# Patient Record
Sex: Female | Born: 1935
Health system: Southern US, Community
[De-identification: ages and names within clinical notes are randomized; demographics above are authoritative.]

## PROBLEM LIST (undated history)

## (undated) DIAGNOSIS — R0789 Other chest pain: Secondary | ICD-10-CM

## (undated) DIAGNOSIS — T4145XA Adverse effect of unspecified anesthetic, initial encounter: Secondary | ICD-10-CM

## (undated) DIAGNOSIS — I10 Essential (primary) hypertension: Secondary | ICD-10-CM

## (undated) DIAGNOSIS — R202 Paresthesia of skin: Secondary | ICD-10-CM

## (undated) DIAGNOSIS — K219 Gastro-esophageal reflux disease without esophagitis: Secondary | ICD-10-CM

## (undated) DIAGNOSIS — Z2989 Encounter for other specified prophylactic measures: Secondary | ICD-10-CM

## (undated) DIAGNOSIS — R011 Cardiac murmur, unspecified: Secondary | ICD-10-CM

## (undated) DIAGNOSIS — N904 Leukoplakia of vulva: Secondary | ICD-10-CM

## (undated) DIAGNOSIS — F419 Anxiety disorder, unspecified: Secondary | ICD-10-CM

## (undated) DIAGNOSIS — M810 Age-related osteoporosis without current pathological fracture: Secondary | ICD-10-CM

## (undated) DIAGNOSIS — J309 Allergic rhinitis, unspecified: Secondary | ICD-10-CM

## (undated) DIAGNOSIS — Z298 Encounter for other specified prophylactic measures: Secondary | ICD-10-CM

## (undated) DIAGNOSIS — T8859XA Other complications of anesthesia, initial encounter: Secondary | ICD-10-CM

## (undated) DIAGNOSIS — G250 Essential tremor: Secondary | ICD-10-CM

## (undated) HISTORY — DX: Leukoplakia of vulva: N90.4

## (undated) HISTORY — DX: Encounter for other specified prophylactic measures: Z29.8

## (undated) HISTORY — DX: Anxiety disorder, unspecified: F41.9

## (undated) HISTORY — DX: Paresthesia of skin: R20.2

## (undated) HISTORY — DX: Age-related osteoporosis without current pathological fracture: M81.0

## (undated) HISTORY — DX: Other chest pain: R07.89

## (undated) HISTORY — DX: Gastro-esophageal reflux disease without esophagitis: K21.9

## (undated) HISTORY — DX: Encounter for other specified prophylactic measures: Z29.89

## (undated) HISTORY — DX: Essential tremor: G25.0

## (undated) HISTORY — DX: Allergic rhinitis, unspecified: J30.9

## (undated) HISTORY — PX: OTHER SURGICAL HISTORY: SHX169

---

## 1966-04-01 HISTORY — PX: ABDOMINAL HYSTERECTOMY: SHX81

## 1967-04-02 DIAGNOSIS — Z90710 Acquired absence of both cervix and uterus: Secondary | ICD-10-CM | POA: Insufficient documentation

## 2001-04-01 HISTORY — PX: CORRECTION HAMMER TOE: SUR317

## 2002-04-02 LAB — HM COLONOSCOPY: HM COLON: NORMAL

## 2004-11-27 ENCOUNTER — Ambulatory Visit: Payer: Self-pay | Admitting: Family Medicine

## 2005-11-29 ENCOUNTER — Ambulatory Visit: Payer: Self-pay | Admitting: Family Medicine

## 2005-12-11 ENCOUNTER — Ambulatory Visit: Payer: Self-pay | Admitting: Family Medicine

## 2006-03-18 ENCOUNTER — Ambulatory Visit: Payer: Self-pay | Admitting: Family Medicine

## 2007-03-02 HISTORY — PX: ASD REPAIR, SECUNDUM: SHX1195

## 2007-07-21 ENCOUNTER — Ambulatory Visit: Payer: Self-pay | Admitting: Family Medicine

## 2008-08-15 DIAGNOSIS — R011 Cardiac murmur, unspecified: Secondary | ICD-10-CM | POA: Insufficient documentation

## 2008-08-15 DIAGNOSIS — I10 Essential (primary) hypertension: Secondary | ICD-10-CM | POA: Insufficient documentation

## 2008-08-19 DIAGNOSIS — M899 Disorder of bone, unspecified: Secondary | ICD-10-CM | POA: Insufficient documentation

## 2008-09-01 ENCOUNTER — Ambulatory Visit: Payer: Self-pay | Admitting: Family Medicine

## 2009-09-07 ENCOUNTER — Ambulatory Visit: Payer: Self-pay | Admitting: Family Medicine

## 2009-09-13 ENCOUNTER — Ambulatory Visit: Payer: Self-pay | Admitting: Family Medicine

## 2010-02-28 ENCOUNTER — Ambulatory Visit: Payer: Self-pay | Admitting: Family Medicine

## 2010-09-10 ENCOUNTER — Ambulatory Visit: Payer: Self-pay | Admitting: Family Medicine

## 2011-09-25 ENCOUNTER — Ambulatory Visit: Payer: Self-pay | Admitting: Family Medicine

## 2012-02-03 ENCOUNTER — Ambulatory Visit: Payer: Self-pay | Admitting: Family Medicine

## 2012-02-18 ENCOUNTER — Ambulatory Visit: Payer: Self-pay | Admitting: Family Medicine

## 2012-06-30 HISTORY — PX: CATARACT EXTRACTION: SUR2

## 2012-07-20 ENCOUNTER — Ambulatory Visit: Payer: Self-pay | Admitting: Ophthalmology

## 2012-07-20 LAB — POTASSIUM: Potassium: 3.8 mmol/L (ref 3.5–5.1)

## 2012-07-27 ENCOUNTER — Ambulatory Visit: Payer: Self-pay | Admitting: Ophthalmology

## 2012-08-05 ENCOUNTER — Ambulatory Visit: Payer: Self-pay | Admitting: Ophthalmology

## 2012-08-05 LAB — POTASSIUM: Potassium: 3.5 mmol/L (ref 3.5–5.1)

## 2012-08-10 ENCOUNTER — Ambulatory Visit: Payer: Self-pay | Admitting: Ophthalmology

## 2012-09-06 LAB — CBC AND DIFFERENTIAL: Platelets: 247 10*3/uL (ref 150–399)

## 2012-09-29 ENCOUNTER — Ambulatory Visit: Payer: Self-pay | Admitting: Family Medicine

## 2012-10-28 ENCOUNTER — Emergency Department: Payer: Self-pay | Admitting: Emergency Medicine

## 2012-10-28 LAB — URINALYSIS, COMPLETE
Bacteria: NONE SEEN
Bilirubin,UR: NEGATIVE
Ketone: NEGATIVE
Nitrite: NEGATIVE
Ph: 8 (ref 4.5–8.0)
Protein: NEGATIVE
Squamous Epithelial: <1
WBC UR: <1 /HPF (ref 0–5)

## 2012-10-28 LAB — COMPREHENSIVE METABOLIC PANEL
Albumin: 3.7 g/dL (ref 3.4–5.0)
Alkaline Phosphatase: 82 U/L (ref 50–136)
Anion Gap: 7 (ref 7–16)
BUN: 15 mg/dL (ref 7–18)
Bilirubin,Total: 0.6 mg/dL (ref 0.2–1.0)
Calcium, Total: 9.1 mg/dL (ref 8.5–10.1)
Chloride: 102 mmol/L (ref 98–107)
Co2: 27 mmol/L (ref 21–32)
Creatinine: 0.93 mg/dL (ref 0.60–1.30)
EGFR (African American): >60
EGFR (Non-African Amer.): 58 — ABNORMAL LOW
Glucose: 189 mg/dL — ABNORMAL HIGH (ref 65–99)
Osmolality: 278 (ref 275–301)
Potassium: 3.2 mmol/L — ABNORMAL LOW (ref 3.5–5.1)
SGOT(AST): 28 U/L (ref 15–37)
SGPT (ALT): 17 U/L (ref 12–78)
Sodium: 136 mmol/L (ref 136–145)
Total Protein: 7.3 g/dL (ref 6.4–8.2)

## 2012-10-28 LAB — CBC
HCT: 40.8 % (ref 35.0–47.0)
HGB: 14.1 g/dL (ref 12.0–16.0)
MCH: 31.7 pg (ref 26.0–34.0)
MCHC: 34.6 g/dL (ref 32.0–36.0)
Platelet: 255 10*3/uL (ref 150–440)
RBC: 4.45 10*6/uL (ref 3.80–5.20)
RDW: 13.1 % (ref 11.5–14.5)
WBC: 5 10*3/uL (ref 3.6–11.0)

## 2013-02-21 ENCOUNTER — Emergency Department: Payer: Self-pay | Admitting: Emergency Medicine

## 2013-02-21 LAB — URINALYSIS, COMPLETE
Bacteria: NONE SEEN
Bilirubin,UR: NEGATIVE
Blood: NEGATIVE
Glucose,UR: NEGATIVE mg/dL (ref 0–75)
Ph: 7 (ref 4.5–8.0)
Protein: NEGATIVE
RBC,UR: 1 /HPF (ref 0–5)
Specific Gravity: 1.008 (ref 1.003–1.030)
Squamous Epithelial: NONE SEEN
WBC UR: <1 /HPF (ref 0–5)

## 2013-02-21 LAB — COMPREHENSIVE METABOLIC PANEL
Alkaline Phosphatase: 79 U/L
BUN: 18 mg/dL (ref 7–18)
Co2: 27 mmol/L (ref 21–32)
Glucose: 141 mg/dL — ABNORMAL HIGH (ref 65–99)
Potassium: 3.6 mmol/L (ref 3.5–5.1)
SGOT(AST): 40 U/L — ABNORMAL HIGH (ref 15–37)
SGPT (ALT): 17 U/L (ref 12–78)

## 2013-02-21 LAB — CBC
HCT: 42.9 % (ref 35.0–47.0)
MCH: 31.4 pg (ref 26.0–34.0)
MCHC: 34.3 g/dL (ref 32.0–36.0)
Platelet: 240 10*3/uL (ref 150–440)
RBC: 4.69 10*6/uL (ref 3.80–5.20)
WBC: 4.3 10*3/uL (ref 3.6–11.0)

## 2013-02-21 LAB — TROPONIN I: Troponin-I: <0.02 ng/mL

## 2013-09-06 LAB — BASIC METABOLIC PANEL
BUN: 19 mg/dL (ref 4–21)
Creatinine: 0.9 mg/dL (ref <=1.1)
Glucose: 120 mg/dL
POTASSIUM: 3.8 mmol/L (ref 3.4–5.3)
Sodium: 143 mmol/L (ref 137–147)

## 2013-09-06 LAB — CBC AND DIFFERENTIAL
HEMATOCRIT: 42 % (ref 36–46)
Hemoglobin: 14.3 g/dL (ref 12.0–16.0)
WBC: 4.7 10^3/mL

## 2013-09-06 LAB — LIPID PANEL
CHOLESTEROL: 185 mg/dL (ref 0–200)
HDL: 54 mg/dL (ref 35–70)
LDL CALC: 120 mg/dL
Triglycerides: 57 mg/dL (ref 40–160)

## 2013-09-06 LAB — HEPATIC FUNCTION PANEL
ALT: 8 U/L (ref 7–35)
AST: 19 U/L (ref 13–35)

## 2013-09-27 LAB — HM DEXA SCAN

## 2013-10-03 LAB — HM MAMMOGRAPHY

## 2013-10-04 ENCOUNTER — Ambulatory Visit: Payer: Self-pay | Admitting: Family Medicine

## 2014-01-24 IMAGING — CR DG CHEST 2V
1 series · 2 of 2 positions shown · non-contrast
Comparison: none

REASON FOR EXAM: cough
COMMENTS:

PROCEDURE:     KDR - KDXR CHEST PA (OR AP) AND LAT  - February 03, 2012  [DATE]
RESULT:

[Series 1: pa · 0.17mm/px · 2 of 2 slices shown]
[im 1/2]
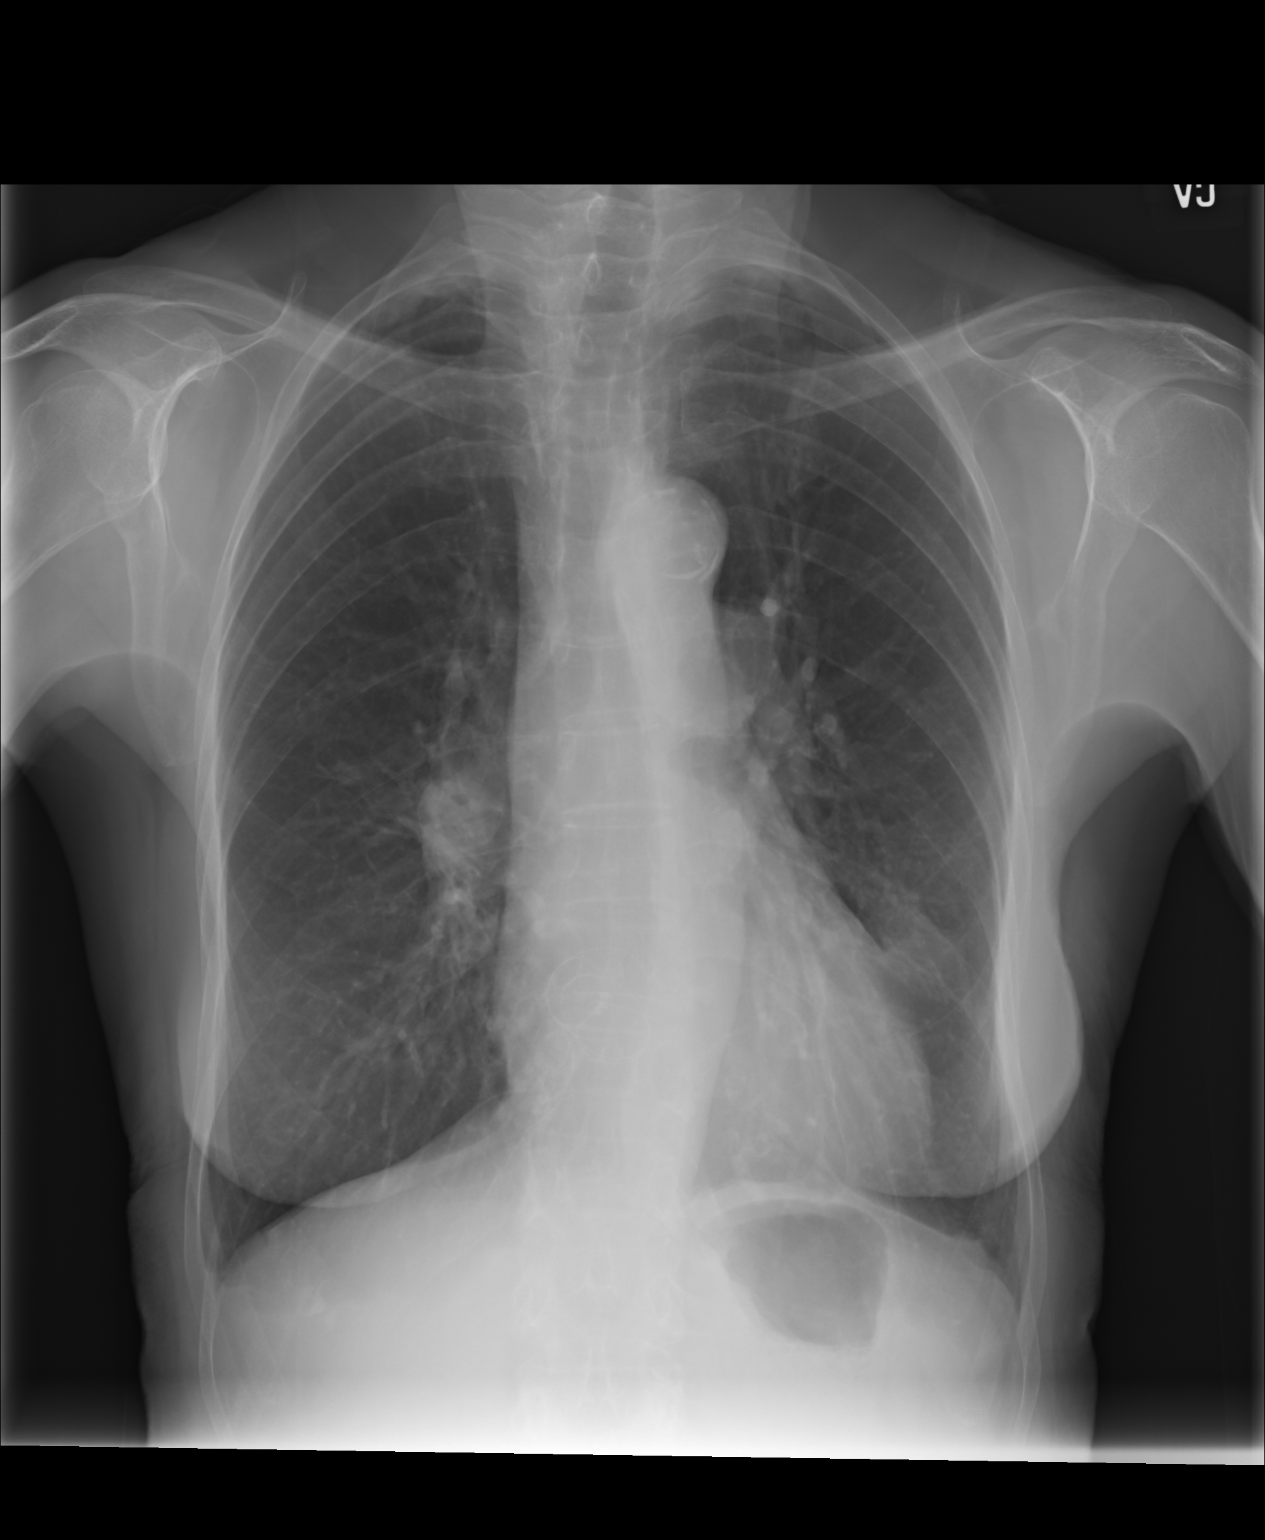
[im 2/2]
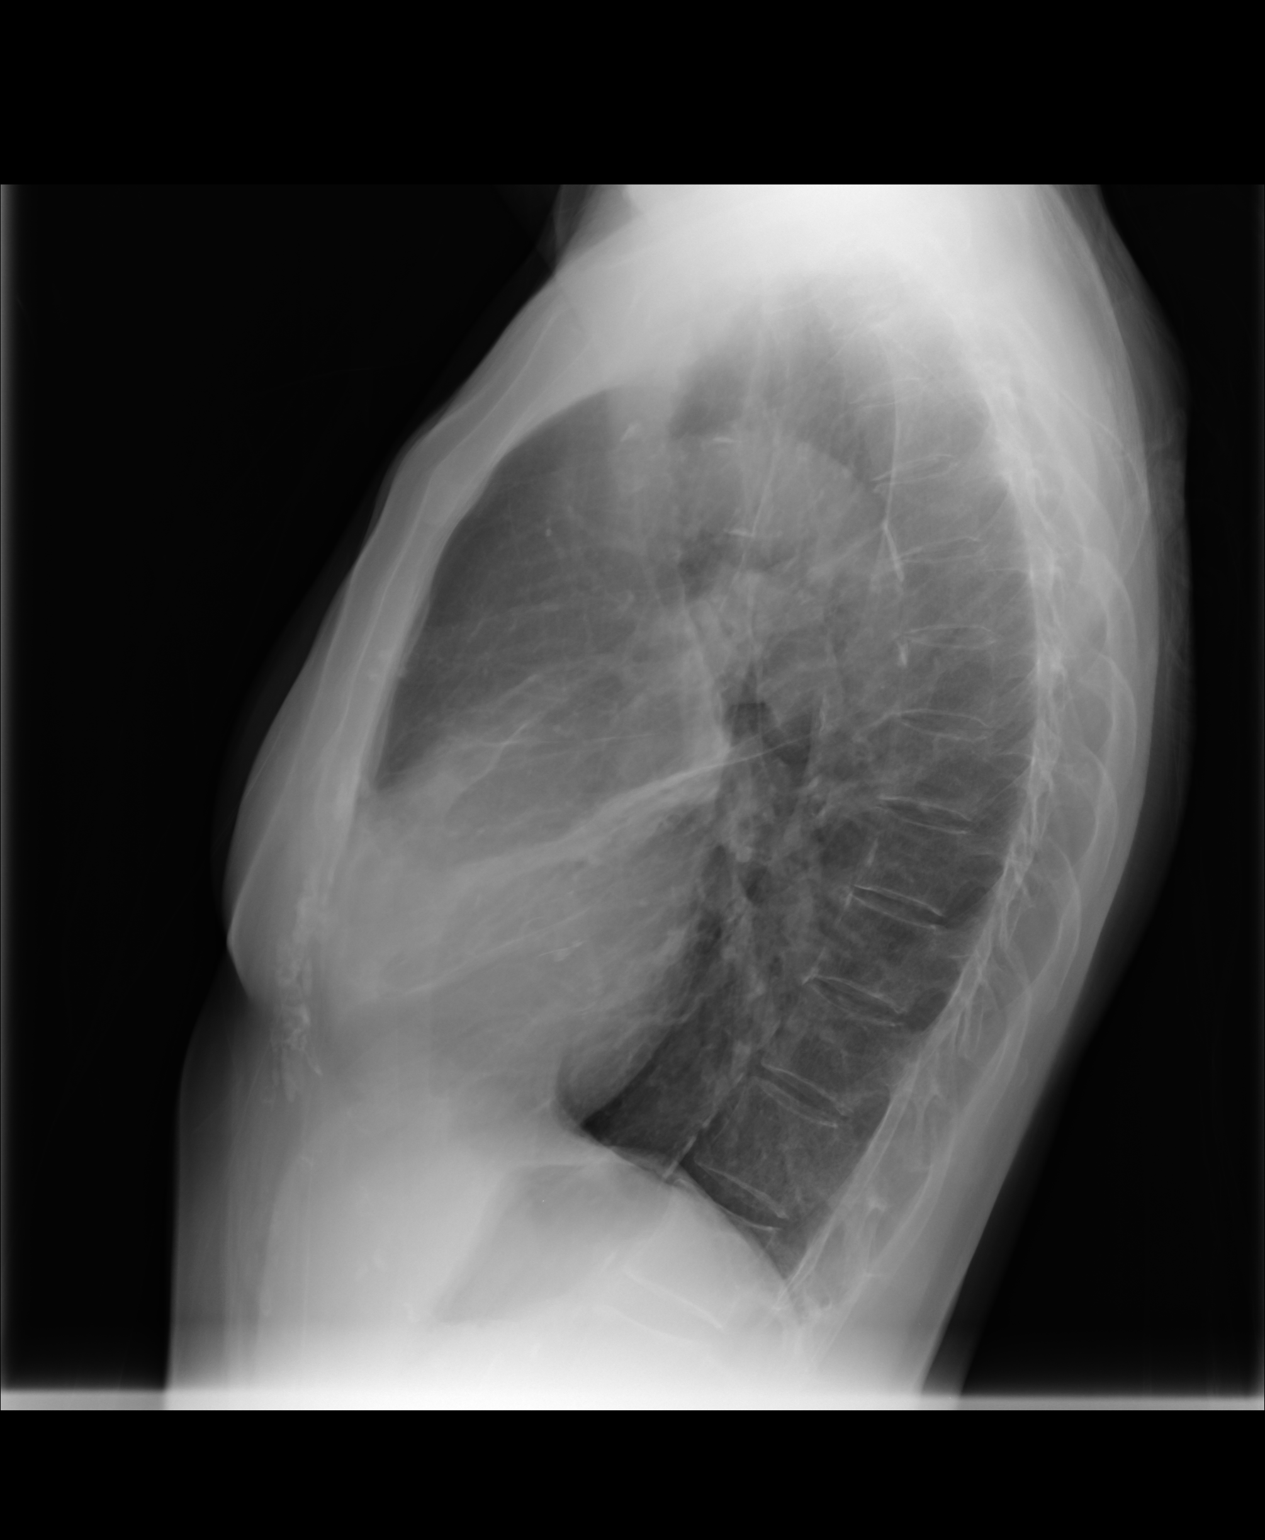

[2 of 2 positions shown; findings below may reference images not displayed]

FINDINGS: There is flattening of the hemidiaphragms. An area of nodular
increased density projects within the right hilar region. There is
prominence of the pulmonary artery on the left. An area of increased density
projects in the region of the lingula. The cardiac silhouette is within
normal limits. The visualized bony skeleton is unremarkable.
IMPRESSION: 1. Area of increased nodular density projecting in the right hilar region.
This may represent prominent vasculature though adenopathy or mass cannot be
excluded.
2. Atelectasis versus infiltrate in the region of the lingula. A repeat
evaluation in 10 to 14 days or sooner is recommended. If these findings
persists status post appropriate therapeutic regimen, further evaluation
with chest CT is recommended. In the absence of clinical signs of infection
or inflammatory etiologies, Chest CT is recommended.

## 2014-02-02 LAB — TSH: TSH: 1.23 u[IU]/mL (ref <=5.90)

## 2014-02-02 LAB — HEMOGLOBIN A1C: HEMOGLOBIN A1C: 5.4 % (ref 4.0–6.0)

## 2014-07-21 LAB — HM PAP SMEAR

## 2014-07-22 DIAGNOSIS — F419 Anxiety disorder, unspecified: Secondary | ICD-10-CM | POA: Insufficient documentation

## 2014-07-22 DIAGNOSIS — G250 Essential tremor: Secondary | ICD-10-CM | POA: Insufficient documentation

## 2014-07-22 DIAGNOSIS — J309 Allergic rhinitis, unspecified: Secondary | ICD-10-CM | POA: Insufficient documentation

## 2014-07-22 DIAGNOSIS — K219 Gastro-esophageal reflux disease without esophagitis: Secondary | ICD-10-CM | POA: Insufficient documentation

## 2014-07-22 DIAGNOSIS — M810 Age-related osteoporosis without current pathological fracture: Secondary | ICD-10-CM | POA: Insufficient documentation

## 2014-07-22 NOTE — Op Note (Signed)
PATIENT NAME:  Susan NeighborsHOMPSON, Susan P MR#:  [id number] DATE OF BIRTH:  15-Jul-1930  DATE OF PROCEDURE:  07/27/2012  PREOPERATIVE DIAGNOSIS:  Cataract, left eye.    POSTOPERATIVE DIAGNOSIS:  Cataract, left eye.  PROCEDURE PERFORMED:  Extracapsular cataract extraction using phacoemulsification with placement of an Alcon [id number], 26.0-diopter posterior chamber lens, serial [id number].  SURGEON:  Maylon PeppersSteven A. Kelani Robart, MD  ASSISTANT:  None.  ANESTHESIA:  4% lidocaine and 0.75% Marcaine in a 50/50 mixture with 10 units/mL of Hylenex added, given as a peribulbar.   ANESTHESIOLOGIST:  Dr. Pernell DupreAdams.  COMPLICATIONS:  None.  ESTIMATED BLOOD LOSS:  Less than 1 ml.  DESCRIPTION OF PROCEDURE:  The patient was brought to the operating room and given a peribulbar block.  The patient was then prepped and draped in the usual fashion.  The vertical rectus muscles were imbricated using 5-0 silk sutures.  These sutures were then clamped to the sterile drapes as bridle sutures.  A limbal peritomy was performed extending two clock hours and hemostasis was obtained with cautery.  A partial thickness scleral groove was made at the surgical limbus and dissected anteriorly in a lamellar dissection using an Alcon crescent knife.  The anterior chamber was entered supero-temporally with a Superblade and through the lamellar dissection with a 2.6 mm keratome.  DisCoVisc was used to replace the aqueous and a continuous tear capsulorrhexis was carried out.  Hydrodissection and hydrodelineation were carried out with balanced salt and a 27 gauge canula.  The nucleus was rotated to confirm the effectiveness of the hydrodissection.  Phacoemulsification was carried out using a divide-and-conquer technique.  Total ultrasound time was 46 seconds with an average power of 23.8 percent and CDE of 20.76.  Irrigation/aspiration was used to remove the residual cortex.  DisCoVisc was used to inflate the capsule and the internal incision was  enlarged to 3 mm with the crescent knife.  The intraocular lens was folded and inserted into the capsular bag using the AcrySert delivery system. Irrigation/aspiration was used to remove the residual DisCoVisc.  Miostat was injected into the anterior chamber through the paracentesis track to inflate the anterior chamber and induce miosis.  A tenth of a milliliter of cefuroxime was placed into the anterior chamber via the paracentesis tract. The wound was checked for leaks and none were found. The conjunctiva was closed with cautery and the bridle sutures were removed.  Two drops of 0.3% Vigamox were placed on the eye.   An eye shield was placed on the eye.  The patient was discharged to the recovery room in good condition. ____________________________ Maylon PeppersSteven A. Lailynn Southgate, MD sad:sb D: 07/27/2012 14:03:13 ET T: 07/27/2012 14:45:04 ET JOB#: [id number]  cc: Viviann SpareSteven A. Yazeed Pryer, MD, <Dictator> Erline LevineSTEVEN A Brees Hounshell MD ELECTRONICALLY SIGNED 08/03/2012 9:47

## 2014-07-22 NOTE — Op Note (Signed)
PATIENT NAME:  Susan NeighborsHOMPSON, Lynnex P MR#:  [id number] DATE OF BIRTH:  1931-03-21  DATE OF PROCEDURE:  08/10/2012  PREOPERATIVE DIAGNOSIS: Cataract, right eye.   POSTOPERATIVE DIAGNOSIS: Cataract, right eye.   PROCEDURE PERFORMED: Extracapsular cataract extraction using phacoemulsification with placement Alcon [id number], 23.0 diopter posterior chamber lens, serial #[id number].   ANESTHESIA: 4% lidocaine and 0.75% Marcaine in a 50-50 mixture with 10 units per mL of Hylenex added, given as a peribulbar.   SURGEON: Dr. Viviann SpareSteven A. Laityn Bensen.  ANESTHESIOLOGIST: Dr. Henrene HawkingKephart.   COMPLICATIONS: None.   ESTIMATED BLOOD LOSS: Less than 1 mL.   DESCRIPTION OF PROCEDURE:  The patient was brought to the operating room and given a peribulbar block.  The patient was then prepped and draped in the usual fashion.  The vertical rectus muscles were imbricated using 5-0 silk sutures.  These sutures were then clamped to the sterile drapes as bridle sutures.  A limbal peritomy was performed extending two clock hours and hemostasis was obtained with cautery.  A partial thickness scleral groove was made at the surgical limbus and dissected anteriorly in a lamellar dissection using an Alcon crescent knife.  The anterior chamber was entered superonasally with a Superblade and through the lamellar dissection with a 2.6 mm keratome.  DisCoVisc was used to replace the aqueous and a continuous tear capsulorrhexis was carried out.  Hydrodissection and hydrodelineation were carried out with balanced salt and a 27 gauge canula.  The nucleus was rotated to confirm the effectiveness of the hydrodissection.  Phacoemulsification was carried out using a divide-and-conquer technique.  Total ultrasound time was 1 minute and 4 seconds with an average power of 23.6 percent.  CDE 27.96.  Irrigation/aspiration was used to remove the residual cortex.  DisCoVisc was used to inflate the capsule and the internal incision was enlarged to 3 mm  with the crescent knife.  The intraocular lens was folded and inserted into the capsular bag using the AcrySert delivery system.  Irrigation/aspiration was used to remove the residual DisCoVisc.  Miostat was injected into the anterior chamber through the paracentesis track to inflate the anterior chamber and induce miosis.  A tenth of a mL of cefuroxime was injected through the paracentesis tract.  The wound was checked for leaks and none were found. The conjunctiva was closed with cautery and the bridle sutures were removed.  Two drops of 0.3% Vigamox were placed on the eye.   An eye shield was placed on the eye.  The patient was discharged to the recovery room in good condition.    ____________________________ Maylon PeppersSteven A. Iyari Hagner, MD sad:es D: 08/10/2012 14:47:44 ET T: 08/11/2012 08:49:26 ET JOB#: [id number]  cc: Viviann SpareSteven A. Adabella Stanis, MD, <Dictator> Erline LevineSTEVEN A Khaleesi Gruel MD ELECTRONICALLY SIGNED 08/12/2012 11:53

## 2014-08-26 DIAGNOSIS — K5730 Diverticulosis of large intestine without perforation or abscess without bleeding: Secondary | ICD-10-CM

## 2014-08-26 DIAGNOSIS — R0789 Other chest pain: Secondary | ICD-10-CM | POA: Insufficient documentation

## 2014-08-26 DIAGNOSIS — R202 Paresthesia of skin: Secondary | ICD-10-CM | POA: Insufficient documentation

## 2014-08-31 ENCOUNTER — Encounter: Payer: Self-pay | Admitting: Obstetrics and Gynecology

## 2014-08-31 ENCOUNTER — Ambulatory Visit (INDEPENDENT_AMBULATORY_CARE_PROVIDER_SITE_OTHER): Payer: Medicare Other | Admitting: Obstetrics and Gynecology

## 2014-08-31 VITALS — BP 160/79 | HR 76 | Wt 112.0 lb

## 2014-08-31 DIAGNOSIS — N904 Leukoplakia of vulva: Secondary | ICD-10-CM | POA: Diagnosis not present

## 2014-08-31 NOTE — Patient Instructions (Signed)
1. Continue with Temovate ointment 0.05% twice a day 2 weeks, followed by daily application 4 weeks.  2. RTC 6 weeks for recheck  3. Cotton underwear.  4. Blow dry, perineum

## 2014-08-31 NOTE — Progress Notes (Signed)
Subjective:Patient presents today for 6 week follow-up on lichen sclerosus of the perineum.  She has noted decreased itching still has a burning and sedation. She has completed 2 weeks of twice a day application followed by 4 weeks of daily application of the Temovate ointment.  Objective: BP 160/79 mmHg  Pulse 76  Wt 112 lb (50.803 kg)    General appearance: alert, cooperative and no distress Pelvic exam: External genitalia-Faint leukoplakia in the periclitoral region and involving the labia majora bilaterally close to the perineal body.  No excoriation or epithelial skin breakdown.  There is atrophy of the labia minora with agglutination to the labia majora. BUS-normal Vagina-not examined.  Assessment: Lichen sclerosus with minimal improvement.  Plan:  1. Continue with Temovate ointment 0.05% twice a day 2 weeks, followed by daily application 4 weeks. 2.  RTC 6 weeks for recheck 3.  Cotton underwear. 4. Blow dry, perineum

## 2014-09-09 ENCOUNTER — Ambulatory Visit (INDEPENDENT_AMBULATORY_CARE_PROVIDER_SITE_OTHER): Payer: Medicare Other | Admitting: Family Medicine

## 2014-09-09 ENCOUNTER — Encounter: Payer: Self-pay | Admitting: Family Medicine

## 2014-09-09 VITALS — BP 158/82 | HR 64 | Temp 97.6°F | Resp 16 | Ht 64.0 in | Wt 111.0 lb

## 2014-09-09 DIAGNOSIS — Z0000 Encounter for general adult medical examination without abnormal findings: Secondary | ICD-10-CM

## 2014-09-09 DIAGNOSIS — E876 Hypokalemia: Secondary | ICD-10-CM

## 2014-09-09 DIAGNOSIS — T148 Other injury of unspecified body region: Secondary | ICD-10-CM | POA: Diagnosis not present

## 2014-09-09 DIAGNOSIS — N904 Leukoplakia of vulva: Secondary | ICD-10-CM | POA: Insufficient documentation

## 2014-09-09 DIAGNOSIS — I10 Essential (primary) hypertension: Secondary | ICD-10-CM | POA: Diagnosis not present

## 2014-09-09 DIAGNOSIS — W57XXXA Bitten or stung by nonvenomous insect and other nonvenomous arthropods, initial encounter: Secondary | ICD-10-CM

## 2014-09-09 DIAGNOSIS — J309 Allergic rhinitis, unspecified: Secondary | ICD-10-CM

## 2014-09-09 MED ORDER — POTASSIUM CHLORIDE CRYS ER 20 MEQ PO TBCR: 20.0000 meq | EXTENDED_RELEASE_TABLET | Freq: Once | ORAL | Status: DC

## 2014-09-09 NOTE — Progress Notes (Signed)
Patient ID: Susan Booth, female   DOB: April 24, 1930, 81 y.o.   MRN: [id number]  Patient: Susan Booth, Female    DOB: 1931-03-06, 81 y.o.   MRN: [id number] Visit Date: 09/09/2014  Today's Provider: Lorie Phenix, MD   Chief Complaint  Patient presents with  . Annual Exam   Subjective:    Annual wellness visit Susan Booth is a 81 y.o. female who presents today for her Subsequent Annual Wellness Visit. She feels well with minor complaints.  She is concerned of a small spot on her back that is itchy and red.  She first noticed it about two weeks ago and reports it is improving but is still a little itchy.  She reports she is not exercising much, but she keeps herself busy. She reports she is sleeping well.  -----------------------------------------------------------   Review of Systems  Constitutional: Negative.   HENT: Negative.   Eyes: Positive for itching.  Respiratory: Negative.   Cardiovascular: Negative.   Gastrointestinal: Negative.   Endocrine: Negative.   Genitourinary: Negative.   Musculoskeletal: Negative.   Skin: Negative.   Allergic/Immunologic: Negative.   Neurological: Positive for tremors.  Hematological: Negative.   Psychiatric/Behavioral: Negative.     History   Social History  . Marital Status: Married    Spouse Name: Sherrine Maples  . Number of Children: 2  . Years of Education: College   Occupational History  . Retired    Social History Main Topics  . Smoking status: Never Smoker   . Smokeless tobacco: Never Used  . Alcohol Use: No  . Drug Use: No  . Sexual Activity: No   Other Topics Concern  . Not on file   Social History Narrative    Patient Active Problem List   Diagnosis Date Noted  . Leukoplakia of vulva 09/09/2014  . Lichen sclerosus of female genitalia 09/09/2014  . Hypokalemia 09/09/2014  . Lichen sclerosus et atrophicus of the vulva   . Atypical chest pain 08/26/2014  . Paresthesia 08/26/2014  . Allergic rhinitis  07/22/2014  . Anxiety 07/22/2014  . Benign essential tremor 07/22/2014  . Acid reflux 07/22/2014  . OP (osteoporosis) 07/22/2014  . Bone/cartilage disorder 08/19/2008  . Nonrheumatic tricuspid valve disorder 08/19/2008  . Undiagnosed cardiac murmurs 08/15/2008  . Colon, diverticulosis 08/15/2008  . Benign hypertension 08/15/2008  . Primary pulmonary hypertension 08/15/2008  . Heart murmur 08/15/2008  . H/O total hysterectomy 04/02/1967    Past Surgical History  Procedure Laterality Date  . Abdominal hysterectomy  1968  . Cataract extraction Bilateral 06/2012  . Asd repair, secundum  03/2007  . Correction hammer toe Bilateral 2003    Her family history includes Breast cancer in her maternal aunt; COPD in her mother; Early death in her mother; Heart attack in her father; Heart disease in her father and sister; Lung cancer in her mother.    Previous Medications   ASPIRIN 81 MG TABLET    ASPIRIN EC,  (Oral Tablet Delayed Release)  1 Every Day for 0 days  Quantity: 0.00;  Refills: 0   Ordered :12-Jan-2010  [name] ;  Started 15-Aug-2008 Active Comments: DX: 401.1   CALCIUM CARBONATE-VITAMIN D 600-400 MG-UNIT PER TABLET    CALTRATE 600+D, 600-400MG -UNIT (Oral Tablet)  1 Every Day for 0 days  Quantity: 0.00;  Refills: 0   Ordered :12-Jan-2010  [name] ;  Started 15-Aug-2008 Active Comments: DX: 733.90   CLOBETASOL CREAM (TEMOVATE) 0.05 %    Apply 1 application topically daily.  FLUTICASONE (FLONASE) 50 MCG/ACT NASAL SPRAY    Place into the nose.   LORAZEPAM (ATIVAN) 0.5 MG TABLET    Take by mouth.   POTASSIUM CHLORIDE SA (K-DUR,KLOR-CON) 20 MEQ TABLET    Take by mouth.   VALSARTAN-HYDROCHLOROTHIAZIDE (DIOVAN-HCT) 160-12.5 MG PER TABLET    Take by mouth.    Patient Care Team: Lorie Phenix, MD as PCP - General (Family Medicine)     Objective:   Vitals: BP 158/82 mmHg  Pulse 64  Temp(Src) 97.6 F (36.4 C) (Oral)  Resp 16  Ht 5\' 4"  (1.626 m)  Wt 111 lb (50.349 kg)   BMI 19.04 kg/m2  Physical Exam  Constitutional: She is oriented to person, place, and time. She appears well-developed and well-nourished.  HENT:  Head: Normocephalic and atraumatic.  Right Ear: Hearing, tympanic membrane, external ear and ear canal normal.  Left Ear: Hearing, tympanic membrane, external ear and ear canal normal.  Nose: Nose normal.  Mouth/Throat: Uvula is midline, oropharynx is clear and moist and mucous membranes are normal.  Eyes: Conjunctivae, EOM and lids are normal. Pupils are equal, round, and reactive to light.  Neck: Trachea normal, normal range of motion and full passive range of motion without pain. Neck supple.  Cardiovascular: Normal rate, regular rhythm and normal pulses.   Murmur heard.  Systolic murmur is present with a grade of 2/6  Pulmonary/Chest: Effort normal and breath sounds normal. Right breast exhibits no inverted nipple, no mass, no nipple discharge, no skin change and no tenderness. Left breast exhibits no inverted nipple, no mass, no nipple discharge, no skin change and no tenderness. Breasts are symmetrical.  Abdominal: Soft. Normal appearance and normal aorta. There is no tenderness.  Musculoskeletal: Normal range of motion.  Neurological: She is alert and oriented to person, place, and time.  Skin: Skin is warm, dry and intact.  59mm erythematous skin lesion on right side of her back.    Psychiatric: She has a normal mood and affect. Judgment normal.    Activities of Daily Living In your present state of health, do you have any difficulty performing the following activities: 09/09/2014  Hearing? N  Vision? N  Difficulty concentrating or making decisions? N  Walking or climbing stairs? N  Dressing or bathing? N  Doing errands, shopping? N    Fall Risk Assessment Fall Risk  09/09/2014  Falls in the past year? No     Depression Screen PHQ 2/9 Scores 09/09/2014  PHQ - 2 Score 0    Assessment & Plan:     Annual Wellness Visit      Healthy, check routine labs, and ordered Mammogram.    Reviewed patient's Family Medical History Reviewed and updated list of patient's medical providers Assessed patient's functional ability Established a written schedule for health screening services Health Risk Assessment Completed and Reviewed  Exercise Activities and Dietary recommendations Goals    None      Immunization History  Administered Date(s) Administered  . Influenza-Unspecified 01/05/2014  . Pneumococcal Conjugate-13 09/06/2013  . Pneumococcal Polysaccharide-23 02/08/2004  . Td 07/12/2003  . Zoster 09/29/2010    Health Maintenance  Topic Date Due  . COLONOSCOPY  04/02/2012  . TETANUS/TDAP  07/11/2013  . INFLUENZA VACCINE  10/31/2014  . DEXA SCAN  Completed  . ZOSTAVAX  Completed  . PNA vac Low Risk Adult  Completed      Discussed health benefits of physical activity, and encouraged her to engage in regular exercise appropriate for her age and  condition.   2. Benign hypertension Stable, will check labs.   - COMPLETE METABOLIC PANEL WITH GFR - Lipid panel - TSH  3. Allergic rhinitis, unspecified allergic rhinitis type Stable, only uses Flonase prn  - CBC w/Diff  4. Hypokalemia Stable, Refills provided.   - potassium chloride SA (K-DUR,KLOR-CON) 20 MEQ tablet; Take 1 tablet (20 mEq total) by mouth once.  Dispense: 30 tablet; Refill: 11  5. Tick bite Suspect.  Advised pt about symptoms of tick fever.  Pt will call right away if she has any of these symptoms.     Patient was seen and examined by Leo Grosser, MD, and note scribed by Kavin Leech, CMA.   I have reviewed the document for accuracy and completeness and I agree with above. Leo Grosser, MD   Lorie Phenix, MD  -----------------------------------------------------------------------------------------------------------

## 2014-09-10 LAB — CBC WITH DIFFERENTIAL/PLATELET
Basophils Absolute: 0 10*3/uL (ref 0.0–0.2)
Basos: 1 %
EOS (ABSOLUTE): 0.1 10*3/uL (ref 0.0–0.4)
EOS: 3 %
Hematocrit: 44 % (ref 34.0–46.6)
Hemoglobin: 15 g/dL (ref 11.1–15.9)
Immature Grans (Abs): 0 10*3/uL (ref 0.0–0.1)
Immature Granulocytes: 0 %
LYMPHS ABS: 1.6 10*3/uL (ref 0.7–3.1)
Lymphs: 32 %
MCH: 31 pg (ref 26.6–33.0)
MCHC: 34.1 g/dL (ref 31.5–35.7)
MCV: 91 fL (ref 79–97)
MONOCYTES: 6 %
Monocytes Absolute: 0.3 10*3/uL (ref 0.1–0.9)
NEUTROS PCT: 58 %
Neutrophils Absolute: 2.8 10*3/uL (ref 1.4–7.0)
Platelets: 281 10*3/uL (ref 150–379)
RBC: 4.84 x10E6/uL (ref 3.77–5.28)
RDW: 13.1 % (ref 12.3–15.4)
WBC: 4.8 10*3/uL (ref 3.4–10.8)

## 2014-09-10 LAB — LIPID PANEL
CHOL/HDL RATIO: 3.3 ratio (ref 0.0–4.4)
Cholesterol, Total: 184 mg/dL (ref 100–199)
HDL: 56 mg/dL (ref >39)
LDL CALC: 117 mg/dL — AB (ref 0–99)
Triglycerides: 53 mg/dL (ref 0–149)
VLDL CHOLESTEROL CAL: 11 mg/dL (ref 5–40)

## 2014-09-10 LAB — TSH: TSH: 1.24 u[IU]/mL (ref 0.450–4.500)

## 2014-09-13 ENCOUNTER — Telehealth: Payer: Self-pay

## 2014-09-13 LAB — CMP14+EGFR
ALT: 11 [IU]/L (ref 0–32)
AST: 22 [IU]/L (ref 0–40)
Albumin/Globulin Ratio: 1.8 (ref 1.1–2.5)
Albumin: 4.6 g/dL (ref 3.5–4.7)
Alkaline Phosphatase: 73 [IU]/L (ref 39–117)
BUN/Creatinine Ratio: 18 (ref 11–26)
BUN: 15 mg/dL (ref 8–27)
Bilirubin Total: 0.6 mg/dL (ref 0.0–1.2)
CO2: 24 mmol/L (ref 18–29)
Calcium: 10 mg/dL (ref 8.7–10.3)
Chloride: 100 mmol/L (ref 97–108)
Creatinine, Ser: 0.83 mg/dL (ref 0.57–1.00)
GFR calc Af Amer: 76 mL/min/{1.73_m2}
GFR calc non Af Amer: 66 mL/min/{1.73_m2}
Globulin, Total: 2.6 g/dL (ref 1.5–4.5)
Glucose: 101 mg/dL — ABNORMAL HIGH (ref 65–99)
Potassium: 4 mmol/L (ref 3.5–5.2)
Sodium: 142 mmol/L (ref 134–144)
Total Protein: 7.2 g/dL (ref 6.0–8.5)

## 2014-09-13 LAB — SPECIMEN STATUS REPORT

## 2014-09-13 NOTE — Telephone Encounter (Signed)
-----   Message from Lorie Phenix, MD sent at 09/13/2014  2:10 PM EDT ----- Labs normal. Please notify patient. Thanks.

## 2014-09-13 NOTE — Telephone Encounter (Signed)
Pt advised as directed below.   Thanks,   -Samar Dass  

## 2014-09-27 ENCOUNTER — Telehealth: Payer: Self-pay | Admitting: Family Medicine

## 2014-09-27 DIAGNOSIS — E876 Hypokalemia: Secondary | ICD-10-CM

## 2014-09-27 MED ORDER — POTASSIUM CHLORIDE CRYS ER 20 MEQ PO TBCR
20.0000 meq | EXTENDED_RELEASE_TABLET | Freq: Two times a day (BID) | ORAL | Status: DC
Start: 2014-09-27 — End: 2015-10-24

## 2014-09-27 NOTE — Telephone Encounter (Signed)
Patient reports that she is not sure if you meant to change her potassium chloride 20 MEQ to once daily instead of her usual 20 MEQ two times daily.

## 2014-09-27 NOTE — Telephone Encounter (Signed)
Patient advised as below.  

## 2014-09-27 NOTE — Telephone Encounter (Signed)
Pt would like a nurse to return her call because she wasn't told that the dosage on her Potassium was going to change put when she got the medication it was for a different dose. Thanks TNP

## 2014-09-27 NOTE — Telephone Encounter (Signed)
Sent new rx. Was not supposed to be changed.   Do need to have met c rechecked however.  Maybe at next ov.  Thanks.

## 2014-10-11 ENCOUNTER — Ambulatory Visit
Admission: RE | Admit: 2014-10-11 | Discharge: 2014-10-11 | Disposition: A | Discharge status: Home / Self Care | Payer: Medicare Other | Source: Ambulatory Visit | Attending: Family Medicine | Admitting: Family Medicine

## 2014-10-11 DIAGNOSIS — Z1231 Encounter for screening mammogram for malignant neoplasm of breast: Secondary | ICD-10-CM | POA: Diagnosis present

## 2014-10-11 DIAGNOSIS — Z0000 Encounter for general adult medical examination without abnormal findings: Secondary | ICD-10-CM | POA: Diagnosis present

## 2014-10-12 ENCOUNTER — Encounter: Payer: Self-pay | Admitting: Obstetrics and Gynecology

## 2014-10-12 ENCOUNTER — Ambulatory Visit (INDEPENDENT_AMBULATORY_CARE_PROVIDER_SITE_OTHER): Payer: Medicare Other | Admitting: Obstetrics and Gynecology

## 2014-10-12 VITALS — BP 166/75 | HR 74 | Ht 64.0 in | Wt 111.3 lb

## 2014-10-12 DIAGNOSIS — R21 Rash and other nonspecific skin eruption: Secondary | ICD-10-CM | POA: Diagnosis not present

## 2014-10-12 MED ORDER — NYSTATIN-TRIAMCINOLONE 100000-0.1 UNIT/GM-% EX CREA: 1.0000 "application " | TOPICAL_CREAM | Freq: Two times a day (BID) | CUTANEOUS | Status: DC

## 2014-10-12 NOTE — Patient Instructions (Signed)
1. Use Nystatin/Triamcinolone cream twice a day for 2 weeks. 2. Hold off on using the clobetasol cream. 3. Return in 2 weeks.

## 2014-10-12 NOTE — Progress Notes (Signed)
Patient ID: Bing NeighborsCarolyn P Haverland, female   DOB: 09/27/1930, 81 y.o.   MRN: [id number]  S: 6 week f/u LS  temovate bid 2 weeks Now daily- last time used Monday Rectum area is burning/red worse- when use temovate   O:  Vulva - atrophy; no leukoplakia Perineum-Perianal inflammation and ? Satellite lesions  A:  1. H/O LSA, stable 2. Perianal inflammation, ? Yeast  P: 1.  Hold Temovate 2.  Nystatin/Triamcinolone cream BID x 2 weeks 3.  F/U 2weeks

## 2014-10-26 ENCOUNTER — Ambulatory Visit (INDEPENDENT_AMBULATORY_CARE_PROVIDER_SITE_OTHER): Payer: Medicare Other | Admitting: Obstetrics and Gynecology

## 2014-10-26 ENCOUNTER — Encounter: Payer: Self-pay | Admitting: Obstetrics and Gynecology

## 2014-10-26 ENCOUNTER — Telehealth: Payer: Self-pay

## 2014-10-26 VITALS — BP 178/81 | HR 71 | Ht 64.0 in | Wt 111.4 lb

## 2014-10-26 DIAGNOSIS — I10 Essential (primary) hypertension: Secondary | ICD-10-CM | POA: Diagnosis not present

## 2014-10-26 DIAGNOSIS — L900 Lichen sclerosus et atrophicus: Secondary | ICD-10-CM | POA: Diagnosis not present

## 2014-10-26 DIAGNOSIS — B379 Candidiasis, unspecified: Secondary | ICD-10-CM | POA: Diagnosis not present

## 2014-10-26 NOTE — Telephone Encounter (Signed)
Dr. Santiago Bur office returns call. Appointment made for 3:45p tomorrow (10/27/2014).  Pt notified of appt and they ask that she be there at 3:30 to get updates and to bring ins card and drivers license.

## 2014-10-26 NOTE — Patient Instructions (Signed)
1. May use Mycolog for 1 more week around the rectum for resolving irritation from yeast. 2. Restart the Temovate ointment (Clobetasol) every other day for 4 weeks, then 2 times a week for 4 weeks. 3. Return in 2 months for recheck.

## 2014-10-26 NOTE — Progress Notes (Signed)
Patient ID: Susan Booth, female   DOB: 02-Mar-1931, 81 y.o.   MRN: [id number] S: 2 week f/u LS and Perianal Moniloia infection Myccolog BID 2 weeks completed. Rectal irritation improved  O:  BP 178/81 mmHg  Pulse 71  Ht  (1.626 m)  Wt 111 lb 7 oz (50.548 kg)  BMI 19.12 kg/m2  Vulva - atrophy; faint  Leukoplakia-periclitoral Perineum-Perianal inflammation and Satellite lesions  A:  1. H/O LSA, stable 2. Perianal inflammation, ? Yeast, resolved  P: 1.  Continue Mycolog twice a day for 1 week to Perianal region. 2.  Resume Temovate ointment every other day 1 month, followed by twice weekly application 1 month 3.  Return 2 months 4. F/U with Dr. Elease Hashimoto regarding BP-48 hours

## 2014-10-26 NOTE — Telephone Encounter (Signed)
Please call patient and schedule appointment in next few days.

## 2014-10-26 NOTE — Telephone Encounter (Signed)
Pt's B/P 176/78 P-71 on todays' office visit. Taken again after completion of visit and B/P 178/81. Denies any dizziness, h/a, nausea, blurred vision. Pt does state that she hasn't felt well since yesterday. Describe as lethargic. Encourage pt to take B/P 2xd and to see PCP.  Left message with Dr. Santiago Bur office to contact office. Pt. Available anytime today and Thurs except am. Best contact number to reach pt [phone].

## 2014-10-27 ENCOUNTER — Ambulatory Visit (INDEPENDENT_AMBULATORY_CARE_PROVIDER_SITE_OTHER): Payer: Medicare Other | Admitting: Family Medicine

## 2014-10-27 ENCOUNTER — Encounter: Payer: Self-pay | Admitting: Family Medicine

## 2014-10-27 VITALS — BP 172/80 | HR 70 | Temp 98.4°F | Resp 16 | Wt 112.4 lb

## 2014-10-27 DIAGNOSIS — F419 Anxiety disorder, unspecified: Secondary | ICD-10-CM | POA: Diagnosis not present

## 2014-10-27 DIAGNOSIS — I10 Essential (primary) hypertension: Secondary | ICD-10-CM

## 2014-10-27 DIAGNOSIS — N904 Leukoplakia of vulva: Secondary | ICD-10-CM | POA: Diagnosis not present

## 2014-10-27 DIAGNOSIS — G250 Essential tremor: Secondary | ICD-10-CM | POA: Diagnosis not present

## 2014-10-27 MED ORDER — AMLODIPINE BESYLATE 2.5 MG PO TABS: 2.5000 mg | ORAL_TABLET | Freq: Every day | ORAL | Status: DC

## 2014-10-27 NOTE — Progress Notes (Signed)
Patient ID: Susan Booth, female   DOB: 03-10-1931, 81 y.o.   MRN: [id number]       Patient: Susan Booth Female    DOB: 1930/10/07   81 y.o.   MRN: [id number] Visit Date: 10/27/2014  Today's Provider: Lorie Phenix, MD   Chief Complaint  Patient presents with  . Hypertension    b/p was 176/78 and 178/81 at Dr D's office yesterday.   Subjective:    Hypertension This is a chronic (never this high" pt stated) problem. Progression since onset: have not been taking my b/p at home in six weeks" pt stated. Associated symptoms include malaise/fatigue and neck pain. (Did not feel well last week, hard to describe" pt stated I could not get my rings off last week" pt stated "Neck pain last week." ) Associated agents: the nystatin-triamcinolone cream, used it very little" pt stated. Compliance problems: try to watch the salt" pt stated.    Seeing Dr. Greggory Keen for lichen sclerosis.       No Known Allergies Previous Medications   ASPIRIN 81 MG TABLET    ASPIRIN EC,  (Oral Tablet Delayed Release)  1 Every Day for 0 days  Quantity: 0.00;  Refills: 0   Ordered :12-Jan-2010  Susan Booth ;  Started 15-Aug-2008 Active Comments: DX: 401.1   CALCIUM CARBONATE-VITAMIN D 600-400 MG-UNIT PER TABLET    CALTRATE 600+D, 600-400MG -UNIT (Oral Tablet)  1 Every Day for 0 days  Quantity: 0.00;  Refills: 0   Ordered :12-Jan-2010  Susan Booth ;  Started 15-Aug-2008 Active Comments: DX: 733.90   CLOBETASOL CREAM (TEMOVATE) 0.05 %    Apply 1 application topically daily.   FLUTICASONE (FLONASE) 50 MCG/ACT NASAL SPRAY    Place into the nose.   LORAZEPAM (ATIVAN) 0.5 MG TABLET    Take by mouth.   NYSTATIN-TRIAMCINOLONE (MYCOLOG II) CREAM    Apply 1 application topically 2 (two) times daily.   POTASSIUM CHLORIDE SA (K-DUR,KLOR-CON) 20 MEQ TABLET    Take 1 tablet (20 mEq total) by mouth 2 (two) times daily.   VALSARTAN-HYDROCHLOROTHIAZIDE (DIOVAN-HCT) 160-12.5 MG PER TABLET    Take by mouth.    Review  of Systems  Constitutional: Positive for malaise/fatigue.  Musculoskeletal: Positive for neck pain.    History  Substance Use Topics  . Smoking status: Never Smoker   . Smokeless tobacco: Never Used  . Alcohol Use: No   Objective:   BP 172/80 mmHg  Pulse 70  Temp(Src) 98.4 F (36.9 C) (Oral)  Resp 16  Wt 112 lb 6.4 oz (50.984 kg)  Physical Exam  Constitutional: She is oriented to person, place, and time. She appears well-developed and well-nourished.  Cardiovascular: Normal rate and regular rhythm.   Pulmonary/Chest: Effort normal and breath sounds normal.  Other than occasional scattered rhonchi.   Neurological: She is alert and oriented to person, place, and time.  Psychiatric: She has a normal mood and affect.      Assessment & Plan:     1. Benign hypertension Not at goal. Will start Amlodipine and recheck in 4 weeks. Call if any worsening symptoms.  - amLODipine (NORVASC) 2.5 MG tablet; Take 1 tablet (2.5 mg total) by mouth daily.  Dispense: 90 tablet; Refill: 3  2. Lichen sclerosus et atrophicus of the vulva Followed by Gyn. Scheduled for follow up.   Feels better about this.   3. Benign essential tremor Stable.   4. Anxiety Stable. Doing well, now that less worried about Lichen sclerosus.  Lorie Phenix, MD  [facility]

## 2014-11-23 ENCOUNTER — Other Ambulatory Visit: Payer: Self-pay

## 2014-11-23 MED ORDER — CLOBETASOL PROPIONATE 0.05 % EX CREA: 1.0000 "application " | TOPICAL_CREAM | CUTANEOUS | Status: DC

## 2014-11-25 ENCOUNTER — Ambulatory Visit (INDEPENDENT_AMBULATORY_CARE_PROVIDER_SITE_OTHER): Payer: Medicare Other | Admitting: Family Medicine

## 2014-11-25 ENCOUNTER — Encounter: Payer: Self-pay | Admitting: Family Medicine

## 2014-11-25 VITALS — BP 128/72 | HR 80 | Temp 97.9°F | Resp 16 | Ht 64.0 in | Wt 112.0 lb

## 2014-11-25 DIAGNOSIS — I10 Essential (primary) hypertension: Secondary | ICD-10-CM | POA: Diagnosis not present

## 2014-11-25 DIAGNOSIS — R002 Palpitations: Secondary | ICD-10-CM | POA: Diagnosis not present

## 2014-11-25 NOTE — Progress Notes (Signed)
Patient ID: Susan Booth, female   DOB: 1930/12/17, 81 y.o.   MRN: [id number]         Patient: Susan Booth Female    DOB: December 09, 1930   81 y.o.   MRN: [id number] Visit Date: 11/25/2014  Today's Provider: Lorie Phenix, MD   Chief Complaint  Patient presents with  . Hypertension   Subjective:    HPI Comments: Pt was here about four weeks ago for elevated blood pressure.  She started Amlodipine 2.5mg  a day.  She reports tolerating it well until last week when she says she started feeling bad.  She was very fatigued, unsteady on her feet lightheaded, she also had a cough and some chest tightness.  She says she is feeling much better this week and was thinking maybe she picked up a virus.  She does check her blood pressure at home and states the reading are okay for the most part,  She has had a few low readings as well as some high readings.  She would like to know more about "irregular heart rate" she states that she has had that reading several times on her blood pressure cuff.  She reports that she is "VERY concern about having a stroke."  Hypertension This is a chronic problem. The problem has been gradually improving since onset. The problem is controlled. Associated symptoms include anxiety, neck pain (Only when she has a headache) and palpitations. Pertinent negatives include no chest pain, headaches, malaise/fatigue, shortness of breath or sweats. There are no compliance problems.    Does notice that she has an irregular heart beat at time on her monitor.     No Known Allergies Previous Medications   AMLODIPINE (NORVASC) 2.5 MG TABLET    Take 1 tablet (2.5 mg total) by mouth daily.   ASPIRIN 81 MG TABLET    ASPIRIN EC,  (Oral Tablet Delayed Release)  1 Every Day for 0 days  Quantity: 0.00;  Refills: 0   Ordered :12-Jan-2010  [name] ;  Started 15-Aug-2008 Active Comments: DX: 401.1   CALCIUM CARBONATE-VITAMIN D 600-400 MG-UNIT PER TABLET    CALTRATE 600+D,  600-400MG -UNIT (Oral Tablet)  1 Every Day for 0 days  Quantity: 0.00;  Refills: 0   Ordered :12-Jan-2010  [name] ;  Started 15-Aug-2008 Active Comments: DX: 733.90   CLOBETASOL CREAM (TEMOVATE) 0.05 %    Apply 1 application topically 2 (two) times a week.   FLUTICASONE (FLONASE) 50 MCG/ACT NASAL SPRAY    Place into the nose.   LORAZEPAM (ATIVAN) 0.5 MG TABLET    Take by mouth.   POTASSIUM CHLORIDE SA (K-DUR,KLOR-CON) 20 MEQ TABLET    Take 1 tablet (20 mEq total) by mouth 2 (two) times daily.   VALSARTAN-HYDROCHLOROTHIAZIDE (DIOVAN-HCT) 160-12.5 MG PER TABLET    Take by mouth.    Review of Systems  Constitutional: Negative.  Negative for malaise/fatigue.  Respiratory: Negative.  Negative for shortness of breath.   Cardiovascular: Positive for palpitations. Negative for chest pain.  Gastrointestinal: Negative.   Musculoskeletal: Positive for arthralgias (Right elbow pain.) and neck pain (Only when she has a headache). Negative for myalgias, back pain, joint swelling and neck stiffness.  Neurological: Negative.  Negative for headaches.    Social History  Substance Use Topics  . Smoking status: Never Smoker   . Smokeless tobacco: Never Used  . Alcohol Use: No   Objective:   BP 128/72 mmHg  Pulse 80  Temp(Src) 97.9 F (36.6 C) (Oral)  Resp 16  Ht 5\' 4"  (1.626 m)  Wt 112 lb (50.803 kg)  BMI 19.22 kg/m2  Physical Exam  Constitutional: She is oriented to person, place, and time. She appears well-developed and well-nourished.  Cardiovascular: Normal rate and regular rhythm.   Pulmonary/Chest: Effort normal and breath sounds normal.  Neurological: She is alert and oriented to person, place, and time.  Psychiatric: She has a normal mood and affect. Her behavior is normal. Judgment and thought content normal.      Assessment & Plan:     1. Benign hypertension Stable. Continue current medication.    2. Heart palpitations Worsening. Notices it at home, can see it on her BP  monitor. Will wear Holter monitor and press button when notices it or sees it on monitor.   Further plan pending these results.   - EKG 12-Lead - 24 hour holter monitor; Standing      Lorie Phenix, MD  [facility]

## 2015-01-03 ENCOUNTER — Encounter: Payer: Self-pay | Admitting: Obstetrics and Gynecology

## 2015-01-03 ENCOUNTER — Ambulatory Visit (INDEPENDENT_AMBULATORY_CARE_PROVIDER_SITE_OTHER): Payer: Medicare Other | Admitting: Obstetrics and Gynecology

## 2015-01-03 VITALS — BP 144/69 | HR 73 | Ht 64.0 in | Wt 112.4 lb

## 2015-01-03 DIAGNOSIS — L900 Lichen sclerosus et atrophicus: Secondary | ICD-10-CM

## 2015-01-03 NOTE — Progress Notes (Signed)
GYN ENCOUNTER NOTE  Subjective:       Susan Booth is a 80 y.o. G67P2002 female is here for gynecologic evaluation of the following issues:  1. Lichen sclerosis-patient notes minimal itching of her perineum.  She is applying clobetasol ointment twice a week.  She denies any history of vaginal discharge.     Gynecologic History No LMP recorded. Patient is postmenopausal. Contraception: post menopausal status   Obstetric History OB History  Gravida Para Term Preterm AB SAB TAB Ectopic Multiple Living  # Outcome Date GA Lbr Len/2nd Weight Sex Delivery Anes PTL Lv  2 Term 04/01/56 [redacted]w[redacted]d  6 lb 8 oz (2.948 kg) M Vag-Spont   Y  1 Term 04/02/55 [redacted]w[redacted]d  6 lb 1 oz (2.75 kg) M Vag-Spont   Y      Past Medical History  Diagnosis Date  . Benign essential tremor   . Anxiety   . Need for SBE (subacute bacterial endocarditis) prophylaxis   . Other chest pain   . Gastroesophageal reflux disease   . Lichen sclerosus et atrophicus of the vulva   . Osteoporosis, post-menopausal   . Paresthesia   . Rhinitis, allergic     Past Surgical History  Procedure Laterality Date  . Abdominal hysterectomy  1968  . Cataract extraction Bilateral 06/2012  . Asd repair, secundum  03/2007  . Correction hammer toe Bilateral 2003    Current Outpatient Prescriptions on File Prior to Visit  Medication Sig Dispense Refill  . amLODipine (NORVASC) 2.5 MG tablet Take 1 tablet (2.5 mg total) by mouth daily. 90 tablet 3  . aspirin 81 MG tablet ASPIRIN EC,  (Oral Tablet Delayed Release)  1 Every Day for 0 days  Quantity: 0.00;  Refills: 0   Ordered :12-Jan-2010  [name] ;  Started 15-Aug-2008 Active Comments: DX: 401.1    . Calcium Carbonate-Vitamin D 600-400 MG-UNIT per tablet CALTRATE 600+D, 600-400MG -UNIT (Oral Tablet)  1 Every Day for 0 days  Quantity: 0.00;  Refills: 0   Ordered :12-Jan-2010  [name] ;  Started 15-Aug-2008 Active Comments: DX: 733.90    . clobetasol cream  (TEMOVATE) 0.05 % Apply 1 application topically 2 (two) times a week. 30 g 2  . fluticasone (FLONASE) 50 MCG/ACT nasal spray Place into the nose.    [name] LORazepam (ATIVAN) 0.5 MG tablet Take by mouth.    . potassium chloride SA (K-DUR,KLOR-CON) 20 MEQ tablet Take 1 tablet (20 mEq total) by mouth 2 (two) times daily. 60 tablet 11  . valsartan-hydrochlorothiazide (DIOVAN-HCT) 160-12.5 MG per tablet Take by mouth.     No current facility-administered medications on file prior to visit.    No Known Allergies  Social History   Social History  . Marital Status: Married    Spouse Name: Sherrine Maples  . Number of Children: 2  . Years of Education: College   Occupational History  . Retired    Social History Main Topics  . Smoking status: Never Smoker   . Smokeless tobacco: Never Used  . Alcohol Use: No  . Drug Use: No  . Sexual Activity: No   Other Topics Concern  . Not on file   Social History Narrative    Family History  Problem Relation Age of Onset  . COPD Mother   . Early death Mother   . Lung cancer Mother   . Heart disease Father   . Heart attack Father   .  Heart disease Sister     heart valve problem  . Breast cancer Maternal Aunt     The following portions of the patient's history were reviewed and updated as appropriate: allergies, current medications, past family history, past medical history, past social history, past surgical history and problem list.  Review of Systems Review of Systems - General ROS: negative for - chills, fatigue, fever, hot flashes, malaise or night sweats Hematological and Lymphatic ROS: negative for - bleeding problems or swollen lymph nodes Gastrointestinal ROS: negative for - abdominal pain, blood in stools, change in bowel habits and nausea/vomiting Musculoskeletal ROS: negative for - joint pain, muscle pain or muscular weakness Genito-Urinary ROS: negative for - change in menstrual cycle, dysmenorrhea, dyspareunia, dysuria, genital discharge,  genital ulcers, hematuria, incontinence, irregular/heavy menses, nocturia or pelvic pain  Objective:   BP 144/69 mmHg  Pulse 73  Ht  (1.626 m)  Wt 112 lb 6.4 oz (50.984 kg)  BMI 19.28 kg/m2 CONSTITUTIONAL: Well-developed, well-nourished female in no acute distress.  HENT:  Normocephalic, atraumatic.  SKIN: Skin is warm and dry. No rash noted. Not diaphoretic. No erythema. No pallor. NEUROLGIC: Alert and oriented to person, place, and time. PSYCHIATRIC: Normal mood and affect. Normal behavior. Normal judgment and thought content. CARDIOVASCULAR:Not Examined RESPIRATORY: Not Examined BREASTS: Not Examined PELVIC:  External Genitalia: Mild atrophic changes; thin area of leukoplakia and introitus 2 mm x 10 mm on the left side; multiple chair hemangiomas noted on the vulva; no rash  BUS: Normal  Vagina: Atrophic  Cervix: Surgically absent  Uterus: Surgically absent  Adnexa: Not assessed  RV: Normal External exam  Bladder: Nontender    Assessment:   1. Lichen sclerosus, Stable     Plan:   1.  Continue with clobetasol ointment 0.05% topically twice a week. 2.  Return in 6 months for follow-up or sooner if symptoms recur.  Herold Harms, MD

## 2015-01-03 NOTE — Progress Notes (Signed)
Patient ID: Susan Booth, female   DOB: 12-28-1930, 81 y.o.   MRN: [id number] 2 month f/u on LS Burn and itch at times Using clobetsol twice a week Perianal rash- gone

## 2015-01-03 NOTE — Patient Instructions (Signed)
1.  Continue the clobetasol ointment twice a week. 2.  Return in 6 months for follow-up or sooner as needed if symptoms recur.

## 2015-02-21 ENCOUNTER — Other Ambulatory Visit: Payer: Self-pay

## 2015-02-21 DIAGNOSIS — I10 Essential (primary) hypertension: Secondary | ICD-10-CM

## 2015-02-21 MED ORDER — VALSARTAN-HYDROCHLOROTHIAZIDE 160-12.5 MG PO TABS: 1.0000 | ORAL_TABLET | Freq: Every day | ORAL | Status: DC

## 2015-02-21 NOTE — Telephone Encounter (Signed)
Pharmacy requesting refill. Last ov was 11/25/2014. Med last refilled on 08/16/2014 #30 with 5 refills.

## 2015-04-11 ENCOUNTER — Other Ambulatory Visit: Payer: Self-pay

## 2015-04-11 DIAGNOSIS — F419 Anxiety disorder, unspecified: Secondary | ICD-10-CM

## 2015-04-11 MED ORDER — LORAZEPAM 0.5 MG PO TABS: ORAL_TABLET | ORAL | Status: DC

## 2015-04-11 NOTE — Telephone Encounter (Signed)
Printed, please fax or call in to pharmacy. Thank you.   

## 2015-04-12 NOTE — Telephone Encounter (Signed)
RX called in at [facility] pharmacy  

## 2015-04-14 ENCOUNTER — Telehealth: Payer: Self-pay

## 2015-04-14 NOTE — Telephone Encounter (Signed)
Mr. Janee Mornhompson concerned about wife having sx of a numb left hand. Pt also experiencing weakness of the hand, and a possible "anxiety attack" (sx of shaking and not being able to talk). Denies chest pain, SOB, H/A. Sx have been going on for about 20 minutes. Advised husband to take pt to ED per Dr. Elease HashimotoMaloney, since sx aren't typical of pt's panic attacks. Allene DillonEmily Drozdowski, CMA

## 2015-07-04 ENCOUNTER — Ambulatory Visit (INDEPENDENT_AMBULATORY_CARE_PROVIDER_SITE_OTHER): Payer: Medicare Other | Admitting: Obstetrics and Gynecology

## 2015-07-04 ENCOUNTER — Encounter: Payer: Self-pay | Admitting: Obstetrics and Gynecology

## 2015-07-04 VITALS — BP 148/67 | HR 76 | Ht 64.0 in | Wt 112.9 lb

## 2015-07-04 DIAGNOSIS — L290 Pruritus ani: Secondary | ICD-10-CM | POA: Insufficient documentation

## 2015-07-04 DIAGNOSIS — L900 Lichen sclerosus et atrophicus: Secondary | ICD-10-CM

## 2015-07-04 NOTE — Patient Instructions (Signed)
1. Increase use the Temovate ointment to every 2 day application for 4 weeks; then assess for 4 weeks decrease the frequency to twice weekly application. 2. Return in 8 weeks for follow-up

## 2015-07-04 NOTE — Progress Notes (Signed)
Chief complaint: 1. Lichen sclerosus  Bing NeighborsCarolyn P Thang is a 82 y.o. 762P2002 female is here for gynecologic evaluation of the following issues:  1. Lichen sclerosis She is applying clobetasol ointment twice a week. She denies any history of vaginal discharge. Patient notes increased itching around her rectum.  Past Medical History  Diagnosis Date  . Benign essential tremor   . Anxiety   . Need for SBE (subacute bacterial endocarditis) prophylaxis   . Other chest pain   . Gastroesophageal reflux disease   . Lichen sclerosus et atrophicus of the vulva   . Osteoporosis, post-menopausal   . Paresthesia   . Rhinitis, allergic     OBJECTIVE: BP 148/67 mmHg  Pulse 76  Ht 5\' 4"  (1.626 m)  Wt 112 lb 14.4 oz (51.211 kg)  BMI 19.37 kg/m2 PELVIC: External Genitalia: Mild atrophic changes; no leukoplakia multiple Cherry hemangiomas noted on the vulva; no rash BUS: Normal Vagina: Atrophic Cervix: Surgically absent Uterus: Surgically absent Adnexa: Not assessed RV: Normal External exam; no significant perianal inflammation or skin lesions Bladder: Nontender  ASSESSMENT: 1.  Lichen sclerosus-increased irritation perianally without evidence of abnormality  PLAN: 1. Increase clobetasol ointment to every 2 days 4 weeks; then decrease to twice weekly application 2. Return in 8 weeks for follow-up  A total of 15 minutes were spent face-to-face with the patient during this encounter and over half of that time dealt with counseling and coordination of care.  Herold HarmsMartin A Arlyce Circle, MD  Note: This dictation was prepared with Dragon dictation along with smaller phrase technology. Any transcriptional errors that result from this process are unintentional.

## 2015-07-25 ENCOUNTER — Other Ambulatory Visit: Payer: Self-pay | Admitting: Family Medicine

## 2015-08-24 ENCOUNTER — Other Ambulatory Visit: Payer: Self-pay | Admitting: Family Medicine

## 2015-08-24 DIAGNOSIS — I10 Essential (primary) hypertension: Secondary | ICD-10-CM

## 2015-08-24 NOTE — Telephone Encounter (Signed)
CPE scheduled for 09/11/2015. Allene DillonEmily Drozdowski, CMA

## 2015-09-05 ENCOUNTER — Ambulatory Visit (INDEPENDENT_AMBULATORY_CARE_PROVIDER_SITE_OTHER): Payer: Medicare Other | Admitting: Obstetrics and Gynecology

## 2015-09-05 ENCOUNTER — Encounter: Payer: Self-pay | Admitting: Obstetrics and Gynecology

## 2015-09-05 VITALS — BP 138/78 | HR 76 | Ht 64.0 in | Wt 109.1 lb

## 2015-09-05 DIAGNOSIS — L290 Pruritus ani: Secondary | ICD-10-CM | POA: Diagnosis not present

## 2015-09-05 DIAGNOSIS — L900 Lichen sclerosus et atrophicus: Secondary | ICD-10-CM | POA: Diagnosis not present

## 2015-09-05 NOTE — Progress Notes (Signed)
Chief complaint: 1. Lichen sclerosus  Patient presents for 2 month follow-up on lichen sclerosus. She has been using the Temovate ointment every other day for 1 month followed by twice weekly application. She states that she notes more irritation along the left side of the rectum as well as at the introitus. She is not overly rubbing the area but does pad it dry after showering.  OBJECTIVE: BP 138/78 mmHg  Pulse 76  Ht 5\' 4"  (1.626 m)  Wt 109 lb 1.6 oz (49.487 kg)  BMI 18.72 kg/m2 Pelvic: External genitalia-atrophic changes with minimal epithelial skin breakdown on left side of introitus along with associated mild hyperemia and minimal leukoplakia; multiple serrated hemangiomas in the vulva BUS-normal RV-slight hyperemia left greater than right; no satellite lesions or epithelial skin breakdown  ASSESSMENT: Lichen sclerosus with perianal irritation, left-sided greater than right  PLAN: 1. Increase clobetasol ointment to the vulva and perianal region every other day for 2 months 2. Return in 2 months for follow-up 3. Consider blow drying the perineum after showering 4. Encourage cotton underwear  A total of 15 minutes were spent face-to-face with the patient during this encounter and over half of that time dealt with counseling and coordination of care.  Herold HarmsMartin A Casanova Schurman, MD  Note: This dictation was prepared with Dragon dictation along with smaller phrase technology. Any transcriptional errors that result from this process are unintentional.

## 2015-09-05 NOTE — Patient Instructions (Signed)
1. Continue Temovate ointment to the affected area every other day for 2 months 2. Consider blow drying the perineum after showering 3. Return in 2 months for follow-up

## 2015-09-11 ENCOUNTER — Encounter: Payer: Self-pay | Admitting: Family Medicine

## 2015-09-11 ENCOUNTER — Ambulatory Visit (INDEPENDENT_AMBULATORY_CARE_PROVIDER_SITE_OTHER): Payer: Medicare Other | Admitting: Family Medicine

## 2015-09-11 VITALS — BP 130/64 | HR 80 | Temp 97.6°F | Resp 16 | Ht 64.0 in | Wt 110.2 lb

## 2015-09-11 DIAGNOSIS — Z126 Encounter for screening for malignant neoplasm of bladder: Secondary | ICD-10-CM | POA: Diagnosis not present

## 2015-09-11 DIAGNOSIS — R252 Cramp and spasm: Secondary | ICD-10-CM | POA: Diagnosis not present

## 2015-09-11 DIAGNOSIS — Z0000 Encounter for general adult medical examination without abnormal findings: Secondary | ICD-10-CM | POA: Diagnosis not present

## 2015-09-11 DIAGNOSIS — Z1231 Encounter for screening mammogram for malignant neoplasm of breast: Secondary | ICD-10-CM | POA: Diagnosis not present

## 2015-09-11 DIAGNOSIS — I10 Essential (primary) hypertension: Secondary | ICD-10-CM

## 2015-09-11 LAB — POCT URINALYSIS DIPSTICK
BILIRUBIN UA: NEGATIVE
GLUCOSE UA: NEGATIVE
KETONES UA: NEGATIVE
Leukocytes, UA: NEGATIVE
NITRITE UA: NEGATIVE
PH UA: 6
Protein, UA: NEGATIVE
RBC UA: NEGATIVE
SPEC GRAV UA: 1.015
Urobilinogen, UA: 0.2

## 2015-09-11 NOTE — Progress Notes (Signed)
Patient: Susan Booth, Female    DOB: 26-Apr-1930, 82 y.o.   MRN: [id number] Visit Date: 09/11/2015  Today's Provider: Lorie Phenix, MD   Chief Complaint  Patient presents with  . Medicare Wellness  . Hypertension   Subjective:    Annual wellness visit Susan Booth is a 82 y.o. female. She feels well. She reports exercising not regularly. Does participate in yard work. She reports she is sleeping fairly well.  Last AWE- 09/09/2014 Last Pap- S/P Hysterectomy in 1968. Last Mammogram - 10/11/2014- BI-RADS 1 Last BMD- 09/27/2013- osteoporosis Last Colonoscopy- 04/02/2002- WNL. -----------------------------------------------------------    Hypertension, follow-up:  BP Readings from Last 3 Encounters:  09/11/15 130/64  09/05/15 138/78  07/04/15 148/67    She was last seen for hypertension 10 months ago.  BP at that visit was 128/72. Management since that visit includes none. She reports excellent compliance with treatment. She is having side effects. Feeling weak; also c/o leg and toe cramps.  She is not exercising. She is adherent to low salt diet.   Outside blood pressures are ranging from 97-137/65-85. She is experiencing fatigue.  Patient denies chest pain, claudication, dyspnea, irregular heart beat, lower extremity edema, near-syncope, orthopnea, palpitations and syncope.   Cardiovascular risk factors include advanced age (older than 77 for men, 19 for women), family history of premature cardiovascular disease and hypertension.    Weight trend: stable Wt Readings from Last 3 Encounters:  09/11/15 110 lb 3.2 oz (49.986 kg)  09/05/15 109 lb 1.6 oz (49.487 kg)  07/04/15 112 lb 14.4 oz (51.211 kg)    Current diet: in general, a "healthy" diet    ------------------------------------------------------------------------     Review of Systems  Constitutional: Positive for fatigue.  Musculoskeletal: Positive for myalgias (cramps).  All other  systems reviewed and are negative.   Social History   Social History  . Marital Status: Married    Spouse Name: Sherrine Maples  . Number of Children: 2  . Years of Education: College   Occupational History  . Retired    Social History Main Topics  . Smoking status: Never Smoker   . Smokeless tobacco: Never Used  . Alcohol Use: No  . Drug Use: No  . Sexual Activity: No   Other Topics Concern  . Not on file   Social History Narrative    Past Medical History  Diagnosis Date  . Benign essential tremor   . Anxiety   . Need for SBE (subacute bacterial endocarditis) prophylaxis   . Other chest pain   . Gastroesophageal reflux disease   . Lichen sclerosus et atrophicus of the vulva   . Osteoporosis, post-menopausal   . Paresthesia   . Rhinitis, allergic      Patient Active Problem List   Diagnosis Date Noted  . Lichen sclerosus 07/04/2015  . Rectal itching 07/04/2015  . Heart palpitations 11/25/2014  . Leukoplakia of vulva 09/09/2014  . Hypokalemia 09/09/2014  . Lichen sclerosus et atrophicus of the vulva   . Atypical chest pain 08/26/2014  . Paresthesia 08/26/2014  . Allergic rhinitis 07/22/2014  . Anxiety 07/22/2014  . Benign essential tremor 07/22/2014  . Acid reflux 07/22/2014  . OP (osteoporosis) 07/22/2014  . Bone/cartilage disorder 08/19/2008  . Nonrheumatic tricuspid valve disorder 08/19/2008  . Undiagnosed cardiac murmurs 08/15/2008  . Colon, diverticulosis 08/15/2008  . Benign hypertension 08/15/2008  . Primary pulmonary hypertension (HCC) 08/15/2008  . Heart murmur 08/15/2008  . H/O total hysterectomy 04/02/1967  Past Surgical History  Procedure Laterality Date  . Abdominal hysterectomy  1968  . Cataract extraction Bilateral 06/2012  . Asd repair, secundum  03/2007  . Correction hammer toe Bilateral 2003    Her family history includes Breast cancer in her maternal aunt; COPD in her mother; Early death in her mother; Heart attack in her father;  Heart disease in her father and sister; Lung cancer in her mother.    Current Meds  Medication Sig  . amLODipine (NORVASC) 2.5 MG tablet Take 1 tablet (2.5 mg total) by mouth daily.  [name]. aspirin 81 MG tablet ASPIRIN EC, 81MG  (Oral Tablet Delayed Release)  1 Every Day for 0 days  Quantity: 0.00;  Refills: 0   Ordered :12-Jan-2010  Merleen NicelyHaith, Tammy ;  Started 15-Aug-2008 Active Comments: DX: 401.1  . Calcium Carbonate-Vitamin D 600-400 MG-UNIT per tablet CALTRATE 600+D, 600-400MG -UNIT (Oral Tablet)  1 Every Day for 0 days  Quantity: 0.00;  Refills: 0   Ordered :12-Jan-2010  Merleen NicelyHaith, Tammy ;  Started 15-Aug-2008 Active Comments: DX: 733.90  . clobetasol cream (TEMOVATE) 0.05 % Apply 1 application topically 2 (two) times a week.  . fluticasone (FLONASE) 50 MCG/ACT nasal spray Place into the nose.  [name]. LORazepam (ATIVAN) 0.5 MG tablet Take one tablet twice a day; and then once every six hours as needed.  . potassium chloride SA (K-DUR,KLOR-CON) 20 MEQ tablet Take 1 tablet (20 mEq total) by mouth 2 (two) times daily.  . valsartan-hydrochlorothiazide (DIOVAN-HCT) 160-12.5 MG tablet TAKE ONE (1) TABLET BY MOUTH EVERY DAY    Patient Care Team: Lorie PhenixNancy Anapaula Severt, MD as PCP - General (Family Medicine)   .MEDST  Objective:   Vitals: BP 130/64 mmHg  Pulse 80  Temp(Src) 97.6 F (36.4 C) (Oral)  Resp 16  Ht 5\' 4"  (1.626 m)  Wt 110 lb 3.2 oz (49.986 kg)  BMI 18.91 kg/m2  Physical Exam  Constitutional: She is oriented to person, place, and time. She appears well-developed and well-nourished.  HENT:  Head: Normocephalic and atraumatic.  Right Ear: Tympanic membrane, external ear and ear canal normal.  Left Ear: Tympanic membrane, external ear and ear canal normal.  Nose: Nose normal.  Mouth/Throat: Uvula is midline, oropharynx is clear and moist and mucous membranes are normal.  Eyes: Conjunctivae, EOM and lids are normal. Pupils are equal, round, and reactive to light.  Neck: Trachea normal and normal range  of motion. Neck supple. Carotid bruit is not present. No thyroid mass and no thyromegaly present.  Cardiovascular: Normal rate, regular rhythm and normal heart sounds.   Pulmonary/Chest: Effort normal and breath sounds normal.  Abdominal: Soft. Normal appearance and bowel sounds are normal. There is no hepatosplenomegaly. There is no tenderness.  Genitourinary: No breast swelling, tenderness or discharge.  Musculoskeletal: Normal range of motion.  Lymphadenopathy:    She has no cervical adenopathy.    She has no axillary adenopathy.  Neurological: She is alert and oriented to person, place, and time. She has normal strength. No cranial nerve deficit.  Skin: Skin is warm, dry and intact.  Psychiatric: She has a normal mood and affect. Her speech is normal and behavior is normal. Judgment and thought content normal. Cognition and memory are normal.    Activities of Daily Living In your present state of health, do you have any difficulty performing the following activities: 09/11/2015  Hearing? Y  Vision? N  Difficulty concentrating or making decisions? N  Walking or climbing stairs? N  Dressing or bathing? N  Doing errands, shopping? N    Fall Risk Assessment Fall Risk  09/11/2015 09/09/2014  Falls in the past year? No No     Depression Screen PHQ 2/9 Scores 09/11/2015 09/09/2014  PHQ - 2 Score 0 0    Cognitive Testing - 6-CIT  Correct? Score   What year is it? yes 0 0 or 4  What month is it? yes 0 0 or 3  Memorize:    Floyde Parkins,  44,  [address]  [place],      What time is it? (within 1 hour) yes 0 0 or 3  Count backwards from 20 yes 0 0, 2, or 4  Name the months of the year yes 0 0, 2, or 4  Repeat name & address above no 0 0, 2, 4, 6, 8, or 10       TOTAL SCORE  2/28   Interpretation:  Normal  Normal (0-7) Abnormal (8-28)       Assessment & Plan:     Annual Wellness Visit  Reviewed patient's Family Medical History Reviewed and updated list of patient's medical  providers Assessment of cognitive impairment was done Assessed patient's functional ability Established a written schedule for health screening services Health Risk Assessent Completed and Reviewed  Exercise Activities and Dietary recommendations Goals    None      Immunization History  Administered Date(s) Administered  . Influenza-Unspecified 01/05/2014  . Pneumococcal Conjugate-13 09/06/2013  . Pneumococcal Polysaccharide-23 02/08/2004  . Td 07/12/2003  . Zoster 09/29/2010    Health Maintenance  Topic Date Due  . Janet Berlin  07/11/2013  . INFLUENZA VACCINE  10/31/2015  . DEXA SCAN  Completed  . ZOSTAVAX  Completed  . PNA vac Low Risk Adult  Completed      Discussed health benefits of physical activity, and encouraged her to engage in regular exercise appropriate for her age and condition.    ------------------------------------------------------------------------------------------------------------ 1. Medicare annual wellness visit, subsequent Stable. As above.  2. Bladder cancer screening UA negative. - POCT urinalysis dipstick Results for orders placed or performed in visit on 09/11/15  POCT urinalysis dipstick  Result Value Ref Range   Color, UA Yellow    Clarity, UA Clear    Glucose, UA Negative    Bilirubin, UA Negative    Ketones, UA Negative    Spec Grav, UA 1.015    Blood, UA Negative    pH, UA 6.0    Protein, UA Negative    Urobilinogen, UA 0.2    Nitrite, UA Negative    Leukocytes, UA Negative Negative     3. Encounter for screening mammogram for breast cancer Ordered today. - MM DIGITAL SCREENING BILATERAL; Future  4. Benign hypertension Advised pt to stop taking Amlodipine qd, only take PRN. Will monitor BP at home. Call if worsening. - CBC with Differential/Platelet - Comprehensive metabolic panel - TSH  5. Cramp of both lower extremities Most likely secondary to Amlodipine. Will check labs. FU pending results. - TSH - Magnesium    Patient seen and examined by Leo Grosser, MD, and note scribed by Allene Dillon, CMA.   I have reviewed the document for accuracy and completeness and I agree with above. - Leo Grosser, MD   Lorie Phenix, MD  [facility]

## 2015-09-12 ENCOUNTER — Telehealth: Payer: Self-pay

## 2015-09-12 LAB — CBC WITH DIFFERENTIAL/PLATELET
BASOS: 1 %
Basophils Absolute: 0 10*3/uL (ref 0.0–0.2)
EOS (ABSOLUTE): 0.1 10*3/uL (ref 0.0–0.4)
Eos: 2 %
HEMATOCRIT: 41.5 % (ref 34.0–46.6)
Hemoglobin: 14.1 g/dL (ref 11.1–15.9)
Immature Grans (Abs): 0 10*3/uL (ref 0.0–0.1)
Immature Granulocytes: 0 %
Lymphocytes Absolute: 1.3 10*3/uL (ref 0.7–3.1)
Lymphs: 23 %
MCH: 31.3 pg (ref 26.6–33.0)
MCHC: 34 g/dL (ref 31.5–35.7)
MCV: 92 fL (ref 79–97)
MONOS ABS: 0.4 10*3/uL (ref 0.1–0.9)
Monocytes: 8 %
NEUTROS ABS: 3.7 10*3/uL (ref 1.4–7.0)
Neutrophils: 66 %
PLATELETS: 257 10*3/uL (ref 150–379)
RBC: 4.51 x10E6/uL (ref 3.77–5.28)
RDW: 13.6 % (ref 12.3–15.4)
WBC: 5.6 10*3/uL (ref 3.4–10.8)

## 2015-09-12 LAB — COMPREHENSIVE METABOLIC PANEL
A/G RATIO: 1.7 (ref 1.2–2.2)
ALT: 10 IU/L (ref 0–32)
AST: 19 IU/L (ref 0–40)
Albumin: 4.3 g/dL (ref 3.5–4.7)
Alkaline Phosphatase: 76 IU/L (ref 39–117)
BILIRUBIN TOTAL: 0.5 mg/dL (ref 0.0–1.2)
BUN/Creatinine Ratio: 17 (ref 12–28)
BUN: 15 mg/dL (ref 8–27)
CHLORIDE: 99 mmol/L (ref 96–106)
CO2: 24 mmol/L (ref 18–29)
Calcium: 9.5 mg/dL (ref 8.7–10.3)
Creatinine, Ser: 0.89 mg/dL (ref 0.57–1.00)
GFR calc non Af Amer: 61 mL/min/{1.73_m2} (ref >59)
GFR, EST AFRICAN AMERICAN: 70 mL/min/{1.73_m2} (ref >59)
GLOBULIN, TOTAL: 2.6 g/dL (ref 1.5–4.5)
Glucose: 108 mg/dL — ABNORMAL HIGH (ref 65–99)
POTASSIUM: 4.2 mmol/L (ref 3.5–5.2)
SODIUM: 140 mmol/L (ref 134–144)
TOTAL PROTEIN: 6.9 g/dL (ref 6.0–8.5)

## 2015-09-12 LAB — TSH: TSH: 1.47 u[IU]/mL (ref 0.450–4.500)

## 2015-09-12 LAB — MAGNESIUM: Magnesium: 1.9 mg/dL (ref 1.6–2.3)

## 2015-09-12 NOTE — Telephone Encounter (Signed)
-----   Message from Lorie PhenixNancy Maloney, MD sent at 09/12/2015  7:24 AM EDT ----- Labs stable. Please notify patient. Thanks.

## 2015-09-12 NOTE — Telephone Encounter (Signed)
Advised pt of lab results. Pt verbally acknowledges understanding. Gao Mitnick Drozdowski, CMA   

## 2015-09-19 ENCOUNTER — Telehealth: Payer: Self-pay | Admitting: Family Medicine

## 2015-09-19 MED ORDER — AMOXICILLIN 500 MG PO CAPS: ORAL_CAPSULE | ORAL | Status: DC

## 2015-09-19 NOTE — Telephone Encounter (Signed)
This has been added to her chart.   Thanks,   -Vernona RiegerLaura

## 2015-09-19 NOTE — Telephone Encounter (Signed)
Pt noticed on her list of medications on her exit report and her My Chart that the antibiotic amoxil that she uses when she has a dental appt is not on the list.  She would like it to be listed.  Her call back is  [phone]  Thanks, Barth Kirksteri

## 2015-10-12 ENCOUNTER — Other Ambulatory Visit: Payer: Self-pay | Admitting: Family Medicine

## 2015-10-12 ENCOUNTER — Ambulatory Visit
Admission: RE | Admit: 2015-10-12 | Discharge: 2015-10-12 | Disposition: A | Discharge status: Home / Self Care | Payer: Medicare Other | Source: Ambulatory Visit | Attending: Family Medicine | Admitting: Family Medicine

## 2015-10-12 DIAGNOSIS — Z1231 Encounter for screening mammogram for malignant neoplasm of breast: Secondary | ICD-10-CM

## 2015-10-24 ENCOUNTER — Other Ambulatory Visit: Payer: Self-pay | Admitting: Family Medicine

## 2015-10-24 DIAGNOSIS — E876 Hypokalemia: Secondary | ICD-10-CM

## 2015-10-24 DIAGNOSIS — I10 Essential (primary) hypertension: Secondary | ICD-10-CM

## 2015-11-07 ENCOUNTER — Ambulatory Visit: Payer: Medicare Other | Admitting: Obstetrics and Gynecology

## 2015-11-14 ENCOUNTER — Ambulatory Visit (INDEPENDENT_AMBULATORY_CARE_PROVIDER_SITE_OTHER): Payer: Medicare Other | Admitting: Obstetrics and Gynecology

## 2015-11-14 ENCOUNTER — Encounter: Payer: Self-pay | Admitting: Obstetrics and Gynecology

## 2015-11-14 VITALS — BP 159/75 | HR 74 | Ht 64.0 in | Wt 109.7 lb

## 2015-11-14 DIAGNOSIS — L900 Lichen sclerosus et atrophicus: Secondary | ICD-10-CM

## 2015-11-14 NOTE — Patient Instructions (Signed)
1. Continue with Temovate ointment once or twice a week topically to the vulva 2. Return in 6 months for follow-up

## 2015-11-14 NOTE — Progress Notes (Signed)
Chief complaint: 1. Lichen sclerosus  Patient presents for a 2 month follow-up. She has done a tapering dose of clobetasol ointment with most recent application twice a week and now currently once a week. Her symptoms primarily in the perianal region. Resolved. She is asymptomatic at this time.  Past medical history, past surgical history, problem list, medications, and allergies are reviewed  OBJECTIVE: BP (!) 159/75   Pulse 74   Ht 5\' 4"  (1.626 m)   Wt 109 lb 11.2 oz (49.8 kg)   BMI 18.83 kg/m  Pelvic: External genitalia-atrophic changes with no epithelial skin breakdown; no leukoplakia; multiple Cherry hemangiomas in the vulva BUS-normal RECTOVAGINAL-No hyperemia; no satellite lesions or epithelial skin breakdown  ASSESSMENT: 1. Lichen sclerosus-asymptomatic with no significant pathologic changes 2. Vulvar atrophy  PLAN: 1. Continue with Temovate ointment once or twice a week 2. Return in 6 months for follow-up  A total of 15 minutes were spent face-to-face with the patient during this encounter and over half of that time dealt with counseling and coordination of care.  Herold HarmsMartin A Edia Pursifull, MD  Note: This dictation was prepared with Dragon dictation along with smaller phrase technology. Any transcriptional errors that result from this process are unintentional.

## 2015-11-16 ENCOUNTER — Emergency Department
Admission: EM | Admit: 2015-11-16 | Discharge: 2015-11-16 | Disposition: A | Discharge status: Home / Self Care | Payer: Medicare Other | Attending: Student in an Organized Health Care Education/Training Program | Admitting: Student in an Organized Health Care Education/Training Program

## 2015-11-16 ENCOUNTER — Emergency Department: Payer: Medicare Other

## 2015-11-16 DIAGNOSIS — S82002A Unspecified fracture of left patella, initial encounter for closed fracture: Secondary | ICD-10-CM

## 2015-11-16 DIAGNOSIS — M25571 Pain in right ankle and joints of right foot: Secondary | ICD-10-CM | POA: Insufficient documentation

## 2015-11-16 DIAGNOSIS — Y929 Unspecified place or not applicable: Secondary | ICD-10-CM | POA: Insufficient documentation

## 2015-11-16 DIAGNOSIS — W010XXA Fall on same level from slipping, tripping and stumbling without subsequent striking against object, initial encounter: Secondary | ICD-10-CM | POA: Insufficient documentation

## 2015-11-16 DIAGNOSIS — Y9301 Activity, walking, marching and hiking: Secondary | ICD-10-CM | POA: Insufficient documentation

## 2015-11-16 DIAGNOSIS — S8992XA Unspecified injury of left lower leg, initial encounter: Secondary | ICD-10-CM | POA: Diagnosis present

## 2015-11-16 DIAGNOSIS — S82042A Displaced comminuted fracture of left patella, initial encounter for closed fracture: Secondary | ICD-10-CM | POA: Diagnosis not present

## 2015-11-16 DIAGNOSIS — M79631 Pain in right forearm: Secondary | ICD-10-CM | POA: Insufficient documentation

## 2015-11-16 DIAGNOSIS — Y999 Unspecified external cause status: Secondary | ICD-10-CM | POA: Diagnosis not present

## 2015-11-16 DIAGNOSIS — W19XXXA Unspecified fall, initial encounter: Secondary | ICD-10-CM

## 2015-11-16 MED ORDER — ONDANSETRON 4 MG PO TBDP
ORAL_TABLET | ORAL | Status: AC
  Filled 2015-11-16: qty 1

## 2015-11-16 MED ORDER — ACETAMINOPHEN 500 MG PO TABS
1000.0000 mg | ORAL_TABLET | Freq: Once | ORAL | Status: AC
  Administered 2015-11-16: 1000 mg via ORAL
  Filled 2015-11-16: qty 2

## 2015-11-16 MED ORDER — OXYCODONE HCL 5 MG PO TABS: 5.0000 mg | ORAL_TABLET | Freq: Four times a day (QID) | ORAL | 0 refills | Status: DC | PRN

## 2015-11-16 MED ORDER — FENTANYL CITRATE (PF) 100 MCG/2ML IJ SOLN
100.0000 ug | INTRAMUSCULAR | Status: DC | PRN
  Administered 2015-11-16: 100 ug via INTRAVENOUS
  Filled 2015-11-16: qty 2

## 2015-11-16 MED ORDER — ASPIRIN EC 325 MG PO TBEC: 325.0000 mg | DELAYED_RELEASE_TABLET | Freq: Every day | ORAL | 0 refills | Status: DC

## 2015-11-16 MED ORDER — LORAZEPAM 0.5 MG PO TABS
0.5000 mg | ORAL_TABLET | Freq: Once | ORAL | Status: AC
  Administered 2015-11-16: 0.5 mg via ORAL
  Filled 2015-11-16: qty 1

## 2015-11-16 MED ORDER — OXYCODONE HCL 5 MG PO TABS
5.0000 mg | ORAL_TABLET | Freq: Once | ORAL | Status: AC
  Administered 2015-11-16: 5 mg via ORAL
  Filled 2015-11-16: qty 1

## 2015-11-16 NOTE — ED Provider Notes (Signed)
[facility] Emergency Department Provider Note    First MD Initiated Contact with Patient 11/16/15 [phone]     (approximate)  I have reviewed the triage vital signs and the nursing notes.   HISTORY  Chief Complaint Knee Pain    HPI Susan Booth is a 82 y.o. female who presents with acute left knee pain following a mechanical fall. Patient was reportedly walking across the street and tripped over the curb landing directly on her left kneecap. Patient felt sudden onset 10/10 dull stabbing pain in the left knee. He did not fall and hit her head. She denies any knee pain. No LOC. No numbness or tingling. She has not been able to ambulate since the fall. She is also complaining of right forearm pain and right ankle pain.   Past Medical History:  Diagnosis Date  . Anxiety   . Benign essential tremor   . Gastroesophageal reflux disease   . Lichen sclerosus et atrophicus of the vulva   . Need for SBE (subacute bacterial endocarditis) prophylaxis   . Osteoporosis, post-menopausal   . Other chest pain   . Paresthesia   . Rhinitis, allergic     Patient Active Problem List   Diagnosis Date Noted  . Lichen sclerosus 07/04/2015  . Rectal itching 07/04/2015  . Heart palpitations 11/25/2014  . Leukoplakia of vulva 09/09/2014  . Hypokalemia 09/09/2014  . Lichen sclerosus et atrophicus of the vulva   . Atypical chest pain 08/26/2014  . Paresthesia 08/26/2014  . Allergic rhinitis 07/22/2014  . Anxiety 07/22/2014  . Benign essential tremor 07/22/2014  . Acid reflux 07/22/2014  . OP (osteoporosis) 07/22/2014  . Bone/cartilage disorder 08/19/2008  . Nonrheumatic tricuspid valve disorder 08/19/2008  . Undiagnosed cardiac murmurs 08/15/2008  . Colon, diverticulosis 08/15/2008  . Benign hypertension 08/15/2008  . Primary pulmonary hypertension (HCC) 08/15/2008  . Heart murmur 08/15/2008  . H/O total hysterectomy 04/02/1967    Past Surgical History:    Procedure Laterality Date  . ABDOMINAL HYSTERECTOMY  1968  . ASD REPAIR, SECUNDUM  03/2007  . CATARACT EXTRACTION Bilateral 06/2012  . CORRECTION HAMMER TOE Bilateral 2003    Prior to Admission medications   Medication Sig Start Date End Date Taking? Authorizing Provider  amoxicillin (AMOXIL) 500 MG capsule Take 500 mg by mouth 3 (three) times daily.    Historical Provider, MD  aspirin 81 MG tablet ASPIRIN EC, 81MG  (Oral Tablet Delayed Release)  1 Every Day for 0 days  Quantity: 0.00;  Refills: 0   Ordered :12-Jan-2010  [name] ;  Started 15-Aug-2008 Active Comments: DX: 401.1 08/15/08   Historical Provider, MD  Calcium Carbonate-Vitamin D 600-400 MG-UNIT per tablet CALTRATE 600+D, 600-400MG -UNIT (Oral Tablet)  1 Every Day for 0 days  Quantity: 0.00;  Refills: 0   Ordered :12-Jan-2010  [name] ;  Started 15-Aug-2008 Active Comments: DX: 733.90 08/15/08   Historical Provider, MD  clobetasol cream (TEMOVATE) 0.05 % Apply 1 application topically 2 (two) times a week. 11/23/14   Prentice Docker Defrancesco, MD  fluticasone (FLONASE) 50 MCG/ACT nasal spray Place into the nose. 09/02/12   Historical Provider, MD  LORazepam (ATIVAN) 0.5 MG tablet Take one tablet twice a day; and then once every six hours as needed. 04/11/15   Lorie Phenix, MD  potassium chloride SA (K-DUR,KLOR-CON) 20 MEQ tablet TAKE ONE TABLET TWICE DAILY 10/27/15   Margaretann Loveless, PA-C  valsartan-hydrochlorothiazide (DIOVAN-HCT) 160-12.5 MG tablet TAKE ONE (1) TABLET BY MOUTH EVERY DAY  08/24/15   Lorie PhenixNancy Maloney, MD    Allergies Review of patient's allergies indicates no known allergies.  Family History  Problem Relation Age of Onset  . COPD Mother   . Early death Mother   . Lung cancer Mother   . Heart disease Father   . Heart attack Father   . Heart disease Sister     heart valve problem  . Breast cancer Maternal Aunt [id number]    Social History Social History  Substance Use Topics  . Smoking status: Never Smoker  .  Smokeless tobacco: Never Used  . Alcohol use No    Review of Systems Patient denies headaches, rhinorrhea, blurry vision, numbness, shortness of breath, chest pain, edema, cough, abdominal pain, nausea, vomiting, diarrhea, dysuria, fevers, rashes or hallucinations unless otherwise stated above in HPI. ____________________________________________   PHYSICAL EXAM:  VITAL SIGNS: Vitals:   11/16/15 0959 11/16/15 1000  BP: (!) 178/84 (!) 165/79  Pulse: 84 86  Resp: 20   Temp: 98.2 F (36.8 C)     Constitutional: Alert and oriented. Well appearing and in no acute distress. Eyes: Conjunctivae are normal. PERRL. EOMI. Head: Atraumatic. Nose: No congestion/rhinnorhea. Mouth/Throat: Mucous membranes are moist.  Oropharynx non-erythematous. Neck: No stridor. Painless ROM. No cervical spine tenderness to palpation Hematological/Lymphatic/Immunilogical: No cervical lymphadenopathy. Cardiovascular: Normal rate, regular rhythm. Grossly normal heart sounds.  Good peripheral circulation. Respiratory: Normal respiratory effort.  No retractions. Lungs CTAB. Gastrointestinal: Soft and nontender. No distention. No abdominal bruits. No CVA tenderness. Genitourinary:  Musculoskeletal: swelling and ecchymosis to left patella with soft effusion.  SILT distally.  2+ DP and PT pulses.  Ecchymosis and swelling to right posterior forearm. SILT distlaly, 2+ radial . Ecchymosis to right lateral malleolus without deformity. Neurologic:  Normal speech and language. No gross focal neurologic deficits are appreciated. No gait instability. Skin:  Skin is warm, dry and intact. No rash noted. Psychiatric: Mood and affect are normal. Speech and behavior are normal.  ____________________________________________   LABS (all labs ordered are listed, but only abnormal results are displayed)  No results found for this or any previous visit (from the past 24  hour(s)). ____________________________________________  ____________________________________  RADIOLOGY XR knee left with acute patella fracture with displacement. XR forearm right, no acute fracture XR ankle right with no acute fracture  ____________________________________________   PROCEDURES  Procedure(s) performed: none    Critical Care performed: no ____________________________________________   INITIAL IMPRESSION / ASSESSMENT AND PLAN / ED COURSE  Pertinent labs & imaging results that were available during my care of the patient were reviewed by me and considered in my medical decision making (see chart for details).  DDX: . Fracture, dislocation, contusion, effusion  Susan Booth is a 82 y.o. who presents to the ED with left knee pain with deformity as well as right forearm and right ankle pain status post mechanical fall. She is otherwise afebrile and hemodynamically stable. Full physical exam shows no evidence of other medical injury. Will provide IV pain management as she does appear to be in acute pain and order x-ray imaging to evaluate for acute traumatic injury.  Clinical Course  Comment By Time  X-ray imaging shows evidence of acute displaced transverse patellar fracture. No evidence of other associated fracture. Patient remains hemodynamically stable. Pain improvement with IV pain medication. Patient currently not complaining of any discomfort after dosing here. Spoke with orthopedics on-call, Dr. Vesta Mixerramsinski, who recommends close follow-up and orthopedic clinic for preoperative planning. Patient placed in a compression bandage  as well as a knee immobilizer. Remainder of her exam shows no evidence of acute fractures. Patient provided with prescription for wheelchair as well as home health PT. Willy EddyPatrick Minnah Llamas, MD 08/17 1221  Called to bedside by patient's sister states that the patient is having panic attack due to cravings to move her left leg. When to  bedside patient with good distal perfusion compartments remain soft. Patient not complaining of any severe pain but very anxious because she can't move the leg and the knee immobilizer. Patient does take Ativan at home. We will provide dose here. Willy EddyPatrick Marte Celani, MD 08/17 1240   ----------------------------------------- 12:57 PM on 11/16/2015 -----------------------------------------  Falls for home health PT provided referral for orthopedic clinic evaluation provided. Patient provided with prescription for oral pain medication as well as encouraged to take daily for dose aspirin to reduce risk of DVT. Discussed signs and symptoms for which the patient should return emergently to the hospital. Based on her presentation I anticipate that she will be appropriate for continued outpatient management.  Have discussed with the patient and available family all diagnostics and treatments performed thus far and all questions were answered to the best of my ability. The patient demonstrates understanding and agreement with plan.   ____________________________________________   FINAL CLINICAL IMPRESSION(S) / ED DIAGNOSES  Final diagnoses:  Patella fracture, left, closed, initial encounter  Fall from standing, initial encounter      NEW MEDICATIONS STARTED DURING THIS VISIT:  New Prescriptions   No medications on file     Note:  This document was prepared using Dragon voice recognition software and may include unintentional dictation errors.    Willy EddyPatrick Zaxton Angerer, MD 11/16/15 1258

## 2015-11-16 NOTE — ED Notes (Signed)
Family unable to attain wheelchair today, per EDP pt clear for discharge

## 2015-11-16 NOTE — ED Provider Notes (Signed)
[facility]-Face [facility] Documentation  Patient name: Susan NeighborsCarolyn P Booth Home Health referral date: 11/16/15  Patient MRN: [id number]  I certify that the following services are medically necessary for this patient based upon my clinical findings: acute left patellar fracture Encounter Date and Reason for Encounter  I certify that I, Willy EddyPatrick Lochlin Eppinger, a qualified non-physician practitioner working with Susan Neighborsarolyn P Novella, had a face-to-face encounter with this patient on the date indicated below due to the medical condition also listed below, which relates to the primary reason the patient requires home health services.  Encounter date: 11/16/15                        Reason: acute knee fracture  Need for Home Health Services I certify that based on my findings:  A. Home health services are medically necessary for this patient, including either intermittent skilled nursing and/or therapy, and  B. This patient is homebound as evidenced by clinical findings below: Unable to ambulate due to fracture, frailty factors, difficulty with ADLs due to injury. My clinical findings support the need for these services because: Unable to ambulate due to fracture, frailty factors, difficulty with ADLs due to injury.  I certify that I, as the certifying physician, composed the above information based on my clinical judgment relating to this patient's medical condition. Certifying physician signature:Elijah Phommachanh Roxan HockeyRobinson Date: 11/16/2015  Please remember to submit the [facility](Home Health Certification form) for billing.    Willy EddyPatrick Ambers Iyengar, MD 11/16/15 1128

## 2015-11-16 NOTE — ED Notes (Signed)
Pt and family educated on application of the knee immobilizer

## 2015-11-16 NOTE — ED Notes (Signed)
Family left to get wheelchair for pt for home, discharge pending for wheelchair

## 2015-11-16 NOTE — ED Notes (Signed)
Compression stocking ordered from Supply chain per Dr. Roxan Hockeyobinson

## 2015-11-16 NOTE — ED Triage Notes (Signed)
Pt reports tripping off curb this morning, pain to left knee. Pt also has hematoma to right elbow and abrasion to right palm. Pt denies hitting head, denies LOC.

## 2015-11-16 NOTE — Care Management Note (Signed)
Case Management Note  Patient Details  Name: Susan NeighborsCarolyn P Booth MRN: [id number] Date of Birth: 10/06/1930  Subjective/Objective:   [facility] for [facility]Called jason and he haas taken the patient information. Made patient and family aware, giving them the office number.                 Action/Plan:   Expected Discharge Date:                  Expected Discharge Plan:     In-House Referral:     Discharge planning Services     Post Acute Care Choice:    Choice offered to:     DME Arranged:    DME Agency:     HH Arranged:    HH Agency:     Status of Service:     If discussed at [employer] Length of Stay Meetings, dates discussed:    Additional Comments:  Berna BueCheryl Waldemar Siegel, RN 11/16/2015, 11:31 AM

## 2015-11-22 ENCOUNTER — Encounter
Admission: RE | Admit: 2015-11-22 | Discharge: 2015-11-22 | Disposition: A | Discharge status: Home / Self Care | Payer: Medicare Other | Source: Ambulatory Visit | Attending: Orthopedic Surgery | Admitting: Orthopedic Surgery

## 2015-11-22 ENCOUNTER — Telehealth: Payer: Self-pay | Admitting: Physician Assistant

## 2015-11-22 ENCOUNTER — Other Ambulatory Visit: Payer: Self-pay | Admitting: Orthopedic Surgery

## 2015-11-22 DIAGNOSIS — Z01818 Encounter for other preprocedural examination: Secondary | ICD-10-CM

## 2015-11-22 DIAGNOSIS — M25569 Pain in unspecified knee: Secondary | ICD-10-CM | POA: Insufficient documentation

## 2015-11-22 HISTORY — DX: Other complications of anesthesia, initial encounter: T88.59XA

## 2015-11-22 HISTORY — DX: Essential (primary) hypertension: I10

## 2015-11-22 HISTORY — DX: Adverse effect of unspecified anesthetic, initial encounter: T41.45XA

## 2015-11-22 HISTORY — DX: Cardiac murmur, unspecified: R01.1

## 2015-11-22 LAB — CBC WITH DIFFERENTIAL/PLATELET
BASOS ABS: 0 10*3/uL (ref 0–0.1)
Basophils Relative: 0 %
EOS PCT: 2 %
Eosinophils Absolute: 0.1 10*3/uL (ref 0–0.7)
HCT: 37.9 % (ref 35.0–47.0)
Hemoglobin: 13.3 g/dL (ref 12.0–16.0)
LYMPHS ABS: 1.2 10*3/uL (ref 1.0–3.6)
LYMPHS PCT: 17 %
MCH: 32 pg (ref 26.0–34.0)
MCHC: 35 g/dL (ref 32.0–36.0)
MCV: 91.5 fL (ref 80.0–100.0)
MONO ABS: 0.4 10*3/uL (ref 0.2–0.9)
MONOS PCT: 6 %
Neutro Abs: 5.3 10*3/uL (ref 1.4–6.5)
Neutrophils Relative %: 75 %
PLATELETS: 242 10*3/uL (ref 150–440)
RBC: 4.14 MIL/uL (ref 3.80–5.20)
RDW: 13.7 % (ref 11.5–14.5)
WBC: 7 10*3/uL (ref 3.6–11.0)

## 2015-11-22 LAB — BASIC METABOLIC PANEL
Anion gap: 6 (ref 5–15)
BUN: 20 mg/dL (ref 6–20)
CHLORIDE: 106 mmol/L (ref 101–111)
CO2: 26 mmol/L (ref 22–32)
Calcium: 9.6 mg/dL (ref 8.9–10.3)
Creatinine, Ser: 0.73 mg/dL (ref 0.44–1.00)
Glucose, Bld: 130 mg/dL — ABNORMAL HIGH (ref 65–99)
POTASSIUM: 3.6 mmol/L (ref 3.5–5.1)
SODIUM: 138 mmol/L (ref 135–145)

## 2015-11-22 LAB — APTT: aPTT: 26 seconds (ref 24–36)

## 2015-11-22 LAB — PROTIME-INR
INR: 1.03
Prothrombin Time: 13.5 seconds (ref 11.4–15.2)

## 2015-11-22 MED ORDER — CHLORHEXIDINE GLUCONATE CLOTH 2 % EX PADS
6.0000 | MEDICATED_PAD | Freq: Once | CUTANEOUS | Status: DC
  Filled 2015-11-22: qty 6

## 2015-11-22 NOTE — Telephone Encounter (Signed)
Pt stated she just left ortho and they planned to do surgery on pt's knee this Friday 11/24/15 and advised pt they need surgical clearance. Pt was seeing Dr. Elease HashimotoMaloney. Pt stated she was advised their office has faxed a form to our office. I advised pt would need an appt. When can I work pt into schedule for tomorrow? You have 2 15 minute slots open in the afternoon @ 3:30 & 4:15. I have put them both on hold incase you wanted to offer pt those appts. Please advise. Thanks TNP

## 2015-11-22 NOTE — Telephone Encounter (Signed)
Referral to cardiology has been placed. 

## 2015-11-22 NOTE — Patient Instructions (Addendum)
  Your procedure is scheduled on: 11/24/15 Fri Report to Same Day Surgery 2nd floor medical mall To find out your arrival time please call [phone] between 1PM - 3PM on  11/23/15 Thurs Remember: Instructions that are not followed completely may result in serious medical risk, up to and including death, or upon the discretion of your surgeon and anesthesiologist your surgery may need to be rescheduled.    _x___ 1. Do not eat food or drink liquids after midnight. No gum chewing or hard candies.     __x__ 2. No Alcohol for 24 hours before or after surgery.   __x__3. No Smoking for 24 prior to surgery.   ____  4. Bring all medications with you on the day of surgery if instructed.    __x__ 5. Notify your doctor if there is any change in your medical condition     (cold, fever, infections).     Do not wear jewelry, make-up, hairpins, clips or nail polish.  Do not wear lotions, powders, or perfumes. You may wear deodorant.  Do not shave 48 hours prior to surgery. Men may shave face and neck.  Do not bring valuables to the hospital.    [facility] is not responsible for any belongings or valuables.               Contacts, dentures or bridgework may not be worn into surgery.  Leave your suitcase in the car. After surgery it may be brought to your room.  For patients admitted to the hospital, discharge time is determined by your treatment team.   Patients discharged the day of surgery will not be allowed to drive home.    Please read over the following fact sheets that you were given:   [facility] Preparing [facility] and or MRSA Information   _x___ Take these medicines the morning of surgery with A SIP OF WATER:    1. LORazepam (ATIVAN) 0.5 MG tablet  2.  3.  4.  5.  6.  ____ Fleet Enema (as directed)   _x___ Use CHG Soap or sage wipes as directed on instruction sheet   ____ Use inhalers on the day of surgery and bring to hospital day of surgery  ____ Stop metformin 2 days  prior to surgery    ____ Take 1/2 of usual insulin dose the night before surgery and none on the morning of           surgery.   _x__ Stop aspirin or coumadin, or plavix stop aspirin yesterday  _x__ Stop Anti-inflammatories such as Advil, Aleve, Ibuprofen, Motrin, Naproxen,          Naprosyn, Goodies powders or aspirin products. Ok to take Tylenol.   ____ Stop supplements until after surgery.    ____ Bring C-Pap to the hospital.

## 2015-11-22 NOTE — Pre-Procedure Instructions (Signed)
Spoke with Dr Randa NgoPiscitello regarding EKG and patient history.  Cardiac clearance requested.  Dr Samuel GermanyKrasinski's office called and faxed with the request.  Patient does not have a cardiologist.

## 2015-11-22 NOTE — Telephone Encounter (Signed)
Yes just book her tomorrow and put both on hold to let her choose which one. Keep both held so I can catch up if needed since surgical clearance may take longer if she needs an EKG. Thanks

## 2015-11-22 NOTE — Telephone Encounter (Signed)
Spoke with pt and schedule for tomorrow @ 3:30 and left the 4:15 slot as unavailable. Thanks TNP

## 2015-11-22 NOTE — Telephone Encounter (Signed)
Sherry with [facility] called b/c they are needing a cardiac referral for pt b/c of the EKG that the hospital did today. Cordelia PenSherry advised that pt is requesting to see to Dr. Mariah MillingGollan. Cordelia PenSherry stated that they still want to do surgery on Friday and are going to fax over the EKG and notes they have for pt. Cordelia PenSherry requested if pt needs to come in for an OV for the referral to be completed to have pt come in today. I advised that tomorrow afternoon was the earliest we could see pt and that the pt was worked in for that appt. Thanks TNP

## 2015-11-23 ENCOUNTER — Telehealth: Payer: Self-pay | Admitting: Physician Assistant

## 2015-11-23 ENCOUNTER — Ambulatory Visit: Payer: Medicare Other | Admitting: Physician Assistant

## 2015-11-23 NOTE — Telephone Encounter (Signed)
Spoke with husband. Patient has appt with Dr. Lady GaryFath at 245 today for cardiac clearance. If ok with Dr. Martha ClanKrasinski patient most likely does not need to be seen here. [facility] and awaiting response to make sure that is ok. If ok, may cancel her appointment.

## 2015-11-23 NOTE — Telephone Encounter (Signed)
Pt has appointment with you today for surgery clearance.You have referred pt to cardiology for this and pt's appointment is today with Dr Lady GaryFath.Pt wants to know if she still needs to come in for office visit.

## 2015-11-23 NOTE — Telephone Encounter (Signed)
Spoke with Cordelia PenSherry at [facility]. They only need a cardiac clearance. Advised patient's son that she did not need to come in a medical clearance since she was seeing Dr. Lady GaryFath at 2:45pm today.

## 2015-11-24 ENCOUNTER — Inpatient Hospital Stay: Payer: Medicare Other | Admitting: Anesthesiology

## 2015-11-24 ENCOUNTER — Inpatient Hospital Stay
Admission: RE | Admit: 2015-11-24 | Discharge: 2015-11-25 | DRG: 517 | Disposition: A | Discharge status: Home / Self Care | Payer: Medicare Other | Source: Ambulatory Visit | Attending: Orthopedic Surgery | Admitting: Orthopedic Surgery

## 2015-11-24 ENCOUNTER — Inpatient Hospital Stay: Payer: Medicare Other

## 2015-11-24 ENCOUNTER — Encounter
Admission: RE | Disposition: A | Discharge status: Home / Self Care | Payer: Self-pay | Source: Ambulatory Visit | Attending: Orthopedic Surgery

## 2015-11-24 ENCOUNTER — Encounter: Payer: Self-pay | Admitting: *Deleted

## 2015-11-24 DIAGNOSIS — Z801 Family history of malignant neoplasm of trachea, bronchus and lung: Secondary | ICD-10-CM | POA: Diagnosis not present

## 2015-11-24 DIAGNOSIS — Z419 Encounter for procedure for purposes other than remedying health state, unspecified: Secondary | ICD-10-CM

## 2015-11-24 DIAGNOSIS — S82032A Displaced transverse fracture of left patella, initial encounter for closed fracture: Principal | ICD-10-CM | POA: Diagnosis present

## 2015-11-24 DIAGNOSIS — Z8249 Family history of ischemic heart disease and other diseases of the circulatory system: Secondary | ICD-10-CM

## 2015-11-24 DIAGNOSIS — K219 Gastro-esophageal reflux disease without esophagitis: Secondary | ICD-10-CM | POA: Diagnosis present

## 2015-11-24 DIAGNOSIS — W101XXA Fall (on)(from) sidewalk curb, initial encounter: Secondary | ICD-10-CM | POA: Diagnosis present

## 2015-11-24 DIAGNOSIS — I10 Essential (primary) hypertension: Secondary | ICD-10-CM | POA: Diagnosis present

## 2015-11-24 DIAGNOSIS — Z825 Family history of asthma and other chronic lower respiratory diseases: Secondary | ICD-10-CM | POA: Diagnosis not present

## 2015-11-24 DIAGNOSIS — Z803 Family history of malignant neoplasm of breast: Secondary | ICD-10-CM | POA: Diagnosis not present

## 2015-11-24 DIAGNOSIS — Z7982 Long term (current) use of aspirin: Secondary | ICD-10-CM | POA: Diagnosis not present

## 2015-11-24 DIAGNOSIS — M810 Age-related osteoporosis without current pathological fracture: Secondary | ICD-10-CM | POA: Diagnosis present

## 2015-11-24 DIAGNOSIS — S82009A Unspecified fracture of unspecified patella, initial encounter for closed fracture: Secondary | ICD-10-CM | POA: Diagnosis present

## 2015-11-24 DIAGNOSIS — G250 Essential tremor: Secondary | ICD-10-CM | POA: Diagnosis present

## 2015-11-24 DIAGNOSIS — Z79899 Other long term (current) drug therapy: Secondary | ICD-10-CM

## 2015-11-24 HISTORY — PX: ORIF PATELLA: SHX5033

## 2015-11-24 SURGERY — OPEN REDUCTION INTERNAL FIXATION (ORIF) PATELLA
Anesthesia: Spinal | Site: Knee | Laterality: Left | Wound class: Clean

## 2015-11-24 MED ORDER — BUPIVACAINE HCL (PF) 0.25 % IJ SOLN
INTRAMUSCULAR | Status: AC
  Filled 2015-11-24: qty 30

## 2015-11-24 MED ORDER — LACTATED RINGERS IV SOLN
INTRAVENOUS | Status: DC
  Administered 2015-11-24: 13:00:00 via INTRAVENOUS

## 2015-11-24 MED ORDER — LIDOCAINE HCL (PF) 1 % IJ SOLN
INTRAMUSCULAR | Status: DC | PRN
  Administered 2015-11-24: 1 mL via INTRADERMAL

## 2015-11-24 MED ORDER — ACETAMINOPHEN 500 MG PO TABS
1000.0000 mg | ORAL_TABLET | Freq: Four times a day (QID) | ORAL | Status: DC
  Administered 2015-11-24 – 2015-11-25 (×3): 1000 mg via ORAL
  Filled 2015-11-24 (×3): qty 2

## 2015-11-24 MED ORDER — BUPIVACAINE HCL (PF) 0.5 % IJ SOLN
INTRAMUSCULAR | Status: AC
  Filled 2015-11-24: qty 30

## 2015-11-24 MED ORDER — FLUTICASONE PROPIONATE 50 MCG/ACT NA SUSP
1.0000 | Freq: Every day | NASAL | Status: DC
  Filled 2015-11-24: qty 16

## 2015-11-24 MED ORDER — MAGNESIUM HYDROXIDE 400 MG/5ML PO SUSP: 30.0000 mL | Freq: Every day | ORAL | Status: DC | PRN

## 2015-11-24 MED ORDER — PROPOFOL 500 MG/50ML IV EMUL
INTRAVENOUS | Status: DC | PRN
  Administered 2015-11-24: 50 ug/kg/min via INTRAVENOUS

## 2015-11-24 MED ORDER — ONDANSETRON HCL 4 MG/2ML IJ SOLN
4.0000 mg | Freq: Four times a day (QID) | INTRAMUSCULAR | Status: DC | PRN
  Administered 2015-11-24: 4 mg via INTRAVENOUS
  Filled 2015-11-24: qty 2

## 2015-11-24 MED ORDER — HYDROCHLOROTHIAZIDE 12.5 MG PO CAPS
12.5000 mg | ORAL_CAPSULE | Freq: Every day | ORAL | Status: DC
  Administered 2015-11-24 – 2015-11-25 (×2): 12.5 mg via ORAL
  Filled 2015-11-24 (×2): qty 1

## 2015-11-24 MED ORDER — METHOCARBAMOL 1000 MG/10ML IJ SOLN
500.0000 mg | Freq: Four times a day (QID) | INTRAVENOUS | Status: DC | PRN
  Filled 2015-11-24: qty 5

## 2015-11-24 MED ORDER — METOCLOPRAMIDE HCL 5 MG/ML IJ SOLN
5.0000 mg | Freq: Three times a day (TID) | INTRAMUSCULAR | Status: DC | PRN
  Administered 2015-11-24: 10 mg via INTRAVENOUS
  Filled 2015-11-24: qty 2

## 2015-11-24 MED ORDER — CEFAZOLIN SODIUM-DEXTROSE 2-4 GM/100ML-% IV SOLN
INTRAVENOUS | Status: AC
  Filled 2015-11-24: qty 100

## 2015-11-24 MED ORDER — LORAZEPAM 0.5 MG PO TABS
0.5000 mg | ORAL_TABLET | Freq: Two times a day (BID) | ORAL | Status: DC
  Administered 2015-11-24 – 2015-11-25 (×2): 0.5 mg via ORAL
  Filled 2015-11-24 (×2): qty 1

## 2015-11-24 MED ORDER — ASPIRIN EC 81 MG PO TBEC
81.0000 mg | DELAYED_RELEASE_TABLET | Freq: Once | ORAL | Status: AC
  Administered 2015-11-24: 81 mg via ORAL
  Filled 2015-11-24: qty 1

## 2015-11-24 MED ORDER — BUPIVACAINE HCL (PF) 0.5 % IJ SOLN
INTRAMUSCULAR | Status: DC | PRN
  Administered 2015-11-24: 30 mL

## 2015-11-24 MED ORDER — SODIUM CHLORIDE 0.9 % IV SOLN
INTRAVENOUS | Status: DC
  Administered 2015-11-24: 1000 mL via INTRAVENOUS

## 2015-11-24 MED ORDER — BUPIVACAINE HCL (PF) 0.5 % IJ SOLN
INTRAMUSCULAR | Status: DC | PRN
  Administered 2015-11-24: 2.5 mL

## 2015-11-24 MED ORDER — METHOCARBAMOL 500 MG PO TABS
500.0000 mg | ORAL_TABLET | Freq: Four times a day (QID) | ORAL | Status: DC | PRN
  Administered 2015-11-24: 500 mg via ORAL
  Filled 2015-11-24: qty 1

## 2015-11-24 MED ORDER — ENOXAPARIN SODIUM 30 MG/0.3ML ~~LOC~~ SOLN
30.0000 mg | Freq: Two times a day (BID) | SUBCUTANEOUS | Status: DC
  Administered 2015-11-25: 30 mg via SUBCUTANEOUS
  Filled 2015-11-24: qty 0.3

## 2015-11-24 MED ORDER — CALCIUM CARBONATE-VITAMIN D 500-200 MG-UNIT PO TABS
1.0000 | ORAL_TABLET | Freq: Every day | ORAL | Status: DC
  Administered 2015-11-25: 1 via ORAL
  Filled 2015-11-24: qty 1

## 2015-11-24 MED ORDER — DIPHENHYDRAMINE HCL 12.5 MG/5ML PO ELIX: 12.5000 mg | ORAL_SOLUTION | ORAL | Status: DC | PRN

## 2015-11-24 MED ORDER — FENTANYL CITRATE (PF) 100 MCG/2ML IJ SOLN
INTRAMUSCULAR | Status: DC | PRN
  Administered 2015-11-24: 12.5 ug via INTRAVENOUS
  Administered 2015-11-24 (×2): 25 ug via INTRAVENOUS
  Administered 2015-11-24: 12.5 ug via INTRAVENOUS
  Administered 2015-11-24: 25 ug via INTRAVENOUS

## 2015-11-24 MED ORDER — ALUM & MAG HYDROXIDE-SIMETH 200-200-20 MG/5ML PO SUSP: 30.0000 mL | ORAL | Status: DC | PRN

## 2015-11-24 MED ORDER — NEOMYCIN-POLYMYXIN B GU 40-200000 IR SOLN
Status: DC | PRN
  Administered 2015-11-24: 2 mL

## 2015-11-24 MED ORDER — CEFAZOLIN IN D5W 1 GM/50ML IV SOLN
1.0000 g | Freq: Four times a day (QID) | INTRAVENOUS | Status: AC
Start: 2015-11-24 — End: 2015-11-25
  Administered 2015-11-24 – 2015-11-25 (×2): 1 g via INTRAVENOUS
  Filled 2015-11-24 (×3): qty 50

## 2015-11-24 MED ORDER — ONDANSETRON HCL 4 MG PO TABS: 4.0000 mg | ORAL_TABLET | Freq: Four times a day (QID) | ORAL | Status: DC | PRN

## 2015-11-24 MED ORDER — CEFAZOLIN SODIUM-DEXTROSE 2-4 GM/100ML-% IV SOLN
2.0000 g | INTRAVENOUS | Status: AC
  Administered 2015-11-24: 2 g via INTRAVENOUS

## 2015-11-24 MED ORDER — BISACODYL 5 MG PO TBEC: 5.0000 mg | DELAYED_RELEASE_TABLET | Freq: Every day | ORAL | Status: DC | PRN

## 2015-11-24 MED ORDER — DOCUSATE SODIUM 100 MG PO CAPS
100.0000 mg | ORAL_CAPSULE | Freq: Two times a day (BID) | ORAL | Status: DC
  Administered 2015-11-24 – 2015-11-25 (×2): 100 mg via ORAL
  Filled 2015-11-24 (×2): qty 1

## 2015-11-24 MED ORDER — ACETAMINOPHEN 325 MG PO TABS: 650.0000 mg | ORAL_TABLET | Freq: Four times a day (QID) | ORAL | Status: DC | PRN

## 2015-11-24 MED ORDER — FENTANYL CITRATE (PF) 100 MCG/2ML IJ SOLN
25.0000 ug | INTRAMUSCULAR | Status: DC | PRN
Start: 2015-11-24 — End: 2015-11-24

## 2015-11-24 MED ORDER — FAMOTIDINE 20 MG PO TABS
20.0000 mg | ORAL_TABLET | Freq: Once | ORAL | Status: AC
  Administered 2015-11-24: 20 mg via ORAL

## 2015-11-24 MED ORDER — ACETAMINOPHEN 650 MG RE SUPP: 650.0000 mg | Freq: Four times a day (QID) | RECTAL | Status: DC | PRN

## 2015-11-24 MED ORDER — POTASSIUM CHLORIDE CRYS ER 20 MEQ PO TBCR
20.0000 meq | EXTENDED_RELEASE_TABLET | Freq: Two times a day (BID) | ORAL | Status: DC
  Administered 2015-11-24 – 2015-11-25 (×2): 20 meq via ORAL
  Filled 2015-11-24 (×2): qty 1

## 2015-11-24 MED ORDER — IRBESARTAN 150 MG PO TABS
150.0000 mg | ORAL_TABLET | Freq: Every day | ORAL | Status: DC
  Administered 2015-11-24 – 2015-11-25 (×2): 150 mg via ORAL
  Filled 2015-11-24 (×2): qty 1

## 2015-11-24 MED ORDER — MORPHINE SULFATE (PF) 2 MG/ML IV SOLN
2.0000 mg | INTRAVENOUS | Status: DC | PRN
  Administered 2015-11-24 (×2): 2 mg via INTRAVENOUS
  Filled 2015-11-24 (×2): qty 1

## 2015-11-24 MED ORDER — VALSARTAN-HYDROCHLOROTHIAZIDE 160-12.5 MG PO TABS: 1.0000 | ORAL_TABLET | Freq: Every day | ORAL | Status: DC

## 2015-11-24 MED ORDER — OXYCODONE HCL 5 MG PO TABS
5.0000 mg | ORAL_TABLET | ORAL | Status: DC | PRN
  Administered 2015-11-25 (×2): 5 mg via ORAL
  Filled 2015-11-24 (×2): qty 1

## 2015-11-24 MED ORDER — NEOMYCIN-POLYMYXIN B GU 40-200000 IR SOLN
Status: AC
  Filled 2015-11-24: qty 2

## 2015-11-24 MED ORDER — CLOBETASOL PROPIONATE 0.05 % EX CREA
1.0000 "application " | TOPICAL_CREAM | CUTANEOUS | Status: DC
  Filled 2015-11-24: qty 15

## 2015-11-24 MED ORDER — METOCLOPRAMIDE HCL 10 MG PO TABS: 5.0000 mg | ORAL_TABLET | Freq: Three times a day (TID) | ORAL | Status: DC | PRN

## 2015-11-24 MED ORDER — FAMOTIDINE 20 MG PO TABS
ORAL_TABLET | ORAL | Status: AC
  Filled 2015-11-24: qty 1

## 2015-11-24 SURGICAL SUPPLY — 52 items
BANDAGE ELASTIC 6 LF NS (GAUZE/BANDAGES/DRESSINGS) ×3 IMPLANT
BLADE SURG SZ10 CARB STEEL (BLADE) ×3 IMPLANT
BNDG CMPR MED 5X6 ELC HKLP NS (GAUZE/BANDAGES/DRESSINGS) ×1
BNDG COHESIVE 4X5 TAN STRL (GAUZE/BANDAGES/DRESSINGS) ×3 IMPLANT
BNDG ESMARK 6X12 TAN STRL LF (GAUZE/BANDAGES/DRESSINGS) ×3 IMPLANT
BRACE KNEE POST OP SHORT (BRACE) ×3 IMPLANT
CABLE PIN IMPLANT 35 (Cable) ×1 IMPLANT
CABLE PIN IMPLANT 35MM (Cable) ×1 IMPLANT
CANISTER SUCT 1200ML W/VALVE (MISCELLANEOUS) ×3 IMPLANT
COOLER POLAR GLACIER W/PUMP (MISCELLANEOUS) ×3 IMPLANT
CUFF TOURN 24 STER (MISCELLANEOUS) ×2 IMPLANT
CUFF TOURN 30 STER DUAL PORT (MISCELLANEOUS) IMPLANT
DRAPE C-ARM XRAY 36X54 (DRAPES) ×3 IMPLANT
DRAPE C-ARMOR (DRAPES) ×3 IMPLANT
DRAPE INCISE IOBAN 66X45 STRL (DRAPES) ×3 IMPLANT
DRAPE U-SHAPE 47X51 STRL (DRAPES) ×3 IMPLANT
DRILL GUIDE DBL 2.5/3.5MM (INSTRUMENTS) ×2 IMPLANT
DURAPREP 26ML APPLICATOR (WOUND CARE) ×9 IMPLANT
ELECT REM PT RETURN 9FT ADLT (ELECTROSURGICAL) ×3
ELECTRODE REM PT RTRN 9FT ADLT (ELECTROSURGICAL) ×1 IMPLANT
GAUZE PETRO XEROFOAM 1X8 (MISCELLANEOUS) ×3 IMPLANT
GAUZE SPONGE 4X4 12PLY STRL (GAUZE/BANDAGES/DRESSINGS) ×3 IMPLANT
GLOVE BIOGEL PI IND STRL 9 (GLOVE) ×1 IMPLANT
GLOVE BIOGEL PI INDICATOR 9 (GLOVE) ×2
GLOVE SURG 9.0 ORTHO LTXF (GLOVE) ×9 IMPLANT
GOWN STRL REUS TWL 2XL XL LVL4 (GOWN DISPOSABLE) ×3 IMPLANT
GOWN STRL REUS W/ TWL LRG LVL3 (GOWN DISPOSABLE) ×1 IMPLANT
GOWN STRL REUS W/TWL LRG LVL3 (GOWN DISPOSABLE) ×3
IMMOB KNEE 14 THIGH 24 706614 (SOFTGOODS) IMPLANT
IV CATH ANGIO 14GX1.88 NO SAFE (IV SOLUTION) IMPLANT
KIT RM TURNOVER STRD PROC AR (KITS) ×3 IMPLANT
NDL SAFETY 18GX1.5 (NEEDLE) ×3 IMPLANT
NS IRRIG 500ML POUR BTL (IV SOLUTION) ×3 IMPLANT
PACK EXTREMITY ARMC (MISCELLANEOUS) ×3 IMPLANT
PAD ABD DERMACEA PRESS 5X9 (GAUZE/BANDAGES/DRESSINGS) ×6 IMPLANT
PAD PREP 24X41 OB/GYN DISP (PERSONAL CARE ITEMS) ×1 IMPLANT
PAD WRAPON POLAR KNEE (MISCELLANEOUS) ×1 IMPLANT
PADDING CAST 4IN STRL (MISCELLANEOUS) ×4
PADDING CAST BLEND 4X4 STRL (MISCELLANEOUS) ×2 IMPLANT
RETRIEVER SUT HEWSON (MISCELLANEOUS) IMPLANT
SPONGE LAP 18X18 5 PK (GAUZE/BANDAGES/DRESSINGS) ×3 IMPLANT
STAPLER SKIN PROX 35W (STAPLE) ×3 IMPLANT
STOCKINETTE BIAS CUT 6 980064 (GAUZE/BANDAGES/DRESSINGS) ×3 IMPLANT
STOCKINETTE IMPERVIOUS 9X36 MD (GAUZE/BANDAGES/DRESSINGS) ×3 IMPLANT
SUT FIBERWIRE #5 38 BLUE (WIRE) IMPLANT
SUT STEEL 7 (SUTURE) IMPLANT
SUT TICRON 2-0 30IN 311381 (SUTURE) ×3 IMPLANT
SUT VIC AB 0 CT1 36 (SUTURE) ×3 IMPLANT
SUT VIC AB 2-0 CT1 (SUTURE) ×3 IMPLANT
SUT VIC AB 2-0 CT1 36 (SUTURE) ×3 IMPLANT
SYRINGE 10CC LL (SYRINGE) ×3 IMPLANT
WRAPON POLAR PAD KNEE (MISCELLANEOUS) ×3

## 2015-11-24 NOTE — Anesthesia Procedure Notes (Addendum)
Spinal  Patient location during procedure: OR Start time: 11/24/2015 1:30 PM End time: 11/24/2015 1:31 PM Staffing Anesthesiologist: Katy Fitch K Performed: anesthesiologist  Preanesthetic Checklist Completed: patient identified, site marked, surgical consent, pre-op evaluation, timeout performed, IV checked, risks and benefits discussed and monitors and equipment checked Spinal Block Patient position: sitting Prep: ChloraPrep Patient monitoring: heart rate, continuous pulse ox, blood pressure and cardiac monitor Approach: midline Location: L4-5 Injection technique: single-shot Needle Needle type: Whitacre and Introducer  Needle gauge: 22 G Needle length: 9 cm Assessment Sensory level: T10 Additional Notes Negative paresthesia. Negative blood return. Positive free-flowing CSF. Expiration date of kit checked and confirmed. Patient tolerated procedure well, without complications.

## 2015-11-24 NOTE — Anesthesia Preprocedure Evaluation (Signed)
Anesthesia Evaluation  Patient identified by MRN, date of birth, ID band Patient awake    Reviewed: Allergy & Precautions, H&P , NPO status , Patient's Chart, lab work & pertinent test results  History of Anesthesia Complications (+) history of anesthetic complications  Airway Mallampati: III  TM Distance: <3 FB Neck ROM: limited    Dental  (+) Poor Dentition, Chipped   Pulmonary neg pulmonary ROS,    Pulmonary exam normal breath sounds clear to auscultation       Cardiovascular Exercise Tolerance: Good hypertension, (-) angina(-) Past MI and (-) DOE Normal cardiovascular exam+ Valvular Problems/Murmurs  Rhythm:regular Rate:Normal     Neuro/Psych PSYCHIATRIC DISORDERS Anxiety negative neurological ROS     GI/Hepatic Neg liver ROS, GERD  Controlled,  Endo/Other  negative endocrine ROS  Renal/GU negative Renal ROS  negative genitourinary   Musculoskeletal   Abdominal   Peds  Hematology negative hematology ROS (+)   Anesthesia Other Findings Past Medical History: No date: Anxiety No date: Benign essential tremor No date: Complication of anesthesia     Comment: nausea No date: Gastroesophageal reflux disease No date: Hypertension No date: Lichen sclerosus et atrophicus of the vulva No date: Murmur No date: Need for SBE (subacute bacterial endocarditis)* No date: Osteoporosis, post-menopausal No date: Other chest pain No date: Paresthesia No date: Rhinitis, allergic  Past Surgical History: 1968: ABDOMINAL HYSTERECTOMY 03/2007: ASD REPAIR, SECUNDUM 06/2012: CATARACT EXTRACTION Bilateral 2003: CORRECTION HAMMER TOE Bilateral No date: hole in heart repaired     Comment: continue to have some leakage     Reproductive/Obstetrics negative OB ROS                             Anesthesia Physical Anesthesia Plan  ASA: III  Anesthesia Plan: Spinal   Post-op Pain Management:     Induction:   Airway Management Planned:   Additional Equipment:   Intra-op Plan:   Post-operative Plan:   Informed Consent: I have reviewed the patients History and Physical, chart, labs and discussed the procedure including the risks, benefits and alternatives for the proposed anesthesia with the patient or authorized representative who has indicated his/her understanding and acceptance.   Dental Advisory Given  Plan Discussed with: Anesthesiologist, CRNA and Surgeon  Anesthesia Plan Comments:         Anesthesia Quick Evaluation

## 2015-11-24 NOTE — Anesthesia Postprocedure Evaluation (Signed)
Anesthesia Post Note  Patient: Susan NeighborsCarolyn P Booth  Procedure(s) Performed: Procedure(s) (LRB): OPEN REDUCTION INTERNAL (ORIF) FIXATION PATELLA (Left)  Patient location during evaluation: PACU Anesthesia Type: Spinal Level of consciousness: oriented and awake and alert Pain management: pain level controlled Vital Signs Assessment: post-procedure vital signs reviewed and stable Respiratory status: respiratory function stable, nonlabored ventilation and spontaneous breathing Cardiovascular status: blood pressure returned to baseline and stable Postop Assessment: no headache and no backache Anesthetic complications: no    Last Vitals:  Vitals:   11/24/15 1639 11/24/15 1708  BP: (!) 141/67 (!) 163/88  Pulse: 65 86  Resp: (!) 25 20  Temp: 36.3 C 36.7 C    Last Pain:  Vitals:   11/24/15 1708  TempSrc: Oral  PainSc:                  Trevor Duty

## 2015-11-24 NOTE — Progress Notes (Signed)
[facility]  Patient Status: patient refused services, stated [facility], did not state which agency.      Dimple CaseyJason E Hinton 11/24/2015, 11:36 AM

## 2015-11-24 NOTE — Op Note (Signed)
11/24/2015  4:01 PM  PATIENT:  Susan Booth    PRE-OPERATIVE DIAGNOSIS:  E70.350K Displaced transverse fracture of left patella, init  POST-OPERATIVE DIAGNOSIS:  Same  PROCEDURE:  OPEN REDUCTION INTERNAL (ORIF) FIXATION LEFT PATELLA  SURGEON:  Thornton Park, MD  ANESTHESIA:   General  PREOPERATIVE INDICATIONS:  CYLAH FANNIN is a  82 y.o. female with a diagnosis of S82.032A Displaced transverse fracture of left patella after a fall on 11/16/15.   Given the displaced nature of this fracture I recommended that the patient undergo surgical fixation for this fracture.  I discussed the risks and benefits of surgery. The risks include but are not limited to infection, bleeding requiring blood transfusion, nerve or blood vessel injury, joint stiffness or loss of motion, persistent pain, weakness or instability, malunion, nonunion and hardware failure and the need for further surgery. Medical risks include but are not limited to DVT and pulmonary embolism, myocardial infarction, stroke, pneumonia, respiratory failure and death. Patient understood these risks and wished to proceed.   OPERATIVE IMPLANTS: Comptroller Pin System  OPERATIVE FINDINGS: Displaced transverse patella fracture   OPERATIVE PROCEDURE: Patient was met in the preoperative area with her family at the bedside. I reviewed the surgical plan for surgical fixation of her patella fracture. I reviewed the risks and benefits of surgery. She understood the risks included but are not limited to infection, bleeding, nerve or blood vessel injury, knee stiffness, persistent pain, malunion, nonunion, hardware failure and the need for further surgery. Medical risks include but are not limited to DVT and pulmonary embolism, myocardial infarction, stroke, pneumonia, respiratory failure and death. Patient understood these risks and wished to proceed.  Patient was brought to the operating room. She was placed supine on the  operative table. She underwent spinal anesthesia. Patient had a tourniquet applied to the left upper thigh. She was prepped and draped in a sterile fashion. A timeout was performed to verify the patient's name, date of birth, medical record number, correct site of surgery correct procedure to be performed. The timeout was also used to verify the patient received antibiotics and that all appropriate instruments, implants and radiographic studies were available in the room. Once all in attendance were in agreement case began.  A vertical incision was made centered over the patella. Full-thickness skin flaps were developed. The fracture was then identified. The retinaculum was not torn. The fracture was irrigated. I was gently curetted to remove any interposed soft tissue. A manual reduction of the fracture was performed and a large fracture clamp was then used to hold the fracture reduction. The fracture reduction was  confirmed on AP and lateral C-arm imaging.   Once the fracture was well reduced, two Zimmer cable ready threaded pins were advanced from the inferior pole of the patella into the superior patella fragment. The position of the threaded pins were confirmed on AP and lateral C-arm images. The threaded pins were determined to be well positioned. A 2.5 mm drill bit was then advanced across the superior patella fragment in a transverse direction superficial to the threaded pins. The wire from the lateral threaded pin was then sent through the patella via the hole created by the 2.5 mm drill bit. The wire from the medial threaded pin was then crossed over the anterior portion of the patella, and an X configuration. The wires were then loaded into the wire tensioner. The wires were then tensioned. The fracture compression was monitored on C-arm images. Once adequate decompression  was achieved the wires were crimped. The tension through the tensioner device was then released. The tensioner was removed from  the wires and the wires were cut with a wire cutter.  Final C-arm images were taken. The surgical wound was copiously irrigated.  The retinaculum was repaired with 0 Vicryl. The wire construct was covered with a soft tissue envelope closed with 0 Vicryl. Subcutaneous tissue was closed with an interrupted 2-0 Vicryl and the skin was approximated with staples. All sharp and instrument counts were correct at the conclusion the case. I was scrubbed and present the entire case. I spoke with the patient's family in the postop consultation room at the conclusion the case with them know the patient was stable in the recovery room in the case had been performed without complication.

## 2015-11-24 NOTE — Transfer of Care (Signed)
Immediate Anesthesia Transfer of Care Note  Patient: Susan NeighborsCarolyn P Booth  Procedure(s) Performed: Procedure(s) with comments: OPEN REDUCTION INTERNAL (ORIF) FIXATION PATELLA (Left) - (PER Dr Rico AlaKransinski Spinal would be ideal   Patient Location: PACU  Anesthesia Type:Spinal  Level of Consciousness: awake, alert  and oriented  Airway & Oxygen Therapy: Patient Spontanous Breathing  Post-op Assessment: Report given to RN and Post -op Vital signs reviewed and stable  Post vital signs: Reviewed and stable  Last Vitals:  Vitals:   11/24/15 1201 11/24/15 1539  BP: (!) 141/61 127/69  Pulse: 79 63  Resp: 18 19  Temp: 36.6 C 36.6 C    Last Pain:  Vitals:   11/24/15 1201  TempSrc: Tympanic  PainSc: 4          Complications: No apparent anesthesia complications

## 2015-11-24 NOTE — H&P (Signed)
PREOPERATIVE H&P  Chief Complaint: R60.454US82.032A Displaced transverse fracture of left patella, init  HPI: Susan Booth is a 82 y.o. female who presents for preoperative history and physical with a diagnosis of S82.032A Displaced transverse fracture of left patella, init. He shouldn't sustained a recent patellar fracture status post fall off a curb. She was very of [facility]. I recommended open reduction internal fixation for her left displaced patella fracture.  Past Medical History:  Diagnosis Date  . Anxiety   . Benign essential tremor   . Complication of anesthesia    nausea  . Gastroesophageal reflux disease   . Hypertension   . Lichen sclerosus et atrophicus of the vulva   . Murmur   . Need for SBE (subacute bacterial endocarditis) prophylaxis   . Osteoporosis, post-menopausal   . Other chest pain   . Paresthesia   . Rhinitis, allergic    Past Surgical History:  Procedure Laterality Date  . ABDOMINAL HYSTERECTOMY  1968  . ASD REPAIR, SECUNDUM  03/2007  . CATARACT EXTRACTION Bilateral 06/2012  . CORRECTION HAMMER TOE Bilateral 2003  . hole in heart repaired     continue to have some leakage   Social History   Social History  . Marital status: Married    Spouse name: [name]  . Number of children: 2  . Years of education: College   Occupational History  . Retired    Social History Main Topics  . Smoking status: Never Smoker  . Smokeless tobacco: Never Used  . Alcohol use No  . Drug use: No  . Sexual activity: No   Other Topics Concern  . None   Social History Narrative  . None   Family History  Problem Relation Age of Onset  . COPD Mother   . Early death Mother   . Lung cancer Mother   . Heart disease Father   . Heart attack Father   . Heart disease Sister     heart valve problem  . Breast cancer Maternal Aunt 70   No Known Allergies Prior to Admission medications   Medication Sig Start Date End Date Taking?  Authorizing Provider  Calcium Carbonate-Vitamin D 600-400 MG-UNIT per tablet CALTRATE 600+D, 600-400MG -UNIT (Oral Tablet)  1 Every Day for 0 days  Quantity: 0.00;  Refills: 0   Ordered :12-Jan-2010  Merleen NicelyHaith, Tammy ;  Started 15-Aug-2008 Active Comments: DX: 733.90 08/15/08  Yes Historical Provider, MD  clobetasol cream (TEMOVATE) 0.05 % Apply 1 application topically 2 (two) times a week. 11/23/14  Yes Prentice DockerMartin A Defrancesco, MD  fluticasone (FLONASE) 50 MCG/ACT nasal spray Place 1 spray into the nose daily as needed.  09/02/12  Yes Historical Provider, MD  LORazepam (ATIVAN) 0.5 MG tablet Take one tablet twice a day; and then once every six hours as needed. 04/11/15  Yes Lorie PhenixNancy Maloney, MD  potassium chloride SA (K-DUR,KLOR-CON) 20 MEQ tablet TAKE ONE TABLET TWICE DAILY 10/27/15  Yes Alessandra BevelsJennifer M Burnette, PA-C  valsartan-hydrochlorothiazide (DIOVAN-HCT) 160-12.5 MG tablet TAKE ONE (1) TABLET BY MOUTH EVERY DAY 08/24/15  Yes Lorie PhenixNancy Maloney, MD  amoxicillin (AMOXIL) 500 MG capsule Take 500 mg by mouth 3 (three) times daily as needed. For dental work    EcologistHistorical Provider, MD  aspirin 81 MG tablet ASPIRIN EC, 81MG  (Oral Tablet Delayed Release)  1 Every Day for 0 days  Quantity: 0.00;  Refills: 0   Ordered :12-Jan-2010  Merleen NicelyHaith, Tammy ;  Started 15-Aug-2008 Active Comments: DX: 401.1 08/15/08  Historical Provider, MD     Positive ROS: All other systems have been reviewed and were otherwise negative with the exception of those mentioned in the HPI and as above.  Physical Exam: General: Alert, no acute distress Cardiovascular: Regular rate and rhythm, no murmurs rubs or gallops.  No pedal edema Respiratory: Clear to auscultation bilaterally, no wheezes rales or rhonchi. No cyanosis, no use of accessory musculature GI: No organomegaly, abdomen is soft and non-tender nondistended with positive bowel sounds. Skin: Skin intact, no lesions within the operative field. Neurologic: Sensation intact distally Psychiatric:  Patient is competent for consent with normal mood and affect Lymphatic: No axillary or cervical lymphadenopathy  MUSCULOSKELETAL: Left knee: Patient's skin is intact. There is no erythema or ecchymosis or significant swelling. She is a palpable defect of the patella. She has intact sensation light touch in palpable pedal pulses distally. She has intact motor function distally as well.  Assessment: S82.032A Displaced transverse fracture of left patella, init  Plan: Plan for Procedure(s): OPEN REDUCTION INTERNAL (ORIF) FIXATION LEFT PATELLA  I discussed the details of the operation with the patient and her family today. We discussed the postoperative course as well. She will be admitted postoperatively for pain control.   I have also discussed the risks and benefits of surgery. The risks include but are not limited to infection, bleeding requiring blood transfusion, nerve or blood vessel injury, joint stiffness or loss of motion, persistent pain, weakness or instability, malunion, nonunion and hardware failure and the need for further surgery. Medical risks include but are not limited to DVT and pulmonary embolism, myocardial infarction, stroke, pneumonia, respiratory failure and death. Patient understood these risks and wished to proceed. I have answered all questions by the patient and family.   Juanell Fairly, MD   11/24/2015 1:18 PM

## 2015-11-24 NOTE — Progress Notes (Signed)
ANTICOAGULATION CONSULT NOTE - Initial Consult  Pharmacy Consult for Enoxaparin  Indication: VTE prophylaxis  No Known Allergies  Patient Measurements:   Vital Signs: Temp: 97.4 F (36.3 C) (08/25 1639) Temp Source: Tympanic (08/25 1201) BP: 141/67 (08/25 1639) Pulse Rate: 65 (08/25 1639)  Labs:  Recent Labs  11/22/15 1315  HGB 13.3  HCT 37.9  PLT 242  APTT 26  LABPROT 13.5  INR 1.03  CREATININE 0.73    Estimated Creatinine Clearance: 42.7 mL/min (by C-G formula based on SCr of 0.8 mg/dL).   Medical History: Past Medical History:  Diagnosis Date  . Anxiety   . Benign essential tremor   . Complication of anesthesia    nausea  . Gastroesophageal reflux disease   . Hypertension   . Lichen sclerosus et atrophicus of the vulva   . Murmur   . Need for SBE (subacute bacterial endocarditis) prophylaxis   . Osteoporosis, post-menopausal   . Other chest pain   . Paresthesia   . Rhinitis, allergic     Assessment: 82 yo female s/p  Open reduction internal fixation after fracture of left patella.    Plan:  CBC and BMP scheduled with 0500 labs. Enoxaparin can start Day +1 following surgery. Will plan to start enoxaparin at 0800 on 8/25. Will wait for updated Morning labs to confirm Scr and platelets. Currently patient qualifies for Enoxaparin 40mg  daily.   Cher NakaiSheema Fernando Stoiber, PharmD Clinical Pharmacist 11/24/2015 5:00 PM

## 2015-11-25 LAB — CBC
HEMATOCRIT: 32.6 % — AB (ref 35.0–47.0)
HEMOGLOBIN: 11.7 g/dL — AB (ref 12.0–16.0)
MCH: 32.8 pg (ref 26.0–34.0)
MCHC: 35.8 g/dL (ref 32.0–36.0)
MCV: 91.7 fL (ref 80.0–100.0)
Platelets: 214 10*3/uL (ref 150–440)
RBC: 3.56 MIL/uL — ABNORMAL LOW (ref 3.80–5.20)
RDW: 13.2 % (ref 11.5–14.5)
WBC: 8 10*3/uL (ref 3.6–11.0)

## 2015-11-25 LAB — BASIC METABOLIC PANEL
ANION GAP: 5 (ref 5–15)
BUN: 18 mg/dL (ref 6–20)
CALCIUM: 8.6 mg/dL — AB (ref 8.9–10.3)
CO2: 29 mmol/L (ref 22–32)
Chloride: 100 mmol/L — ABNORMAL LOW (ref 101–111)
Creatinine, Ser: 0.74 mg/dL (ref 0.44–1.00)
GFR calc Af Amer: >60 mL/min (ref >60)
GLUCOSE: 120 mg/dL — AB (ref 65–99)
POTASSIUM: 3.2 mmol/L — AB (ref 3.5–5.1)
SODIUM: 134 mmol/L — AB (ref 135–145)

## 2015-11-25 MED ORDER — OXYCODONE HCL 5 MG PO TABS: 5.0000 mg | ORAL_TABLET | ORAL | 0 refills | Status: DC | PRN

## 2015-11-25 MED ORDER — POTASSIUM CHLORIDE CRYS ER 20 MEQ PO TBCR: 40.0000 meq | EXTENDED_RELEASE_TABLET | Freq: Two times a day (BID) | ORAL | Status: DC

## 2015-11-25 MED ORDER — ASPIRIN EC 325 MG PO TBEC: 325.0000 mg | DELAYED_RELEASE_TABLET | Freq: Every day | ORAL | 0 refills | Status: DC

## 2015-11-25 MED ORDER — ENOXAPARIN SODIUM 40 MG/0.4ML ~~LOC~~ SOLN: 40.0000 mg | SUBCUTANEOUS | Status: DC

## 2015-11-25 MED ORDER — POTASSIUM CHLORIDE CRYS ER 20 MEQ PO TBCR
40.0000 meq | EXTENDED_RELEASE_TABLET | Freq: Once | ORAL | Status: AC
  Administered 2015-11-25: 40 meq via ORAL
  Filled 2015-11-25: qty 2

## 2015-11-25 NOTE — Progress Notes (Signed)
Subjective:  POD #1 s/p ORIF left patella fracture.  Patient reports pain as mild.  Patient has been up with physical therapy and did well according to her physical therapist. She is no significant pain in the left knee and has no other complaints. Her husband is at the bedside. Patient states that she is ready to go home.  Objective:   VITALS:   Vitals:   11/24/15 1837 11/24/15 1933 11/25/15 0511 11/25/15 0755  BP: (!) 145/56 127/60 (!) 127/52 134/64  Pulse: 73 67 80 82  Resp: 18 18 16 18   Temp: 98 F (36.7 C) 98.3 F (36.8 C) 97.9 F (36.6 C) 98.6 F (37 C)  TempSrc: Oral Oral Oral Oral  SpO2: 97% 96% 97% 97%  Weight:      Height:        PHYSICAL EXAM:  Left knee: Patient's dressing is clean dry and intact. She is a Polar Care on the left knee. Her hinged knee brace is on the left knee and locked in extension. Patient can flex and extend her toes, dorsiflex and plantarflex her ankle and has intact sensation to light touch in palpable pedal pulses and left lower extremity. Her leg compartments are soft and compressible.   LABS  Results for orders placed or performed during the hospital encounter of 11/24/15 (from the past 24 hour(s))  Basic metabolic panel     Status: Abnormal   Collection Time: 11/25/15  5:11 AM  Result Value Ref Range   Sodium 134 (L) 135 - 145 mmol/L   Potassium 3.2 (L) 3.5 - 5.1 mmol/L   Chloride 100 (L) 101 - 111 mmol/L   CO2 29 22 - 32 mmol/L   Glucose, Bld 120 (H) 65 - 99 mg/dL   BUN 18 6 - 20 mg/dL   Creatinine, Ser 1.610.74 0.44 - 1.00 mg/dL   Calcium 8.6 (L) 8.9 - 10.3 mg/dL   GFR calc non Af Amer >60 >60 mL/min   GFR calc Af Amer >60 >60 mL/min   Anion gap 5 5 - 15  CBC     Status: Abnormal   Collection Time: 11/25/15  5:11 AM  Result Value Ref Range   WBC 8.0 3.6 - 11.0 K/uL   RBC 3.56 (L) 3.80 - 5.20 MIL/uL   Hemoglobin 11.7 (L) 12.0 - 16.0 g/dL   HCT 09.632.6 (L) 04.535.0 - 40.947.0 %   MCV 91.7 80.0 - 100.0 fL   MCH 32.8 26.0 - 34.0 pg   MCHC  35.8 32.0 - 36.0 g/dL   RDW 81.113.2 91.411.5 - 78.214.5 %   Platelets 214 150 - 440 K/uL    Dg Knee 1-2 Views Left  Result Date: 11/24/2015 CLINICAL DATA:  ORIF left patella fracture EXAM: DG C-ARM 61-120 MIN; LEFT KNEE - 1-2 VIEW COMPARISON:  11/16/2015 left knee radiographs FINDINGS: Fluoroscopy time 32 seconds. There are 5 nondiagnostic spot fluoroscopic intraoperative radiographs of the left knee demonstrating transfixation of the left patella fracture with 2 screws and cerclage wire. IMPRESSION: Intraoperative fluoroscopic guidance for ORIF left patella fracture. Electronically Signed   By: Delbert PhenixJason A Poff M.D.   On: 11/24/2015 15:17   Dg Knee Left Port  Result Date: 11/24/2015 CLINICAL DATA:  82 year old female status post surgery for patella fracture. Initial encounter. EXAM: PORTABLE LEFT KNEE - 1-2 VIEW COMPARISON:  Intraoperative images 1610 hours today. FINDINGS: Portable AP and cross-table lateral views demonstrate ORIF of the transverse comminuted patella fracture with near anatomic alignment. Overlying skin staples. Small volume of  suprapatellar fluid Ing gas as well as subcutaneous gas suspected. Overlying anterior skin staples. Underlying normal bone mineralization. Other knee joint spaces and alignment are preserved. IMPRESSION: Status post ORIF of left patella fracture with no adverse features identified. Electronically Signed   By: Odessa Fleming M.D.   On: 11/24/2015 16:35   Dg C-arm 61-120 Min  Result Date: 11/24/2015 CLINICAL DATA:  ORIF left patella fracture EXAM: DG C-ARM 61-120 MIN; LEFT KNEE - 1-2 VIEW COMPARISON:  11/16/2015 left knee radiographs FINDINGS: Fluoroscopy time 32 seconds. There are 5 nondiagnostic spot fluoroscopic intraoperative radiographs of the left knee demonstrating transfixation of the left patella fracture with 2 screws and cerclage wire. IMPRESSION: Intraoperative fluoroscopic guidance for ORIF left patella fracture. Electronically Signed   By: Delbert Phenix M.D.   On:  11/24/2015 15:17    Assessment/Plan: 1 Day Post-Op   Active Problems:   Patella fracture  Patient is doing very well postop. She has completed 24 hours of postop antibiotics. She is doing well with physical therapy and is ready for discharge home. She will follow up with me in the office in approximately 10-14 days for wound check, staple removal and x-ray. She will keep her hinged knee brace on at all times. She may remain partial weightbearing on the left lower extremity with her walker. She was encouraged to continue elevation of the left lower extremity at home. She will continue use the Polar Care for comfort. She would prefer to use enteric-coated aspirin 325 mg by mouth twice a day for DVT prophylaxis instead of Lovenox.    Juanell Fairly , MD 11/25/2015, 2:57 PM

## 2015-11-25 NOTE — Progress Notes (Signed)
Patient spouse stated he preferred ASA vs lovenox for DVT prophylaxis, relayed to MD.

## 2015-11-25 NOTE — Progress Notes (Signed)
Reviewed discharge summary with verbal understanding. RXs given upon discharge. Belongings given upon discharge. Escorted OOF via wc into private vehicle by ortho staff.

## 2015-11-25 NOTE — Care Management Note (Signed)
Case Management Note  Patient Details  Name: Susan Booth MRN: [id number] Date of Birth: 10/11/1930  Subjective/Objective:    Discussed discharge planning with Mr and Mrs Janee Mornhompson. They chose [facility] from choice of providers. Mrs Janee Mornhompson has a RW at home. Refused Lovenox prescription and requested ASA from MD.                 Action/Plan:   Expected Discharge Date:                  Expected Discharge Plan:     In-House Referral:     Discharge planning Services     Post Acute Care Choice:    Choice offered to:     DME Arranged:    DME Agency:     HH Arranged:    HH Agency:     Status of Service:     If discussed at [employer] Meetings, dates discussed:    Additional Comments:  Eula Mazzola A, RN 11/25/2015, 3:00 PM

## 2015-11-25 NOTE — Evaluation (Signed)
Physical Therapy Evaluation Patient Details Name: Susan NeighborsCarolyn P Bob MRN: [id number] DOB: 11/21/1930 Today's Date: 11/25/2015   History of Present Illness  Pt fell of curb ~1 week ago, suffering patellar fracture and needing ORIF.  [place]   Clinical Impression  Pt shows good motivation and effort t/o the PT exam and was able to do some prolonged ambulation with out overt safety issues.  She does not have excessive pain and is not limited by it. Pt was able to negotiate up/down steps with single rail and felt confidence with the effort.  Pt able to show good effort with ~10 minutes of exercises apart from the exam and overall did well.  Pt able to safely go home with assist from family.     Follow Up Recommendations Home health PT (per orthopedic recs)    Equipment Recommendations       Recommendations for Other Services       Precautions / Restrictions Precautions Precautions: Knee Required Braces or Orthoses: Knee Immobilizer - Left Knee Immobilizer - Left: On when out of bed or walking;On at all times Restrictions Weight Bearing Restrictions: Yes LLE Weight Bearing: Partial weight bearing (per verbal order from orthopedic)      Mobility  Bed Mobility Overal bed mobility: Modified Independent             General bed mobility comments: Pt needed minimal cuing to get LEs to EOB, no direct assist  Transfers Overall transfer level: Modified independent Equipment used: Rolling walker (2 wheeled)             General transfer comment: Pt able to rise to standing w/o direct assist, no balance issues in standing with walker  Ambulation/Gait Ambulation/Gait assistance: Supervision Ambulation Distance (Feet): 75 Feet Assistive device: Rolling walker (2 wheeled)       General Gait Details: Pt able to maintain PWBing using walker well and does not have excessive pain or hesitancy.  Pt is safe and shows good relative confidence.  Stairs Stairs: Yes Stairs  assistance: Min guard Stair Management: One rail Left;Sideways Number of Stairs: 4 General stair comments: Pt able to negotiate up/down steps w/o safety issues and reports feeling confidence with the technique  Wheelchair Mobility    Modified Rankin (Stroke Patients Only)       Balance                                             Pertinent Vitals/Pain Pain Assessment: 0-10 Pain Score: 4     Home Living Family/patient expects to be discharged to:: Private residence Living Arrangements: Spouse/significant other Available Help at Discharge: Family (son will be in town to assist) Type of Home: House Home Access: Stairs to enter Entrance Stairs-Rails: Left Entrance Stairs-Number of Steps: 2 Home Layout: One level Home Equipment: Environmental consultantWalker - 2 wheels;Cane - single point      Prior Function Level of Independence: Independent               Hand Dominance        Extremity/Trunk Assessment   Upper Extremity Assessment: Overall WFL for tasks assessed           Lower Extremity Assessment: Overall WFL for tasks assessed;LLE deficits/detail   LLE Deficits / Details: Pt displays grossly 3/5 L LE strength, unable to do SLRs does show strength with over hip acts, ankle  grossly 4/5     Communication   Communication: No difficulties  Cognition Arousal/Alertness: Awake/alert Behavior During Therapy: WFL for tasks assessed/performed Overall Cognitive Status: Within Functional Limits for tasks assessed                      General Comments      Exercises General Exercises - Lower Extremity Ankle Circles/Pumps: Strengthening;10 reps Quad Sets: 10 reps;Strengthening Gluteal Sets: Strengthening;10 reps Hip ABduction/ADduction: Strengthening;10 reps Straight Leg Raises: PROM;5 reps      Assessment/Plan    PT Assessment Patient needs continued PT services  PT Diagnosis Difficulty walking;Generalized weakness   PT Problem List Decreased  strength;Decreased activity tolerance;Decreased balance;Decreased knowledge of use of DME;Decreased safety awareness;Decreased range of motion  PT Treatment Interventions DME instruction;Gait training;Stair training;Functional mobility training;Therapeutic activities;Therapeutic exercise;Balance training;Neuromuscular re-education;Patient/family education   PT Goals (Current goals can be found in the Care Plan section) Acute Rehab PT Goals Patient Stated Goal: Go home PT Goal Formulation: With patient Time For Goal Achievement: 12/02/15 Potential to Achieve Goals: Good    Frequency 7X/week   Barriers to discharge        Co-evaluation               End of Session Equipment Utilized During Treatment: Gait belt Activity Tolerance: Patient tolerated treatment well Patient left: with chair alarm set;with call bell/phone within reach;with family/visitor present Nurse Communication: Mobility status         Time: 4098-1191 PT Time Calculation (min) (ACUTE ONLY): 24 min   Charges:   PT Evaluation $PT Eval Low Complexity: 1 Procedure PT Treatments $Therapeutic Exercise: 8-22 mins   PT G Codes:        Malachi Pro, DPT 11/25/2015, 12:29 PM

## 2015-11-25 NOTE — Progress Notes (Signed)
ANTICOAGULATION CONSULT NOTE - Initial Consult  Pharmacy Consult for Enoxaparin  Indication: VTE prophylaxis  No Known Allergies  Patient Measurements: Height: 5\' 4"  (162.6 cm) Weight: 110 lb 0.2 oz (49.9 kg) IBW/kg (Calculated) : 54.7 Vital Signs: Temp: 97.9 F (36.6 C) (08/26 0511) Temp Source: Oral (08/26 0511) BP: 127/52 (08/26 0511) Pulse Rate: 80 (08/26 0511)  Labs:  Recent Labs  11/22/15 1315 11/25/15 0511  HGB 13.3 11.7*  HCT 37.9 32.6*  PLT 242 214  APTT 26  --   LABPROT 13.5  --   INR 1.03  --   CREATININE 0.73 0.74    Estimated Creatinine Clearance: 42.7 mL/min (by C-G formula based on SCr of 0.8 mg/dL).   Medical History: Past Medical History:  Diagnosis Date  . Anxiety   . Benign essential tremor   . Complication of anesthesia    nausea  . Gastroesophageal reflux disease   . Hypertension   . Lichen sclerosus et atrophicus of the vulva   . Murmur   . Need for SBE (subacute bacterial endocarditis) prophylaxis   . Osteoporosis, post-menopausal   . Other chest pain   . Paresthesia   . Rhinitis, allergic     Assessment: 82 yo female s/p  Open reduction internal fixation after fracture of left patella.    Plan:  CBC and BMP scheduled with 0500 labs. Enoxaparin can start Day +1 following surgery. Will plan to start enoxaparin at 0800 on 8/25. Will wait for updated Morning labs to confirm Scr and platelets. Currently patient qualifies for Enoxaparin 40mg  daily.   8/26 AM. Patient already started on enoxaparin 30 mg bid by orthopedics. Pharmacy will continue to follow.  Cher NakaiSheema Hallaji, PharmD Clinical Pharmacist 11/25/2015 6:48 AM

## 2015-11-25 NOTE — Discharge Summary (Signed)
Physician Discharge Summary  Patient ID: Susan Booth MRN: [id number] DOB/AGE: 1930/10/24 82 y.o.  Admit date: 11/24/2015 Discharge date: 11/25/2015  Admission Diagnoses:  S82.032A Displaced transverse fracture of left patella, init  Discharge Diagnoses:  S82.032A Displaced transverse fracture of left patella, init Active Problems: S/p ORIF of left Patella fracture   Past Medical History:  Diagnosis Date  . Anxiety   . Benign essential tremor   . Complication of anesthesia    nausea  . Gastroesophageal reflux disease   . Hypertension   . Lichen sclerosus et atrophicus of the vulva   . Murmur   . Need for SBE (subacute bacterial endocarditis) prophylaxis   . Osteoporosis, post-menopausal   . Other chest pain   . Paresthesia   . Rhinitis, allergic     Surgeries: Procedure(s): OPEN REDUCTION INTERNAL (ORIF) FIXATION PATELLA on 11/24/2015   Consultants (if any):   Discharged Condition: Improved  Hospital Course: Susan Booth is an 82 y.o. female who was admitted 11/24/2015 with a diagnosis of  S82.032A Displaced transverse fracture of left patella, init and went to the operating room on 11/24/2015 and underwent an uncomplicated open reduction internal fixation of left transverse patella fracture.    She was given perioperative antibiotics:  Anti-infectives    Start     Dose/Rate Route Frequency Ordered Stop   11/24/15 1930  ceFAZolin (ANCEF) IVPB 1 g/50 mL premix     1 g 100 mL/hr over 30 Minutes Intravenous Every 6 hours 11/24/15 1655 11/25/15 0149   11/24/15 1145  ceFAZolin (ANCEF) 2-4 GM/100ML-% IVPB    Comments:  Susan Booth: cabinet override      11/24/15 1145 11/24/15 2359   11/24/15 0113  ceFAZolin (ANCEF) IVPB 2g/100 mL premix     2 g 200 mL/hr over 30 Minutes Intravenous On call to O.R. 11/24/15 0113 11/24/15 1335    . Patient was admitted the orthopedic floor postoperatively for neurovascular monitoring, postoperative IV antibiotics and pain  control.  On postoperative day #1 patient and her Foley catheter removed. She made excellent progress with physical therapy. Her pain was well controlled. She had no complaints and was ready for discharge home.  She was given sequential compression devices, early ambulation, and Lovenox for DVT prophylaxis.  She benefited maximally from the hospital stay and there were no complications.    Recent vital signs:  Vitals:   11/25/15 1500 11/25/15 1502  BP:  (!) 132/47  Pulse:  75  Resp: 18 18  Temp:  97.5 F (36.4 C)    Recent laboratory studies:  Lab Results  Component Value Date   HGB 11.7 (L) 11/25/2015   HGB 13.3 11/22/2015   HGB 14.3 09/06/2013   Lab Results  Component Value Date   WBC 8.0 11/25/2015   PLT 214 11/25/2015   Lab Results  Component Value Date   INR 1.03 11/22/2015   Lab Results  Component Value Date   NA 134 (L) 11/25/2015   K 3.2 (L) 11/25/2015   CL 100 (L) 11/25/2015   CO2 29 11/25/2015   BUN 18 11/25/2015   CREATININE 0.74 11/25/2015   GLUCOSE 120 (H) 11/25/2015    Discharge Medications:     Medication List    STOP taking these medications   aspirin 81 MG tablet Replaced by:  aspirin EC 325 MG tablet     TAKE these medications   amoxicillin 500 MG capsule Commonly known as:  AMOXIL Take 500 mg by mouth 3 (three) times  daily as needed. For dental work   aspirin EC 325 MG tablet Take 1 tablet (325 mg total) by mouth daily. Replaces:  aspirin 81 MG tablet   Calcium Carbonate-Vitamin D 600-400 MG-UNIT tablet CALTRATE 600+D, 600-400MG -UNIT (Oral Tablet)  1 Every Day for 0 days  Quantity: 0.00;  Refills: 0   Ordered :12-Jan-2010  Susan Booth ;  Started 15-Aug-2008 Active Comments: DX: 733.90   clobetasol cream 0.05 % Commonly known as:  TEMOVATE Apply 1 application topically 2 (two) times a week.   fluticasone 50 MCG/ACT nasal spray Commonly known as:  FLONASE Place 1 spray into the nose daily as needed.   LORazepam 0.5 MG  tablet Commonly known as:  ATIVAN Take one tablet twice a day; and then once every six hours as needed.   oxyCODONE 5 MG immediate release tablet Commonly known as:  Oxy IR/ROXICODONE Take 1-2 tablets (5-10 mg total) by mouth every 4 (four) hours as needed for breakthrough pain.   potassium chloride SA 20 MEQ tablet Commonly known as:  K-DUR,KLOR-CON TAKE ONE TABLET TWICE DAILY   valsartan-hydrochlorothiazide 160-12.5 MG tablet Commonly known as:  DIOVAN-HCT TAKE ONE (1) TABLET BY MOUTH EVERY DAY       Diagnostic Studies: Dg Forearm Right  Result Date: 11/16/2015 CLINICAL DATA:  Acute onset pain EXAM: RIGHT FOREARM - 2 VIEW COMPARISON:  None. FINDINGS: Frontal and lateral views were obtained. There is no demonstrable fracture or dislocation. No joint effusion. The joint spaces appear normal. No abnormal periosteal reaction. Bones appear somewhat osteoporotic. IMPRESSION: Bones appear somewhat osteoporotic. No fracture or dislocation. No apparent arthropathy. Electronically Signed   By: Bretta Bang III M.D.   On: 11/16/2015 10:52   Dg Knee 1-2 Views Left  Result Date: 11/24/2015 CLINICAL DATA:  ORIF left patella fracture EXAM: DG C-ARM 61-120 MIN; LEFT KNEE - 1-2 VIEW COMPARISON:  11/16/2015 left knee radiographs FINDINGS: Fluoroscopy time 32 seconds. There are 5 nondiagnostic spot fluoroscopic intraoperative radiographs of the left knee demonstrating transfixation of the left patella fracture with 2 screws and cerclage wire. IMPRESSION: Intraoperative fluoroscopic guidance for ORIF left patella fracture. Electronically Signed   By: Delbert Phenix M.D.   On: 11/24/2015 15:17   Dg Ankle 2 Views Right  Result Date: 11/16/2015 CLINICAL DATA:  Pain EXAM: RIGHT ANKLE - 2 VIEW COMPARISON:  None. FINDINGS: Frontal and lateral views were obtained. There is no demonstrable fracture. There is a small joint effusion. The ankle mortise appears intact. There is no appreciable joint space  narrowing. There is a small inferior calcaneal spur. There are foci of arterial vascular calcification. IMPRESSION: No demonstrable fracture. Small joint effusion. Small inferior calcaneal spur. Scattered foci of atherosclerotic calcification. Ankle mortise appears intact. Electronically Signed   By: Bretta Bang III M.D.   On: 11/16/2015 10:51   Dg Knee Complete 4 Views Left  Result Date: 11/16/2015 CLINICAL DATA:  Acute pain, status post fall this morning on sidewall EXAM: LEFT KNEE - COMPLETE 4+ VIEW COMPARISON:  None. FINDINGS: Four views of the left knee submitted. There is mild angulated mild displaced minimal comminuted fracture of the left patella. Large joint effusion. Minimal narrowing of medial joint compartment. IMPRESSION: Minimal angulated mild displaced minimal comminuted fracture of the left patella. Large joint effusion. Mild narrowing of medial joint compartment. Electronically Signed   By: Natasha Mead M.D.   On: 11/16/2015 10:50   Dg Knee Left Port  Result Date: 11/24/2015 CLINICAL DATA:  82 year old female status post surgery for  patella fracture. Initial encounter. EXAM: PORTABLE LEFT KNEE - 1-2 VIEW COMPARISON:  Intraoperative images 1610 hours today. FINDINGS: Portable AP and cross-table lateral views demonstrate ORIF of the transverse comminuted patella fracture with near anatomic alignment. Overlying skin staples. Small volume of suprapatellar fluid Ing gas as well as subcutaneous gas suspected. Overlying anterior skin staples. Underlying normal bone mineralization. Other knee joint spaces and alignment are preserved. IMPRESSION: Status post ORIF of left patella fracture with no adverse features identified. Electronically Signed   By: Odessa FlemingH  Hall M.D.   On: 11/24/2015 16:35   Dg C-arm 61-120 Min  Result Date: 11/24/2015 CLINICAL DATA:  ORIF left patella fracture EXAM: DG C-ARM 61-120 MIN; LEFT KNEE - 1-2 VIEW COMPARISON:  11/16/2015 left knee radiographs FINDINGS: Fluoroscopy  time 32 seconds. There are 5 nondiagnostic spot fluoroscopic intraoperative radiographs of the left knee demonstrating transfixation of the left patella fracture with 2 screws and cerclage wire. IMPRESSION: Intraoperative fluoroscopic guidance for ORIF left patella fracture. Electronically Signed   By: Delbert PhenixJason A Poff M.D.   On: 11/24/2015 15:17    Disposition: 01-Home or Self Care  Discharge Instructions    Call MD / Call 911    Complete by:  As directed   If you experience chest pain or shortness of breath, CALL 911 and be transported to the hospital emergency room.  If you develope a fever above 101 F, pus (white drainage) or increased drainage or redness at the wound, or calf pain, call your surgeon's office.   Constipation Prevention    Complete by:  As directed   Drink plenty of fluids.  Prune juice may be helpful.  You may use a stool softener, such as Colace (over the counter) 100 mg twice a day.  Use MiraLax (over the counter) for constipation as needed.   Diet general    Complete by:  As directed   Discharge instructions    Complete by:  As directed   She will follow up with me in the office in approximately 10-14 days for wound check, staple removal and x-ray. She will keep her hinged knee brace on at all times. She may remain partial weightbearing on the left lower extremity with her walker. She was encouraged to continue elevation of the left lower extremity at home. She will continue use the Polar Care for comfort. She will take enteric-coated aspirin 325 mg by mouth twice a day for DVT prophylaxis.   Driving restrictions    Complete by:  As directed   No driving for 6 weeks   Increase activity slowly as tolerated    Complete by:  As directed   Lifting restrictions    Complete by:  As directed   No lifting for 12-16 weeks         Signed: Juanell FairlyKRASINSKI, Momo Braun ,MD 11/25/2015, 3:05 PM

## 2015-11-25 NOTE — Progress Notes (Signed)
OT Cancellation Note  Patient Details Name: Susan Booth MRN: [id number] DOB: 12/22/1930   Cancelled Treatment:    Reason Eval/Treat Not Completed: Patient declined (Pt educated on scope of OT, politely declined OT evaluation/treatment, going home today and has 24/7 help at home). Spouse also in room and supportive of pt's decision.  Eliezer BottomJamie L Stiller, OTR/L 11/25/2015, 3:11 PM

## 2015-11-27 ENCOUNTER — Encounter: Payer: Self-pay | Admitting: Orthopedic Surgery

## 2016-02-06 ENCOUNTER — Other Ambulatory Visit: Payer: Self-pay | Admitting: Obstetrics and Gynecology

## 2016-02-21 ENCOUNTER — Other Ambulatory Visit: Payer: Self-pay | Admitting: Physician Assistant

## 2016-02-21 DIAGNOSIS — I10 Essential (primary) hypertension: Secondary | ICD-10-CM

## 2016-04-29 ENCOUNTER — Encounter: Payer: Self-pay | Admitting: Physician Assistant

## 2016-04-29 ENCOUNTER — Ambulatory Visit (INDEPENDENT_AMBULATORY_CARE_PROVIDER_SITE_OTHER): Payer: Medicare Other | Admitting: Physician Assistant

## 2016-04-29 ENCOUNTER — Other Ambulatory Visit: Payer: Self-pay | Admitting: Physician Assistant

## 2016-04-29 VITALS — BP 158/80 | HR 88 | Temp 98.0°F | Resp 16 | Wt 110.0 lb

## 2016-04-29 DIAGNOSIS — I499 Cardiac arrhythmia, unspecified: Secondary | ICD-10-CM

## 2016-04-29 DIAGNOSIS — R5383 Other fatigue: Secondary | ICD-10-CM

## 2016-04-29 DIAGNOSIS — I10 Essential (primary) hypertension: Secondary | ICD-10-CM | POA: Diagnosis not present

## 2016-04-29 DIAGNOSIS — E876 Hypokalemia: Secondary | ICD-10-CM | POA: Diagnosis not present

## 2016-04-29 MED ORDER — VALSARTAN-HYDROCHLOROTHIAZIDE 320-12.5 MG PO TABS: 1.0000 | ORAL_TABLET | Freq: Every day | ORAL | 2 refills | Status: DC

## 2016-04-29 MED ORDER — POTASSIUM CHLORIDE CRYS ER 20 MEQ PO TBCR: 20.0000 meq | EXTENDED_RELEASE_TABLET | Freq: Two times a day (BID) | ORAL | 2 refills | Status: DC

## 2016-04-29 NOTE — Progress Notes (Signed)
Patient: Susan Booth Female    DOB: [birth year]   83 y.o.   MRN: [id number] Visit Date: 04/29/2016  Today's Provider: Trey Sailors, PA-C   Chief Complaint  Patient presents with  . Fatigue  . Hypertension   Subjective:    Hypertension  This is a chronic problem. The problem is uncontrolled (Pt reports her blood pressure is running high at home.  150's/80-90's). Associated symptoms include anxiety, malaise/fatigue and shortness of breath. Pertinent negatives include no blurred vision, chest pain, headaches, palpitations or peripheral edema. There are no compliance problems.    Fatigue: Patient complains of fatigue. Symptoms began several weeks ago. Sentinal symptom the patient feels fatigue began with: none. Symptoms of her fatigue have been anxiousness and general malaise. Patient describes the following psychologic symptoms: Anxiety.  Patient denies fever, unusual rashes, cold intolerance, constipation and change in hair texture. and GI blood loss. Symptoms have Unchanged. Severity has been struggles to carry out day to day responsibilities.. Previous visits for this problem: none.   Patient is an 83 y/o woman with history of HTN on 160-12.5 Diovan HCT, primary pulmonary hypertension, and anxiety presenting today with the above complaints. She reports home readings of her blood pressure have been elevated. She has some dizziness when moving from sitting to standing positions, but otherwise not. Does have anxiety for which she takes PRN Ativan 0.5mg . Additionally she reports fatigue ongoing for several weeks. She reports having no appetite and eatings some. She has been sleeping poorly, waking up at 3AM and staying up several hours before falling back asleep. She reports generalized fatigue when doing every day activities. Has had holter monitor/echo performed in 2008, uncertain about results. Reports her home monitor tells her she has irregular heartbeats. Denies PND, ankle  edema, cough.  No Known Allergies   Current Outpatient Prescriptions:  .  aspirin EC 81 MG tablet, Take 81 mg by mouth daily., Disp: , Rfl:  .  Calcium Carbonate-Vitamin D 600-400 MG-UNIT per tablet, CALTRATE 600+D, 600-400MG -UNIT (Oral Tablet)  1 Every Day for 0 days  Quantity: 0.00;  Refills: 0   Ordered :12-Jan-2010  [name] ;  Started 15-Aug-2008 Active Comments: DX: 733.90, Disp: , Rfl:  .  fluticasone (FLONASE) 50 MCG/ACT nasal spray, Place 1 spray into the nose daily as needed. , Disp: , Rfl:  .  LORazepam (ATIVAN) 0.5 MG tablet, Take one tablet twice a day; and then once every six hours as needed., Disp: 120 tablet, Rfl: 1 .  potassium chloride SA (K-DUR,KLOR-CON) 20 MEQ tablet, TAKE ONE TABLET TWICE DAILY, Disp: 180 tablet, Rfl: 1 .  valsartan-hydrochlorothiazide (DIOVAN-HCT) 160-12.5 MG tablet, TAKE ONE (1) TABLET BY MOUTH EVERY DAY, Disp: 30 tablet, Rfl: 5  Review of Systems  Constitutional: Positive for fatigue and malaise/fatigue. Negative for activity change, appetite change, chills, diaphoresis, fever and unexpected weight change.  Eyes: Negative for blurred vision.  Respiratory: Positive for shortness of breath. Negative for apnea, cough, choking, chest tightness, wheezing and stridor.   Cardiovascular: Negative.  Negative for chest pain and palpitations.  Gastrointestinal: Negative.   Neurological: Positive for dizziness. Negative for tremors, light-headedness, numbness and headaches.  Hematological: Does not bruise/bleed easily.  Psychiatric/Behavioral: The patient is nervous/anxious.     Social History  Substance Use Topics  . Smoking status: Never Smoker  . Smokeless tobacco: Never Used  . Alcohol use No   Objective:   BP (!) 158/80 (BP Location: Left Arm, Patient Position: Sitting, Cuff  Size: Normal)   Pulse 88   Temp 98 F (36.7 C) (Oral)   Resp 16   Wt 110 lb (49.9 kg)   BMI 18.88 kg/m   Physical Exam  Constitutional: She is oriented to person,  place, and time. She appears well-developed and well-nourished. No distress.  Cardiovascular: Normal rate, regular rhythm, normal heart sounds and intact distal pulses.  Exam reveals no gallop and no friction rub.   No murmur heard. Pulmonary/Chest: Effort normal and breath sounds normal. No respiratory distress. She has no wheezes. She has no rales. She exhibits no tenderness.  Abdominal: Soft. Bowel sounds are normal.  Musculoskeletal: She exhibits no edema.  Neurological: She is alert and oriented to person, place, and time.  Skin: Skin is warm and dry.  Psychiatric: She has a normal mood and affect. Her behavior is normal.        Assessment & Plan:     1. Benign hypertension  Will check labs as below. Have increased Diovan to 320-12.5 daily as below. Come back in two weeks to recheck Bp. Patient to call if having adverse side effects.  - Comprehensive metabolic panel  2. Fatigue, unspecified type  Will check for anemia, last CBC was slightly anemic.  - CBC with Differential/Platelet  3. Hypokalemia  See above. Refilled potassium.  - valsartan-hydrochlorothiazide (DIOVAN HCT) 320-12.5 MG tablet; Take 1 tablet by mouth daily.  Dispense: 30 tablet; Refill: 2 - potassium chloride SA (K-DUR,KLOR-CON) 20 MEQ tablet; Take 1 tablet (20 mEq total) by mouth 2 (two) times daily.  Dispense: 60 tablet; Refill: 2 - Comprehensive metabolic panel  4. Irregular heartbeat  Referral to cardiology 2/2 fatigue, feeling of irregular heart beat, (+) family history of heart disease, and history of pulmonary hypertension.  - Ambulatory referral to Cardiology  Return in about 2 weeks (around 05/13/2016) for BP check.   Patient Instructions  Hypertension Hypertension is another name for high blood pressure. High blood pressure forces your heart to work harder to pump blood. A blood pressure reading has two numbers, which includes a higher number over a lower number (example: 110/72). Follow  these instructions at home:  Have your blood pressure rechecked by your doctor.  Only take medicine as told by your doctor. Follow the directions carefully. The medicine does not work as well if you skip doses. Skipping doses also puts you at risk for problems.  Do not smoke.  Monitor your blood pressure at home as told by your doctor. Contact a doctor if:  You think you are having a reaction to the medicine you are taking.  You have repeat headaches or feel dizzy.  You have puffiness (swelling) in your ankles.  You have trouble with your vision. Get help right away if:  You get a very bad headache and are confused.  You feel weak, numb, or faint.  You get chest or belly (abdominal) pain.  You throw up (vomit).  You cannot breathe very well. This information is not intended to replace advice given to you by your health care provider. Make sure you discuss any questions you have with your health care provider. Document Released: 09/04/2007 Document Revised: 08/24/2015 Document Reviewed: 01/08/2013 Elsevier Interactive Patient Education  2017 [employer]   The entirety of the information documented in the History of Present Illness, Review of Systems and Physical Exam were personally obtained by me. Portions of this information were initially documented by Kavin LeechLaura Francesca Strome, CMA and reviewed by me for thoroughness and accuracy.  Trinna Post, PA-C  [facility]

## 2016-04-29 NOTE — Patient Instructions (Signed)
Hypertension Hypertension is another name for high blood pressure. High blood pressure forces your heart to work harder to pump blood. A blood pressure reading has two numbers, which includes a higher number over a lower number (example: 110/72). Follow these instructions at home:  Have your blood pressure rechecked by your doctor.  Only take medicine as told by your doctor. Follow the directions carefully. The medicine does not work as well if you skip doses. Skipping doses also puts you at risk for problems.  Do not smoke.  Monitor your blood pressure at home as told by your doctor. Contact a doctor if:  You think you are having a reaction to the medicine you are taking.  You have repeat headaches or feel dizzy.  You have puffiness (swelling) in your ankles.  You have trouble with your vision. Get help right away if:  You get a very bad headache and are confused.  You feel weak, numb, or faint.  You get chest or belly (abdominal) pain.  You throw up (vomit).  You cannot breathe very well. This information is not intended to replace advice given to you by your health care provider. Make sure you discuss any questions you have with your health care provider. Document Released: 09/04/2007 Document Revised: 08/24/2015 Document Reviewed: 01/08/2013 Elsevier Interactive Patient Education  2017 Elsevier Inc.  

## 2016-04-30 NOTE — Telephone Encounter (Signed)
Adriana refilled this prescription yesterday.

## 2016-04-30 NOTE — Telephone Encounter (Signed)
Please review, jennis patient-aa 

## 2016-05-03 ENCOUNTER — Telehealth: Payer: Self-pay

## 2016-05-03 LAB — CBC WITH DIFFERENTIAL/PLATELET
Basophils Absolute: 0 10*3/uL (ref 0.0–0.2)
Basos: 1 %
EOS (ABSOLUTE): 0.1 10*3/uL (ref 0.0–0.4)
Eos: 2 %
Hematocrit: 42.9 % (ref 34.0–46.6)
Hemoglobin: 14.1 g/dL (ref 11.1–15.9)
Immature Grans (Abs): 0 10*3/uL (ref 0.0–0.1)
Immature Granulocytes: 0 %
Lymphocytes Absolute: 1.9 10*3/uL (ref 0.7–3.1)
Lymphs: 32 %
MCH: 30.1 pg (ref 26.6–33.0)
MCHC: 32.9 g/dL (ref 31.5–35.7)
MCV: 92 fL (ref 79–97)
Monocytes Absolute: 0.6 10*3/uL (ref 0.1–0.9)
Monocytes: 9 %
Neutrophils Absolute: 3.3 10*3/uL (ref 1.4–7.0)
Neutrophils: 56 %
Platelets: 258 10*3/uL (ref 150–379)
RBC: 4.68 x10E6/uL (ref 3.77–5.28)
RDW: 13.8 % (ref 12.3–15.4)
WBC: 6 10*3/uL (ref 3.4–10.8)

## 2016-05-03 LAB — COMPREHENSIVE METABOLIC PANEL
ALT: 9 IU/L (ref 0–32)
AST: 20 IU/L (ref 0–40)
Albumin/Globulin Ratio: 1.7 (ref 1.2–2.2)
Albumin: 4.3 g/dL (ref 3.5–4.7)
Alkaline Phosphatase: 73 IU/L (ref 39–117)
BUN/Creatinine Ratio: 16 (ref 12–28)
BUN: 17 mg/dL (ref 8–27)
Bilirubin Total: 0.7 mg/dL (ref 0.0–1.2)
CO2: 26 mmol/L (ref 18–29)
Calcium: 10 mg/dL (ref 8.7–10.3)
Chloride: 99 mmol/L (ref 96–106)
Creatinine, Ser: 1.04 mg/dL — ABNORMAL HIGH (ref 0.57–1.00)
GFR calc Af Amer: 57 mL/min/{1.73_m2} — ABNORMAL LOW (ref >59)
GFR calc non Af Amer: 50 mL/min/{1.73_m2} — ABNORMAL LOW (ref >59)
Globulin, Total: 2.6 g/dL (ref 1.5–4.5)
Glucose: 95 mg/dL (ref 65–99)
Potassium: 4.2 mmol/L (ref 3.5–5.2)
Sodium: 137 mmol/L (ref 134–144)
Total Protein: 6.9 g/dL (ref 6.0–8.5)

## 2016-05-03 NOTE — Telephone Encounter (Signed)
LMTCB 05/03/2016  Thanks,   -Dilcia Rybarczyk  

## 2016-05-03 NOTE — Telephone Encounter (Signed)
-----   Message from Trey SailorsAdriana M Pollak, [place]PA-C sent at 05/03/2016 10:03 AM EST ----- CBC looks normal. CMET showed slight change in kidney function, likely 2/2 dehydration. Potassium normal. Would like patient to push plenty of fluids and we will recheck this in a few weeks.

## 2016-05-03 NOTE — Telephone Encounter (Signed)
Pt advised.   Thanks,   -Lashon Beringer  

## 2016-05-13 ENCOUNTER — Encounter: Payer: Self-pay | Admitting: Physician Assistant

## 2016-05-13 ENCOUNTER — Ambulatory Visit (INDEPENDENT_AMBULATORY_CARE_PROVIDER_SITE_OTHER): Payer: Medicare Other | Admitting: Physician Assistant

## 2016-05-13 VITALS — BP 158/72 | HR 72 | Temp 98.3°F | Resp 16 | Wt 112.0 lb

## 2016-05-13 DIAGNOSIS — R002 Palpitations: Secondary | ICD-10-CM | POA: Diagnosis not present

## 2016-05-13 DIAGNOSIS — R944 Abnormal results of kidney function studies: Secondary | ICD-10-CM

## 2016-05-13 DIAGNOSIS — I10 Essential (primary) hypertension: Secondary | ICD-10-CM

## 2016-05-13 NOTE — Patient Instructions (Signed)
Hypertension Hypertension, commonly called high blood pressure, is when the force of blood pumping through your arteries is too strong. Your arteries are the blood vessels that carry blood from your heart throughout your body. A blood pressure reading consists of a higher number over a lower number, such as 110/72. The higher number (systolic) is the pressure inside your arteries when your heart pumps. The lower number (diastolic) is the pressure inside your arteries when your heart relaxes. Ideally you want your blood pressure below 120/80. Hypertension forces your heart to work harder to pump blood. Your arteries may become narrow or stiff. Having untreated or uncontrolled hypertension can cause heart attack, stroke, kidney disease, and other problems. What increases the risk? Some risk factors for high blood pressure are controllable. Others are not. Risk factors you cannot control include:  Race. You may be at higher risk if you are African American.  Age. Risk increases with age.  Gender. Men are at higher risk than women before age 45 years. After age 65, women are at higher risk than men. Risk factors you can control include:  Not getting enough exercise or physical activity.  Being overweight.  Getting too much fat, sugar, calories, or salt in your diet.  Drinking too much alcohol. What are the signs or symptoms? Hypertension does not usually cause signs or symptoms. Extremely high blood pressure (hypertensive crisis) may cause headache, anxiety, shortness of breath, and nosebleed. How is this diagnosed? To check if you have hypertension, your health care provider will measure your blood pressure while you are seated, with your arm held at the level of your heart. It should be measured at least twice using the same arm. Certain conditions can cause a difference in blood pressure between your right and left arms. A blood pressure reading that is higher than normal on one occasion does  not mean that you need treatment. If it is not clear whether you have high blood pressure, you may be asked to return on a different day to have your blood pressure checked again. Or, you may be asked to monitor your blood pressure at home for 1 or more weeks. How is this treated? Treating high blood pressure includes making lifestyle changes and possibly taking medicine. Living a healthy lifestyle can help lower high blood pressure. You may need to change some of your habits. Lifestyle changes may include:  Following the DASH diet. This diet is high in fruits, vegetables, and whole grains. It is low in salt, red meat, and added sugars.  Keep your sodium intake below 2,300 mg per day.  Getting at least 30-45 minutes of aerobic exercise at least 4 times per week.  Losing weight if necessary.  Not smoking.  Limiting alcoholic beverages.  Learning ways to reduce stress. Your health care provider may prescribe medicine if lifestyle changes are not enough to get your blood pressure under control, and if one of the following is true:  You are 18-59 years of age and your systolic blood pressure is above 140.  You are 60 years of age or older, and your systolic blood pressure is above 150.  Your diastolic blood pressure is above 90.  You have diabetes, and your systolic blood pressure is over 140 or your diastolic blood pressure is over 90.  You have kidney disease and your blood pressure is above 140/90.  You have heart disease and your blood pressure is above 140/90. Your personal target blood pressure may vary depending on your medical   conditions, your age, and other factors. Follow these instructions at home:  Have your blood pressure rechecked as directed by your health care provider.  Take medicines only as directed by your health care provider. Follow the directions carefully. Blood pressure medicines must be taken as prescribed. The medicine does not work as well when you skip  doses. Skipping doses also puts you at risk for problems.  Do not smoke.  Monitor your blood pressure at home as directed by your health care provider. Contact a health care provider if:  You think you are having a reaction to medicines taken.  You have recurrent headaches or feel dizzy.  You have swelling in your ankles.  You have trouble with your vision. Get help right away if:  You develop a severe headache or confusion.  You have unusual weakness, numbness, or feel faint.  You have severe chest or abdominal pain.  You vomit repeatedly.  You have trouble breathing. This information is not intended to replace advice given to you by your health care provider. Make sure you discuss any questions you have with your health care provider. Document Released: 03/18/2005 Document Revised: 08/24/2015 Document Reviewed: 01/08/2013 Elsevier Interactive Patient Education  2017 Elsevier Inc.  

## 2016-05-13 NOTE — Progress Notes (Signed)
Patient: Susan Booth Female    DOB: 01-27-1931   83 y.o.   MRN: [id number] Visit Date: 05/13/2016  Today's Provider: Trey Sailors, PA-C   Chief Complaint  Patient presents with  . Hypertension   Subjective:    HPI  Hypertension, follow-up:  BP Readings from Last 3 Encounters:  05/13/16 (!) 158/72  04/29/16 (!) 158/80  11/25/15 (!) 132/47    She was last seen for hypertension 2 weeks ago.  BP at that visit was 158/80. Management since that visit includes increasing Valsartan-HCTZ to 320-12.5. She reports good compliance with treatment. She is not having side effects.  She is exercising. She is adherent to low salt diet.   Outside blood pressures are checked occasionally. Patient denies exertional chest pressure/discomfort, lower extremity edema and palpitations.     She has taken several home readings, which she says were around 135/81.   She saw Dr. Lady Gary at [facility]ology on 04/30/2016 for irregular heartbeats. In office EKG was normal and so she completed 48 hour Holter monitoring which showed occasional irregular heartbeat. On 2/13/208 she is undergoing a chemical stress test and an echocardiogram.  Weight trend: stable Wt Readings from Last 3 Encounters:  05/13/16 112 lb (50.8 kg)  04/29/16 110 lb (49.9 kg)  11/24/15 110 lb 0.2 oz (49.9 kg)    Current diet: well balanced        No Known Allergies   Current Outpatient Prescriptions:  .  aspirin EC 81 MG tablet, Take 81 mg by mouth daily., Disp: , Rfl:  .  Calcium Carbonate-Vitamin D 600-400 MG-UNIT per tablet, CALTRATE 600+D, 600-400MG -UNIT (Oral Tablet)  1 Every Day for 0 days  Quantity: 0.00;  Refills: 0   Ordered :12-Jan-2010  [name] ;  Started 15-Aug-2008 Active Comments: DX: 733.90, Disp: , Rfl:  .  fluticasone (FLONASE) 50 MCG/ACT nasal spray, Place 1 spray into the nose daily as needed. , Disp: , Rfl:  .  LORazepam (ATIVAN) 0.5 MG tablet, Take one tablet twice a day; and  then once every six hours as needed., Disp: 120 tablet, Rfl: 1 .  potassium chloride SA (K-DUR,KLOR-CON) 20 MEQ tablet, Take 1 tablet (20 mEq total) by mouth 2 (two) times daily., Disp: 60 tablet, Rfl: 2 .  valsartan-hydrochlorothiazide (DIOVAN HCT) 320-12.5 MG tablet, Take 1 tablet by mouth daily., Disp: 30 tablet, Rfl: 2  Review of Systems  Constitutional: Negative.   Respiratory: Negative for cough, choking, chest tightness, shortness of breath, wheezing and stridor.   Cardiovascular: Negative for chest pain, palpitations and leg swelling.    Social History  Substance Use Topics  . Smoking status: Never Smoker  . Smokeless tobacco: Never Used  . Alcohol use No   Objective:   BP (!) 158/72 (BP Location: Left Arm, Patient Position: Sitting, Cuff Size: Normal)   Pulse 72   Temp 98.3 F (36.8 C)   Resp 16   Wt 112 lb (50.8 kg)   SpO2 98%   BMI 19.22 kg/m   BP on recheck was 140/72  Physical Exam  Constitutional: She is oriented to person, place, and time. She appears well-developed and well-nourished.  Cardiovascular: Normal rate, regular rhythm, normal heart sounds and intact distal pulses.  Exam reveals no gallop and no friction rub.   No murmur heard. Pulmonary/Chest: Effort normal and breath sounds normal. No respiratory distress. She has no wheezes. She has no rales.  Neurological: She is alert and oriented to person, place,  and time.  Skin: Skin is warm and dry.  Psychiatric: She has a normal mood and affect. Her behavior is normal.  Vitals reviewed.       Assessment & Plan:     1. Benign hypertension  Patient presenting today for BP recheck. She says symptomatically she feels better, is less dizzy. Not having any H/A, vision change, problems urinating. BP on recheck in office was 140/72, which is improved from last visit. I will leave her at the same dose of her BP medications as she says her home readings are even lower. Was evaluated 04/30/2016 by cardiology for  palpitations. She is undergoing stress testing and echo tomorrow with [facility] and she has an appointment with cardiology on 05/20/2016 where they can check her BP again and make adjustments as necessary. She can follow up here if her medication is adjusted then.  2. Decreased GFR  Last CMET showed drop in GFR and increased serum creatinine. Suspect dehydration and will recheck when adequately hydrated.  - Basic Metabolic Panel (BMET); Future  3. Heart palpitations  See #1  Return if symptoms worsen or fail to improve.   Patient Instructions  Hypertension Hypertension, commonly called high blood pressure, is when the force of blood pumping through your arteries is too strong. Your arteries are the blood vessels that carry blood from your heart throughout your body. A blood pressure reading consists of a higher number over a lower number, such as 110/72. The higher number (systolic) is the pressure inside your arteries when your heart pumps. The lower number (diastolic) is the pressure inside your arteries when your heart relaxes. Ideally you want your blood pressure below 120/80. Hypertension forces your heart to work harder to pump blood. Your arteries may become narrow or stiff. Having untreated or uncontrolled hypertension can cause heart attack, stroke, kidney disease, and other problems. What increases the risk? Some risk factors for high blood pressure are controllable. Others are not. Risk factors you cannot control include:  Race. You may be at higher risk if you are African American.  Age. Risk increases with age.  Gender. Men are at higher risk than women before age 35 years. After age 25, women are at higher risk than men. Risk factors you can control include:  Not getting enough exercise or physical activity.  Being overweight.  Getting too much fat, sugar, calories, or salt in your diet.  Drinking too much alcohol. What are the signs or  symptoms? Hypertension does not usually cause signs or symptoms. Extremely high blood pressure (hypertensive crisis) may cause headache, anxiety, shortness of breath, and nosebleed. How is this diagnosed? To check if you have hypertension, your health care provider will measure your blood pressure while you are seated, with your arm held at the level of your heart. It should be measured at least twice using the same arm. Certain conditions can cause a difference in blood pressure between your right and left arms. A blood pressure reading that is higher than normal on one occasion does not mean that you need treatment. If it is not clear whether you have high blood pressure, you may be asked to return on a different day to have your blood pressure checked again. Or, you may be asked to monitor your blood pressure at home for 1 or more weeks. How is this treated? Treating high blood pressure includes making lifestyle changes and possibly taking medicine. Living a healthy lifestyle can help lower high blood pressure. You may need  to change some of your habits. Lifestyle changes may include:  Following the DASH diet. This diet is high in fruits, vegetables, and whole grains. It is low in salt, red meat, and added sugars.  Keep your sodium intake below 2,300 mg per day.  Getting at least 30-45 minutes of aerobic exercise at least 4 times per week.  Losing weight if necessary.  Not smoking.  Limiting alcoholic beverages.  Learning ways to reduce stress. Your health care provider may prescribe medicine if lifestyle changes are not enough to get your blood pressure under control, and if one of the following is true:  You are 4518-159 years of age and your systolic blood pressure is above 140.  You are 81 years of age or older, and your systolic blood pressure is above 150.  Your diastolic blood pressure is above 90.  You have diabetes, and your systolic blood pressure is over 140 or your diastolic  blood pressure is over 90.  You have kidney disease and your blood pressure is above 140/90.  You have heart disease and your blood pressure is above 140/90. Your personal target blood pressure may vary depending on your medical conditions, your age, and other factors. Follow these instructions at home:  Have your blood pressure rechecked as directed by your health care provider.  Take medicines only as directed by your health care provider. Follow the directions carefully. Blood pressure medicines must be taken as prescribed. The medicine does not work as well when you skip doses. Skipping doses also puts you at risk for problems.  Do not smoke.  Monitor your blood pressure at home as directed by your health care provider. Contact a health care provider if:  You think you are having a reaction to medicines taken.  You have recurrent headaches or feel dizzy.  You have swelling in your ankles.  You have trouble with your vision. Get help right away if:  You develop a severe headache or confusion.  You have unusual weakness, numbness, or feel faint.  You have severe chest or abdominal pain.  You vomit repeatedly.  You have trouble breathing. This information is not intended to replace advice given to you by your health care provider. Make sure you discuss any questions you have with your health care provider. Document Released: 03/18/2005 Document Revised: 08/24/2015 Document Reviewed: 01/08/2013 Elsevier Interactive Patient Education  2017 [employer]    The entirety of the information documented in the History of Present Illness, Review of Systems and Physical Exam were personally obtained by me. Portions of this information were initially documented by [facility] and reviewed by me for thoroughness and accuracy.             Trey SailorsAdriana M Pollak, PA-C  [facility]

## 2016-05-16 ENCOUNTER — Ambulatory Visit (INDEPENDENT_AMBULATORY_CARE_PROVIDER_SITE_OTHER): Payer: Medicare Other | Admitting: Obstetrics and Gynecology

## 2016-05-16 ENCOUNTER — Ambulatory Visit: Payer: Medicare Other | Admitting: Obstetrics and Gynecology

## 2016-05-16 ENCOUNTER — Encounter: Payer: Self-pay | Admitting: Obstetrics and Gynecology

## 2016-05-16 VITALS — BP 164/52 | HR 89 | Ht 64.0 in | Wt 110.2 lb

## 2016-05-16 DIAGNOSIS — L900 Lichen sclerosus et atrophicus: Secondary | ICD-10-CM | POA: Diagnosis not present

## 2016-05-16 DIAGNOSIS — L290 Pruritus ani: Secondary | ICD-10-CM | POA: Diagnosis not present

## 2016-05-16 DIAGNOSIS — N644 Mastodynia: Secondary | ICD-10-CM | POA: Diagnosis not present

## 2016-05-16 NOTE — Patient Instructions (Signed)
1. Continue using Temovate ointment 0.05% topically to the vulva once twice a week 2. Return in 6 months for follow-up on lichen sclerosus 3. Monitor left breast symptoms; when they recur, call for follow-up appointment

## 2016-05-16 NOTE — Progress Notes (Signed)
Chief complaint: 1. Lichen sclerosus 2. Rectal itching 3. Left breast pain  Patient presents for 6 month follow-up on lichen sclerosus of the vulva and perianal region. She has been using Temovate ointment 0.05% topically twice a week with good control of irritative symptomatology. She does note occasional perianal burning. Bowel movements are normal.  Other concerns today is left breast pain described as a burning and irritation within the lateral aspect of the breast. She has not noted any significant skin rash of the breast. She denies nipple discharge. She does not recall any vesicles being performed on the skin in a linear distribution along the dermatomes. She is status post shingles vaccine. Husband is currently battling shingles despite having gotten the injection.  Past history, past surgical history, problem list, medications, and allergies are reviewed  OBJECTIVE: BP (!) 164/52   Pulse 89   Ht 5\' 4"  (1.626 m)   Wt 110 lb 3.2 oz (50 kg)   BMI 18.92 kg/m  Pleasant female in no acute distress. She is alert and oriented. Breasts: Left-normal appearance without in changes or nipple discharge. No significant palpable mass or tenderness Abdomen: Soft, nontender without organomegaly Pelvic exam: Unchanged External genitalia-atrophic changes with no epithelial skin breakdown; no leukoplakia; multiple Cherry hemangiomas in the vulva BUS-normal Vagina-no discharge; atrophy present Bimanual-not done RECTOVAGINAL-No hyperemia; no satellite lesions or epithelial skin breakdown  ASSESSMENT: 1. Lichen sclerosus, intermittently symptomatic without significant pathologic skin changes 2. Vulvar atrophy 3. Vaginal atrophy 4. History of breast pain, asymptomatic today, with normal exam  PLAN: 1. Continue with Temovate ointment 0.05% twice a week 2. Monitor breast pain symptomatology and return as needed if symptoms recur 3. Return in 6 months for follow-up on lichen sclerosus  A total of  15 minutes were spent face-to-face with the patient during this encounter and over half of that time dealt with counseling and coordination of care.  Herold HarmsMartin A Ponciano Shealy, MD  Note: This dictation was prepared with Dragon dictation along with smaller phrase technology. Any transcriptional errors that result from this process are unintentional.

## 2016-05-20 NOTE — Addendum Note (Signed)
Addended by: Tomasita CrumbleROZDOWSKI, Blakleigh Straw M on: 05/20/2016 04:12 PM   Modules accepted: Orders

## 2016-05-22 ENCOUNTER — Telehealth: Payer: Self-pay

## 2016-05-22 LAB — BASIC METABOLIC PANEL
BUN/Creatinine Ratio: 15 (ref 12–28)
BUN: 13 mg/dL (ref 8–27)
CO2: 25 mmol/L (ref 18–29)
Calcium: 9.5 mg/dL (ref 8.7–10.3)
Chloride: 98 mmol/L (ref 96–106)
Creatinine, Ser: 0.88 mg/dL (ref 0.57–1.00)
GFR calc Af Amer: 70 (ref >59)
GFR calc non Af Amer: 61 (ref >59)
Glucose: 111 mg/dL — ABNORMAL HIGH (ref 65–99)
Potassium: 3.8 mmol/L (ref 3.5–5.2)
Sodium: 138 mmol/L (ref 134–144)

## 2016-05-22 NOTE — Telephone Encounter (Signed)
-----   Message from Trey SailorsAdriana M Pollak, [place]PA-C sent at 05/22/2016  8:21 AM EST ----- Kidney function has normalized, thank you for repeating this lab. Did patient end up going to her cardiology procedures? Her blood pressure was still high on her gynecology visit.

## 2016-05-22 NOTE — Telephone Encounter (Signed)
Pt advised.  She did go to cardiology (See note)  Thanks,   -Vernona RiegerLaura

## 2016-06-17 ENCOUNTER — Other Ambulatory Visit: Payer: Self-pay

## 2016-06-17 DIAGNOSIS — F419 Anxiety disorder, unspecified: Secondary | ICD-10-CM

## 2016-06-17 MED ORDER — LORAZEPAM 0.5 MG PO TABS: ORAL_TABLET | ORAL | 1 refills | Status: DC

## 2016-06-17 NOTE — Telephone Encounter (Signed)
[facility]  Thanks,  -Burgess Sheriff

## 2016-06-17 NOTE — Telephone Encounter (Signed)
Called in to [facility] 

## 2016-07-25 ENCOUNTER — Other Ambulatory Visit: Payer: Self-pay | Admitting: Physician Assistant

## 2016-07-25 DIAGNOSIS — E876 Hypokalemia: Secondary | ICD-10-CM

## 2016-07-26 ENCOUNTER — Other Ambulatory Visit: Payer: Self-pay | Admitting: Physician Assistant

## 2016-07-26 DIAGNOSIS — E876 Hypokalemia: Secondary | ICD-10-CM

## 2016-08-16 ENCOUNTER — Ambulatory Visit (INDEPENDENT_AMBULATORY_CARE_PROVIDER_SITE_OTHER): Payer: Medicare Other | Admitting: Physician Assistant

## 2016-08-16 ENCOUNTER — Encounter: Payer: Self-pay | Admitting: Physician Assistant

## 2016-08-16 VITALS — BP 140/70 | HR 77 | Temp 97.8°F | Resp 16 | Wt 105.0 lb

## 2016-08-16 DIAGNOSIS — B029 Zoster without complications: Secondary | ICD-10-CM | POA: Diagnosis not present

## 2016-08-16 MED ORDER — VALACYCLOVIR HCL 1 G PO TABS: 1000.0000 mg | ORAL_TABLET | Freq: Three times a day (TID) | ORAL | 0 refills | Status: DC

## 2016-08-16 NOTE — Patient Instructions (Signed)
Shingles Shingles, which is also known as herpes zoster, is an infection that causes a painful skin rash and fluid-filled blisters. Shingles is not related to genital herpes, which is a sexually transmitted infection. Shingles only develops in people who:  Have had chickenpox.  Have received the chickenpox vaccine. (This is rare.)  What are the causes? Shingles is caused by varicella-zoster virus (VZV). This is the same virus that causes chickenpox. After exposure to VZV, the virus stays in the body in an inactive (dormant) state. Shingles develops if the virus reactivates. This can happen many years after the initial exposure to VZV. It is not known what causes this virus to reactivate. What increases the risk? People who have had chickenpox or received the chickenpox vaccine are at risk for shingles. Infection is more common in people who:  Are older than age 50.  Have a weakened defense (immune) system, such as those with HIV, AIDS, or cancer.  Are taking medicines that weaken the immune system, such as transplant medicines.  Are under great stress.  What are the signs or symptoms? Early symptoms of this condition include itching, tingling, and pain in an area on your skin. Pain may be described as burning, stabbing, or throbbing. A few days or weeks after symptoms start, a painful red rash appears, usually on one side of the body in a bandlike or beltlike pattern. The rash eventually turns into fluid-filled blisters that break open, scab over, and dry up in about 2-3 weeks. At any time during the infection, you may also develop:  A fever.  Chills.  A headache.  An upset stomach.  How is this diagnosed? This condition is diagnosed with a skin exam. Sometimes, skin or fluid samples are taken from the blisters before a diagnosis is made. These samples are examined under a microscope or sent to a lab for testing. How is this treated? There is no specific cure for this condition.  Your health care provider will probably prescribe medicines to help you manage pain, recover more quickly, and avoid long-term problems. Medicines may include:  Antiviral drugs.  Anti-inflammatory drugs.  Pain medicines.  If the area involved is on your face, you may be referred to a specialist, such as an eye doctor (ophthalmologist) or an ear, nose, and throat (ENT) doctor to help you avoid eye problems, chronic pain, or disability. Follow these instructions at home: Medicines  Take medicines only as directed by your health care provider.  Apply an anti-itch or numbing cream to the affected area as directed by your health care provider. Blister and Rash Care  Take a cool bath or apply cool compresses to the area of the rash or blisters as directed by your health care provider. This may help with pain and itching.  Keep your rash covered with a loose bandage (dressing). Wear loose-fitting clothing to help ease the pain of material rubbing against the rash.  Keep your rash and blisters clean with mild soap and cool water or as directed by your health care provider.  Check your rash every day for signs of infection. These include redness, swelling, and pain that lasts or increases.  Do not pick your blisters.  Do not scratch your rash. General instructions  Rest as directed by your health care provider.  Keep all follow-up visits as directed by your health care provider. This is important.  Until your blisters scab over, your infection can cause chickenpox in people who have never had it or been vaccinated   against it. To prevent this from happening, avoid contact with other people, especially: ? Babies. ? Pregnant women. ? Children who have eczema. ? Elderly people who have transplants. ? People who have chronic illnesses, such as leukemia or AIDS. Contact a health care provider if:  Your pain is not relieved with prescribed medicines.  Your pain does not get better after  the rash heals.  Your rash looks infected. Signs of infection include redness, swelling, and pain that lasts or increases. Get help right away if:  The rash is on your face or nose.  You have facial pain, pain around your eye area, or loss of feeling on one side of your face.  You have ear pain or you have ringing in your ear.  You have loss of taste.  Your condition gets worse. This information is not intended to replace advice given to you by your health care provider. Make sure you discuss any questions you have with your health care provider. Document Released: 03/18/2005 Document Revised: 11/12/2015 Document Reviewed: 01/27/2014 Elsevier Interactive Patient Education  2017 Elsevier Inc.  

## 2016-08-16 NOTE — Progress Notes (Signed)
Patient: Susan Booth Female    DOB: 07-22-1930   83 y.o.   MRN: [id number] Visit Date: 08/16/2016  Today's Provider: Margaretann Loveless, PA-C   Chief Complaint  Patient presents with  . Rash   Subjective:    Rash  This is a new problem. The current episode started in the past 7 days (She reports that about a week ago, she woke up with pain in her arm.She had a lot of itching on Tuesday). The problem has been gradually worsening since onset. The affected locations include the left elbow, left arm and left shoulder. The rash is characterized by itchiness, pain, redness and burning. It is unknown ("a week before,I was gardening") if there was an exposure to a precipitant. Associated symptoms include fatigue. Pertinent negatives include no congestion, cough, diarrhea, eye pain, fever, joint pain, rhinorrhea, shortness of breath, sore throat or vomiting. Past treatments include topical steroids (Hydrocortisone 1%). The treatment provided no relief.  She did have zostavax in 2012.    No Known Allergies   Current Outpatient Prescriptions:  .  aspirin EC 81 MG tablet, Take 81 mg by mouth daily., Disp: , Rfl:  .  Calcium Carbonate-Vitamin D 600-400 MG-UNIT per tablet, CALTRATE 600+D, 600-400MG -UNIT (Oral Tablet)  1 Every Day for 0 days  Quantity: 0.00;  Refills: 0   Ordered :12-Jan-2010  [name] ;  Started 15-Aug-2008 Active Comments: DX: 733.90, Disp: , Rfl:  .  fluticasone (FLONASE) 50 MCG/ACT nasal spray, Place 1 spray into the nose daily as needed. , Disp: , Rfl:  .  LORazepam (ATIVAN) 0.5 MG tablet, Take one tablet twice a day; and then once every six hours as needed., Disp: 120 tablet, Rfl: 1 .  potassium chloride SA (K-DUR,KLOR-CON) 20 MEQ tablet, TAKE ONE (1) TABLET BY MOUTH TWO (2) TIMES DAILY, Disp: 60 tablet, Rfl: 5 .  valsartan-hydrochlorothiazide (DIOVAN-HCT) 320-12.5 MG tablet, TAKE ONE (1) TABLET BY MOUTH EVERY DAY, Disp: 30 tablet, Rfl: 5 .  amoxicillin (AMOXIL)  500 MG capsule, Take 500 mg by mouth 3 (three) times daily., Disp: , Rfl:   Review of Systems  Constitutional: Positive for chills and fatigue. Negative for fever.  HENT: Negative for congestion, rhinorrhea and sore throat.   Eyes: Negative for pain.  Respiratory: Negative for cough and shortness of breath.   Cardiovascular: Negative for chest pain, palpitations and leg swelling.  Gastrointestinal: Negative for diarrhea and vomiting.  Musculoskeletal: Negative for joint pain.  Skin: Positive for rash.    Social History  Substance Use Topics  . Smoking status: Never Smoker  . Smokeless tobacco: Never Used  . Alcohol use No   Objective:   BP 140/70 (BP Location: Right Arm, Patient Position: Sitting, Cuff Size: Normal)   Pulse 77   Temp 97.8 F (36.6 C) (Oral)   Resp 16   Wt 105 lb (47.6 kg)   SpO2 98%   BMI 18.02 kg/m    Physical Exam  Constitutional: She appears well-developed and well-nourished. No distress.  Neck: Normal range of motion. Neck supple.  Cardiovascular: Normal rate, regular rhythm and normal heart sounds.  Exam reveals no gallop and no friction rub.   No murmur heard. Pulmonary/Chest: Effort normal and breath sounds normal. No respiratory distress. She has no wheezes. She has no rales.  Lymphadenopathy:    She has no cervical adenopathy.  Skin: Rash noted. Rash is maculopapular. She is not diaphoretic.     Vitals reviewed.  Assessment & Plan:     1. Herpes zoster without complication Will treat with valtrex as below. She is to continue tylenol and IBU alternating as needed for pain as this has been helping. She is to call if pain worsens. Advised to avoid certain populations until rash clears. She voices understanding. - valACYclovir (VALTREX) 1000 MG tablet; Take 1 tablet (1,000 mg total) by mouth 3 (three) times daily.  Dispense: 21 tablet; Refill: 0       Margaretann LovelessJennifer M Bentlie Catanzaro, PA-C  [facility]

## 2016-10-11 ENCOUNTER — Telehealth: Payer: Self-pay | Admitting: Physician Assistant

## 2016-10-15 ENCOUNTER — Other Ambulatory Visit: Payer: Self-pay | Admitting: Physician Assistant

## 2016-10-15 ENCOUNTER — Telehealth: Payer: Self-pay | Admitting: Physician Assistant

## 2016-10-15 DIAGNOSIS — E876 Hypokalemia: Secondary | ICD-10-CM

## 2016-10-15 DIAGNOSIS — I10 Essential (primary) hypertension: Secondary | ICD-10-CM

## 2016-10-15 NOTE — Telephone Encounter (Signed)
Pt states there has been a recall on her Rx valsartan-hydrochlorothiazide (DIOVAN-HCT) 320-12.5 MG.  Pt is asking if this can be changed to something else.  [facility].  ZO#[phone]/WJCB#[phone]/MW

## 2016-10-16 MED ORDER — IRBESARTAN-HYDROCHLOROTHIAZIDE 300-12.5 MG PO TABS: 1.0000 | ORAL_TABLET | Freq: Every day | ORAL | 0 refills | Status: DC

## 2016-10-16 NOTE — Telephone Encounter (Signed)
Pt advised.   Thanks,   -Quantay Zaremba  

## 2016-10-16 NOTE — Telephone Encounter (Signed)
Sent in equivalent dosing of Avalide, which is irbesartan + HCTZ. She can take this once daily, looks like she has follow up with Dr. Leonard SchwartzB in August, can do BP check then. Thanks.

## 2016-11-12 ENCOUNTER — Ambulatory Visit (INDEPENDENT_AMBULATORY_CARE_PROVIDER_SITE_OTHER): Payer: Medicare Other | Admitting: Obstetrics and Gynecology

## 2016-11-12 ENCOUNTER — Encounter: Payer: Self-pay | Admitting: Obstetrics and Gynecology

## 2016-11-12 VITALS — BP 144/74 | HR 82 | Ht 64.0 in | Wt 102.6 lb

## 2016-11-12 DIAGNOSIS — L900 Lichen sclerosus et atrophicus: Secondary | ICD-10-CM | POA: Diagnosis not present

## 2016-11-12 NOTE — Patient Instructions (Signed)
1. Continue applying Temovate ointment 0.05% topically to the vulva once a week 2. Return in 6 months for follow-up

## 2016-11-12 NOTE — Progress Notes (Signed)
Chief complaint: 1. Lichen sclerosus  Patient presents for 6 month follow-up on lichen sclerosus of the vulva and perianal region. She has been using Temovate ointment 0.05% topically once a week with excellent control of her symptomatology. She has no symptomatic complaints at this time.  Past medical history, past surgical history, problem list, medications, and allergies are reviewed  OBJECTIVE: BP (!) 144/74   Pulse 82   Ht 5\' 4"  (1.626 m)   Wt 102 lb 9.6 oz (46.5 kg)   BMI 17.61 kg/m  Pleasant female in no acute distress. She is alert and oriented. Pelvic exam: (Unchanged) External genitalia-atrophic changes with noepithelial skin breakdown; no leukoplakia; multiple Cherryhemangiomas in the vulva. Labia minora agglutinated to the labia majora BUS-normal Vagina-no discharge; atrophy present Bimanual-not done RECTOVAGINAL-No hyperemia; no satellite lesions or epithelial skin breakdown  ASSESSMENT: 1. Lichen sclerosus, stable, asymptomatic  PLAN: 1. Continue applying Temovate ointment 0.05% topically once a week to the vulva 2. Return in 6 months for follow-up   A total of 15 minutes were spent face-to-face with the patient during this encounter and over half of that time dealt with counseling and coordination of care.  Herold HarmsMartin A Defrancesco, MD  Note: This dictation was prepared with Dragon dictation along with smaller phrase technology. Any transcriptional errors that result from this process are unintentional.

## 2016-11-18 ENCOUNTER — Encounter: Payer: Self-pay | Admitting: Family Medicine

## 2016-11-18 ENCOUNTER — Ambulatory Visit (INDEPENDENT_AMBULATORY_CARE_PROVIDER_SITE_OTHER): Payer: Medicare Other | Admitting: Family Medicine

## 2016-11-18 VITALS — BP 140/66 | HR 80 | Temp 97.8°F | Resp 16 | Ht 64.0 in | Wt 102.0 lb

## 2016-11-18 DIAGNOSIS — E876 Hypokalemia: Secondary | ICD-10-CM

## 2016-11-18 DIAGNOSIS — I10 Essential (primary) hypertension: Secondary | ICD-10-CM

## 2016-11-18 DIAGNOSIS — Z1231 Encounter for screening mammogram for malignant neoplasm of breast: Secondary | ICD-10-CM | POA: Diagnosis not present

## 2016-11-18 DIAGNOSIS — Z0000 Encounter for general adult medical examination without abnormal findings: Secondary | ICD-10-CM

## 2016-11-18 DIAGNOSIS — M810 Age-related osteoporosis without current pathological fracture: Secondary | ICD-10-CM | POA: Diagnosis not present

## 2016-11-18 MED ORDER — IRBESARTAN-HYDROCHLOROTHIAZIDE 300-12.5 MG PO TABS: 1.0000 | ORAL_TABLET | Freq: Every day | ORAL | 3 refills | Status: DC

## 2016-11-18 MED ORDER — POTASSIUM CHLORIDE CRYS ER 20 MEQ PO TBCR: EXTENDED_RELEASE_TABLET | ORAL | 5 refills | Status: DC

## 2016-11-18 NOTE — Assessment & Plan Note (Signed)
Patient taking Ca and Vit D She previously did not tolerate Fosamax and does not wish to take any other medications With patient-centered decision making, we decided to not recheck DEXA at this time as it will not change management

## 2016-11-18 NOTE — Assessment & Plan Note (Signed)
Screening mammogram - could consider stopping screening around age 83 given lack of evidence  No DEXA as above Annual flu vaccine recommended Discussed diet and exercise

## 2016-11-18 NOTE — Assessment & Plan Note (Signed)
Recheck CMP Patient taking KDur BID

## 2016-11-18 NOTE — Progress Notes (Signed)
Patient: Susan Booth, Female    DOB: 13-Jul-1930, 83 y.o.   MRN: [id number] Visit Date: 11/18/2016  Today's Provider: Shirlee Latch, MD   Chief Complaint  Patient presents with  . Annual Exam   Subjective:    Annual wellness visit Susan Booth is a 83 y.o. female. She feels well. She reports she is not exercising regularly, but she does stay active with house work, etc. She reports she is sleeping fairly well. She states she is not able to stay asleep. Her son was diagnosed metastasized prostate cancer, which is causing sleep disturbance.   Last AWV- 09/11/2015 Last mammogram- 10/12/2015-BI-RADS 1 Last BMD- 10/27/2013- osteoporosis. No change from 2013. -----------------------------------------------------------  HTN: - Medications: irbesartan-HCTZ 300-12.5mg  daily - Compliance: good - Checking BP at home: yes, but thinks BP machine is inaccurate and old though - Denies any SOB, CP, vision changes, LE edema, medication SEs, or symptoms of hypotension - Diet: low sodium - Exercise: as above   Review of Systems  Constitutional: Negative.   HENT: Negative.   Eyes: Negative.   Respiratory: Negative.   Cardiovascular: Negative.   Gastrointestinal: Negative.   Endocrine: Positive for cold intolerance. Negative for heat intolerance, polydipsia, polyphagia and polyuria.  Genitourinary: Negative.   Musculoskeletal: Negative.   Skin: Negative.   Allergic/Immunologic: Negative.   Neurological: Negative.   Hematological: Negative.   Psychiatric/Behavioral: Negative.     Social History   Social History  . Marital status: Married    Spouse name: Sherrine Maples  . Number of children: 2  . Years of education: Some College   Occupational History  . Retired    Social History Main Topics  . Smoking status: Never Smoker  . Smokeless tobacco: Never Used  . Alcohol use No  . Drug use: No  . Sexual activity: No   Other Topics Concern  . Not on file   Social  History Narrative  . No narrative on file    Past Medical History:  Diagnosis Date  . Anxiety   . Benign essential tremor   . Complication of anesthesia    nausea  . Gastroesophageal reflux disease   . Hypertension   . Lichen sclerosus et atrophicus of the vulva   . Murmur   . Need for SBE (subacute bacterial endocarditis) prophylaxis   . Osteoporosis, post-menopausal   . Other chest pain   . Paresthesia   . Rhinitis, allergic      Patient Active Problem List   Diagnosis Date Noted  . Fatigue 04/29/2016  . Patella fracture 11/24/2015  . Lichen sclerosus 07/04/2015  . Rectal itching 07/04/2015  . Heart palpitations 11/25/2014  . Hypokalemia 09/09/2014  . Lichen sclerosus et atrophicus of the vulva   . Atypical chest pain 08/26/2014  . Paresthesia 08/26/2014  . Allergic rhinitis 07/22/2014  . Anxiety 07/22/2014  . Benign essential tremor 07/22/2014  . Acid reflux 07/22/2014  . OP (osteoporosis) 07/22/2014  . Bone/cartilage disorder 08/19/2008  . Nonrheumatic tricuspid valve disorder 08/19/2008  . Undiagnosed cardiac murmurs 08/15/2008  . Colon, diverticulosis 08/15/2008  . Benign hypertension 08/15/2008  . Primary pulmonary hypertension (HCC) 08/15/2008  . Heart murmur 08/15/2008  . H/O total hysterectomy 04/02/1967    Past Surgical History:  Procedure Laterality Date  . ABDOMINAL HYSTERECTOMY  1968  . ASD REPAIR, SECUNDUM  03/2007  . CATARACT EXTRACTION Bilateral 06/2012  . CORRECTION HAMMER TOE Bilateral 2003  . hole in heart repaired     continue to have  some leakage  . ORIF PATELLA Left 11/24/2015   Procedure: OPEN REDUCTION INTERNAL (ORIF) FIXATION PATELLA;  Surgeon: Juanell Fairly, MD;  Location: [facility] ORS;  Service: Orthopedics;  Laterality: Left;  (PER Dr Rico Ala Spinal would be ideal     Her family history includes Breast cancer (age of onset: 41) in her maternal aunt; COPD in her mother; Early death in her mother; Heart attack in her father; Heart  disease in her father and sister; Lung cancer in her mother.      Current Outpatient Prescriptions:  .  aspirin EC 81 MG tablet, Take 81 mg by mouth daily., Disp: , Rfl:  .  Calcium Carbonate-Vitamin D 600-400 MG-UNIT per tablet, CALTRATE 600+D, 600-400MG -UNIT (Oral Tablet)  1 Every Day for 0 days  Quantity: 0.00;  Refills: 0   Ordered :12-Jan-2010  [name] ;  Started 15-Aug-2008 Active Comments: DX: 733.90, Disp: , Rfl:  .  clobetasol ointment (TEMOVATE) 0.05 %, Apply 1 application topically 2 (two) times daily., Disp: , Rfl:  .  fluticasone (FLONASE) 50 MCG/ACT nasal spray, Place 1 spray into the nose daily as needed. , Disp: , Rfl:  .  irbesartan-hydrochlorothiazide (AVALIDE) 300-12.5 MG tablet, Take 1 tablet by mouth daily., Disp: 90 tablet, Rfl: 3 .  LORazepam (ATIVAN) 0.5 MG tablet, Take one tablet twice a day; and then once every six hours as needed., Disp: 120 tablet, Rfl: 1 .  potassium chloride SA (K-DUR,KLOR-CON) 20 MEQ tablet, TAKE ONE (1) TABLET BY MOUTH TWO (2) TIMES DAILY, Disp: 60 tablet, Rfl: 5 .  amoxicillin (AMOXIL) 500 MG capsule, Take 500 mg by mouth 3 (three) times daily., Disp: , Rfl:   Patient Care Team: Erasmo Downer, MD as PCP - General (Family Medicine)     Objective:   Vitals: BP 140/66 (BP Location: Left Arm, Patient Position: Sitting, Cuff Size: Normal)   Pulse 80   Temp 97.8 F (36.6 C) (Oral)   Resp 16   Ht 5\' 4"  (1.626 m)   Wt 102 lb (46.3 kg)   BMI 17.51 kg/m   Physical Exam  Constitutional: She is oriented to person, place, and time. She appears well-developed and well-nourished. No distress.  HENT:  Head: Normocephalic and atraumatic.  Right Ear: External ear normal.  Left Ear: External ear normal.  Nose: Nose normal.  Mouth/Throat: Oropharynx is clear and moist. No oropharyngeal exudate.  Eyes: Pupils are equal, round, and reactive to light. Conjunctivae are normal. No scleral icterus.  Neck: Neck supple. No thyromegaly present.    Cardiovascular: Normal rate, regular rhythm, normal heart sounds and intact distal pulses.   No murmur heard. Pulmonary/Chest: Effort normal and breath sounds normal. No respiratory distress. She has no wheezes. She has no rales.  Breasts: breasts appear normal, no suspicious masses, no skin or nipple changes or axillary nodes.  Abdominal: Soft. Bowel sounds are normal. She exhibits no distension. There is no tenderness. There is no rebound and no guarding.  Musculoskeletal: She exhibits no edema or deformity.  Lymphadenopathy:    She has no cervical adenopathy.  Neurological: She is alert and oriented to person, place, and time.  Skin: Skin is warm and dry. No rash noted.  Psychiatric: She has a normal mood and affect. Her behavior is normal.  Vitals reviewed.   Activities of Daily Living In your present state of health, do you have any difficulty performing the following activities: 11/18/2016 11/24/2015  Hearing? Y N  Comment - -  Vision? N N  Difficulty concentrating or making decisions? N N  Walking or climbing stairs? N N  Dressing or bathing? N N  Doing errands, shopping? N N  Some recent data might be hidden    Fall Risk Assessment Fall Risk  11/18/2016 09/11/2015 09/09/2014  Falls in the past year? No No No     Depression Screen PHQ 2/9 Scores 11/18/2016 09/11/2015 09/09/2014  PHQ - 2 Score 0 0 0   Audit-C Alcohol Use Screening  Question Answer Points  How often do you have alcoholic drink? never 0  On days you do drink alcohol, how many drinks do you typically consume? 0 0  How oftey will you drink 6 or more in a total? never 0  Total Score:  0   A score of 3 or more in women, and 4 or more in men indicates increased risk for alcohol abuse, EXCEPT if all of the points are from question 1.    Cognitive Testing - 6-CIT  Correct? Score   What year is it? yes 0 0 or 4  What month is it? yes 0 0 or 3  Memorize:    Floyde Parkins,  44,  [address],  [place],      What  time is it? (within 1 hour) yes 0 0 or 3  Count backwards from 20 yes 0 0, 2, or 4  Name the months of the year yes 0 0, 2, or 4  Repeat name & address above yes 0 0, 2, 4, 6, 8, or 10       TOTAL SCORE  0/28   Interpretation:  Normal  Normal (0-7) Abnormal (8-28)       Assessment & Plan:     Annual Wellness Visit  Reviewed patient's Family Medical History Reviewed and updated list of patient's medical providers Assessment of cognitive impairment was done Assessed patient's functional ability Established a written schedule for health screening services Health Risk Assessent Completed and Reviewed  Exercise Activities and Dietary recommendations Goals    None      Immunization History  Administered Date(s) Administered  . Influenza-Unspecified 01/05/2014  . Pneumococcal Conjugate-13 09/06/2013  . Pneumococcal Polysaccharide-23 02/08/2004  . Td 07/12/2003  . Zoster 09/29/2010    Health Maintenance  Topic Date Due  . Janet Berlin  07/11/2013  . INFLUENZA VACCINE  10/30/2016  . DEXA SCAN  Completed  . PNA vac Low Risk Adult  Completed     Discussed health benefits of physical activity, and encouraged her to engage in regular exercise appropriate for her age and condition.   Benign hypertension Well controlled  continue current medications Check CMP and screening lipid panel today  OP (osteoporosis) Patient taking Ca and Vit D She previously did not tolerate Fosamax and does not wish to take any other medications With patient-centered decision making, we decided to not recheck DEXA at this time as it will not change management  Hypokalemia Recheck CMP Patient taking KDur BID  Healthcare maintenance Screening mammogram - could consider stopping screening around age 14 given lack of evidence  No DEXA as above Annual flu vaccine recommended Discussed diet and exercise     ------------------------------------------------------------------------------------------------------------  The entirety of the information documented in the History of Present Illness, Review of Systems and Physical Exam were personally obtained by me. Portions of this information were initially documented by Irving Burton Ratchford, CMA and reviewed by me for thoroughness and accuracy.    Shirlee Latch, MD  [facility]

## 2016-11-18 NOTE — Patient Instructions (Signed)
Preventive Care 65 Years and Older, Female Preventive care refers to lifestyle choices and visits with your health care provider that can promote health and wellness. What does preventive care include?  A yearly physical exam. This is also called an annual well check.  Dental exams once or twice a year.  Routine eye exams. Ask your health care provider how often you should have your eyes checked.  Personal lifestyle choices, including: ? Daily care of your teeth and gums. ? Regular physical activity. ? Eating a healthy diet. ? Avoiding tobacco and drug use. ? Limiting alcohol use. ? Practicing safe sex. ? Taking low-dose aspirin every day. ? Taking vitamin and mineral supplements as recommended by your health care provider. What happens during an annual well check? The services and screenings done by your health care provider during your annual well check will depend on your age, overall health, lifestyle risk factors, and family history of disease. Counseling Your health care provider may ask you questions about your:  Alcohol use.  Tobacco use.  Drug use.  Emotional well-being.  Home and relationship well-being.  Sexual activity.  Eating habits.  History of falls.  Memory and ability to understand (cognition).  Work and work environment.  Reproductive health.  Screening You may have the following tests or measurements:  Height, weight, and BMI.  Blood pressure.  Lipid and cholesterol levels. These may be checked every 5 years, or more frequently if you are over 50 years old.  Skin check.  Lung cancer screening. You may have this screening every year starting at age 55 if you have a 30-pack-year history of smoking and currently smoke or have quit within the past 15 years.  Fecal occult blood test (FOBT) of the stool. You may have this test every year starting at age 50.  Flexible sigmoidoscopy or colonoscopy. You may have a sigmoidoscopy every 5 years or  a colonoscopy every 10 years starting at age 50.  Hepatitis C blood test.  Hepatitis B blood test.  Sexually transmitted disease (STD) testing.  Diabetes screening. This is done by checking your blood sugar (glucose) after you have not eaten for a while (fasting). You may have this done every 1-3 years.  Bone density scan. This is done to screen for osteoporosis. You may have this done starting at age 65.  Mammogram. This may be done every 1-2 years. Talk to your health care provider about how often you should have regular mammograms.  Talk with your health care provider about your test results, treatment options, and if necessary, the need for more tests. Vaccines Your health care provider may recommend certain vaccines, such as:  Influenza vaccine. This is recommended every year.  Tetanus, diphtheria, and acellular pertussis (Tdap, Td) vaccine. You may need a Td booster every 10 years.  Varicella vaccine. You may need this if you have not been vaccinated.  Zoster vaccine. You may need this after age 60.  Measles, mumps, and rubella (MMR) vaccine. You may need at least one dose of MMR if you were born in 1957 or later. You may also need a second dose.  Pneumococcal 13-valent conjugate (PCV13) vaccine. One dose is recommended after age 65.  Pneumococcal polysaccharide (PPSV23) vaccine. One dose is recommended after age 65.  Meningococcal vaccine. You may need this if you have certain conditions.  Hepatitis A vaccine. You may need this if you have certain conditions or if you travel or work in places where you may be exposed to hepatitis   A.  Hepatitis B vaccine. You may need this if you have certain conditions or if you travel or work in places where you may be exposed to hepatitis B.  Haemophilus influenzae type b (Hib) vaccine. You may need this if you have certain conditions.  Talk to your health care provider about which screenings and vaccines you need and how often you  need them. This information is not intended to replace advice given to you by your health care provider. Make sure you discuss any questions you have with your health care provider. Document Released: 04/14/2015 Document Revised: 12/06/2015 Document Reviewed: 01/17/2015 Elsevier Interactive Patient Education  2017 [employer].

## 2016-11-18 NOTE — Assessment & Plan Note (Signed)
Well controlled  continue current medications Check CMP and screening lipid panel today

## 2016-11-19 ENCOUNTER — Telehealth: Payer: Self-pay

## 2016-11-19 LAB — COMPREHENSIVE METABOLIC PANEL
A/G RATIO: 1.8 (ref 1.2–2.2)
ALK PHOS: 87 IU/L (ref 39–117)
ALT: 9 IU/L (ref 0–32)
AST: 23 IU/L (ref 0–40)
Albumin: 4.3 g/dL (ref 3.5–4.7)
BUN/Creatinine Ratio: 16 (ref 12–28)
BUN: 13 mg/dL (ref 8–27)
Bilirubin Total: 0.6 mg/dL (ref 0.0–1.2)
CHLORIDE: 102 mmol/L (ref 96–106)
CO2: 25 mmol/L (ref 20–29)
Calcium: 10.3 mg/dL (ref 8.7–10.3)
Creatinine, Ser: 0.81 mg/dL (ref 0.57–1.00)
GFR calc Af Amer: 78 mL/min/{1.73_m2} (ref >59)
GFR calc non Af Amer: 67 mL/min/{1.73_m2} (ref >59)
GLOBULIN, TOTAL: 2.4 g/dL (ref 1.5–4.5)
Glucose: 100 mg/dL — ABNORMAL HIGH (ref 65–99)
POTASSIUM: 3.9 mmol/L (ref 3.5–5.2)
SODIUM: 142 mmol/L (ref 134–144)
Total Protein: 6.7 g/dL (ref 6.0–8.5)

## 2016-11-19 LAB — LIPID PANEL
CHOL/HDL RATIO: 2.9 ratio (ref 0.0–4.4)
CHOLESTEROL TOTAL: 153 mg/dL (ref 100–199)
HDL: 53 mg/dL (ref >39)
LDL CALC: 90 mg/dL (ref 0–99)
TRIGLYCERIDES: 50 mg/dL (ref 0–149)
VLDL Cholesterol Cal: 10 mg/dL (ref 5–40)

## 2016-11-19 NOTE — Telephone Encounter (Signed)
Pt advised and verbalizes understanding

## 2016-11-19 NOTE — Telephone Encounter (Signed)
-----   Message from Erasmo Downer, MD sent at 11/19/2016  8:24 AM EDT ----- Cholesterol, electrolytes, kidney function, and liver function all normal.  Beryle Flock, Marzella Schlein, MD, MPH [facility] 11/19/2016 8:24 AM

## 2016-12-03 ENCOUNTER — Ambulatory Visit
Admission: RE | Admit: 2016-12-03 | Discharge: 2016-12-03 | Disposition: A | Discharge status: Home / Self Care | Payer: Medicare Other | Source: Ambulatory Visit | Attending: Family Medicine | Admitting: Family Medicine

## 2016-12-03 DIAGNOSIS — Z1231 Encounter for screening mammogram for malignant neoplasm of breast: Secondary | ICD-10-CM | POA: Diagnosis present

## 2016-12-04 ENCOUNTER — Telehealth: Payer: Self-pay

## 2016-12-04 NOTE — Telephone Encounter (Signed)
-----   Message from Erasmo DownerAngela M Bacigalupo, MD sent at 12/04/2016  8:14 AM EDT ----- Normal mammogram  Bacigalupo, Marzella SchleinAngela M, MD, MPH [facility] 12/04/2016 8:14 AM

## 2016-12-04 NOTE — Telephone Encounter (Signed)
Pt advised.

## 2017-02-04 ENCOUNTER — Other Ambulatory Visit: Payer: Self-pay | Admitting: Physician Assistant

## 2017-02-04 DIAGNOSIS — F419 Anxiety disorder, unspecified: Secondary | ICD-10-CM

## 2017-02-04 NOTE — Telephone Encounter (Signed)
Med called in. Pt informed via VM 

## 2017-05-07 NOTE — Telephone Encounter (Signed)
CPE 11/18/2016

## 2017-05-13 ENCOUNTER — Ambulatory Visit (INDEPENDENT_AMBULATORY_CARE_PROVIDER_SITE_OTHER): Payer: Medicare Other | Admitting: Obstetrics and Gynecology

## 2017-05-13 ENCOUNTER — Encounter: Payer: Self-pay | Admitting: Obstetrics and Gynecology

## 2017-05-13 VITALS — BP 166/72 | HR 82 | Ht 64.0 in | Wt 103.3 lb

## 2017-05-13 DIAGNOSIS — L292 Pruritus vulvae: Secondary | ICD-10-CM | POA: Diagnosis not present

## 2017-05-13 DIAGNOSIS — N904 Leukoplakia of vulva: Secondary | ICD-10-CM

## 2017-05-13 NOTE — Patient Instructions (Signed)
1.  Increase Temovate ointment 0.05% topically to the vulva every other day for 4 weeks, then 2.  Begin twice weekly application of Temovate ointment 3.  Return in 3 months for follow-up

## 2017-05-13 NOTE — Progress Notes (Signed)
Chief complaint: 1.  Lichen sclerosis  5373-month follow-up on lichen sclerosus of the vulva and perianal region.  The patient previously was using the Temovate ointment once a week, but increased it over the past several months to twice weekly application because of recurrent vulvar itching.  Irritation is most noted at the introitus as well as along the inner labial fold between the labia minora and labia majora.  Past Medical History:  Diagnosis Date  . Anxiety   . Benign essential tremor   . Complication of anesthesia    nausea  . Gastroesophageal reflux disease   . Hypertension   . Lichen sclerosus et atrophicus of the vulva   . Murmur   . Need for SBE (subacute bacterial endocarditis) prophylaxis   . Osteoporosis, post-menopausal   . Other chest pain   . Paresthesia   . Rhinitis, allergic    Past Surgical History:  Procedure Laterality Date  . ABDOMINAL HYSTERECTOMY  1968  . ASD REPAIR, SECUNDUM  03/2007  . CATARACT EXTRACTION Bilateral 06/2012  . CORRECTION HAMMER TOE Bilateral 2003  . hole in heart repaired     continue to have some leakage  . ORIF PATELLA Left 11/24/2015   Procedure: OPEN REDUCTION INTERNAL (ORIF) FIXATION PATELLA;  Surgeon: Juanell FairlyKevin Krasinski, MD;  Location: [facility] ORS;  Service: Orthopedics;  Laterality: Left;  (PER Dr Rico AlaKransinski Spinal would be ideal     OBJECTIVE: BP (!) 166/72   Pulse 82   Ht 5\' 4"  (1.626 m)   Wt 103 lb 4.8 oz (46.9 kg)   BMI 17.73 kg/m  Pleasant well-appearing female in no acute distress.  Affect is normal.  She is alert and oriented. Pelvic exam: External genitalia-atrophic changes with no epithelial skin breakdown; minimal leukoplakia is noted along the inner labial fold between the labia majora and labia minora.  Cherry hemangiomas are noted on the labia majora. BUS-atrophic changes Vagina-introitus notable for minimal leukoplakia on the left of midline at the introitus; no significant erosion. Rectovaginal-no abnormalities of  the perianal region.  1.  Lichen sclerosis, mild flare  PLAN: 1.  Increase Temovate ointment 0.05% topically to the vulva every other day for 4 weeks 2.  After 4 weeks begin twice weekly application of the Temovate ointments 3.  Return in 3 months for follow-up  A total of 15 minutes were spent face-to-face with the patient during this encounter and over half of that time dealt with counseling and coordination of care.  Herold HarmsMartin A Bowden Boody, MD  Note: This dictation was prepared with Dragon dictation along with smaller phrase technology. Any transcriptional errors that result from this process are unintentional.

## 2017-05-16 ENCOUNTER — Telehealth: Payer: Self-pay

## 2017-05-16 NOTE — Telephone Encounter (Signed)
Received fax from insurance company that states her Irbesartan was recalled. Per Dr. Leonard SchwartzB, need to ask pt if she will be ok taking losartan/hctz instead.

## 2017-05-17 ENCOUNTER — Other Ambulatory Visit: Payer: Self-pay | Admitting: Obstetrics and Gynecology

## 2017-05-19 ENCOUNTER — Ambulatory Visit: Payer: Medicare Other | Admitting: Family Medicine

## 2017-05-19 ENCOUNTER — Encounter: Payer: Self-pay | Admitting: Family Medicine

## 2017-05-19 VITALS — BP 140/66 | HR 78 | Temp 97.9°F | Resp 16 | Wt 104.8 lb

## 2017-05-19 DIAGNOSIS — F419 Anxiety disorder, unspecified: Secondary | ICD-10-CM | POA: Diagnosis not present

## 2017-05-19 DIAGNOSIS — I10 Essential (primary) hypertension: Secondary | ICD-10-CM

## 2017-05-19 DIAGNOSIS — E876 Hypokalemia: Secondary | ICD-10-CM | POA: Diagnosis not present

## 2017-05-19 MED ORDER — LOSARTAN POTASSIUM-HCTZ 100-25 MG PO TABS: 1.0000 | ORAL_TABLET | Freq: Every day | ORAL | 3 refills | Status: DC

## 2017-05-19 MED ORDER — POTASSIUM CHLORIDE CRYS ER 20 MEQ PO TBCR: EXTENDED_RELEASE_TABLET | ORAL | 3 refills | Status: DC

## 2017-05-19 NOTE — Telephone Encounter (Signed)
Pt coming in for OV today. Will discuss at that time.

## 2017-05-19 NOTE — Progress Notes (Signed)
Patient: Susan Booth Female    DOB: 11/21/1930   84 y.o.   MRN: [id number] Visit Date: 05/19/2017  Today's Provider: Shirlee Latch, MD   I, Joslyn Hy, CMA, am acting as scribe for Shirlee Latch, MD.  Chief Complaint  Patient presents with  . Hypertension   Subjective:    HPI      Hypertension, follow-up:  BP Readings from Last 3 Encounters:  05/19/17 140/66  05/13/17 (!) 166/72  11/18/16 140/66    She was last seen for hypertension 6 months ago.  BP at that visit was 140/66. Management since that visit includes 140/66. She reports good compliance with treatment. Received fax from [employer] stating pt's Jonette Eva was recalled. She is not exercising. She is adherent to low salt diet.   Outside blood pressures are being checked, but pt is unsure of the accuracy of the machine. She states her BPs read 150's/90's. She is experiencing dizziness after laying down; only lasts a couple of minutes. States this is intermittent, and has not occurred in about 2 months..  Patient denies chest pain, chest pressure/discomfort, claudication, dyspnea, exertional chest pressure/discomfort, fatigue, irregular heart beat, lower extremity edema, near-syncope, orthopnea, palpitations and syncope.   Cardiovascular risk factors include advanced age (older than 75 for men, 36 for women), family history of premature cardiovascular disease and hypertension.    Weight trend: stable Wt Readings from Last 3 Encounters:  05/19/17 104 lb 12.8 oz (47.5 kg)  05/13/17 103 lb 4.8 oz (46.9 kg)  11/18/16 102 lb (46.3 kg)    Current diet: in general, a "healthy" diet    ------------------------------------------------------------------------ Anxiety Son recently diagnosed with prostate cancer Husband recently fell and broke his wrist and then was in an MVC Tries to avoid taking lorazepam if possible Takes Lorazepam <1/wk Feels she is as well controlled as she is  going to be  No Known Allergies   Current Outpatient Medications:  .  aspirin EC 81 MG tablet, Take 81 mg by mouth daily., Disp: , Rfl:  .  Calcium Carbonate-Vitamin D 600-400 MG-UNIT per tablet, CALTRATE 600+D, 600-400MG -UNIT (Oral Tablet)  1 Every Day for 0 days  Quantity: 0.00;  Refills: 0   Ordered :12-Jan-2010  [name] ;  Started 15-Aug-2008 Active Comments: DX: 733.90, Disp: , Rfl:  .  clobetasol cream (TEMOVATE) 0.05 %, APPLY ONE APPLICATION TOPICALLY TWO TIMES A WEEK, Disp: 30 g, Rfl: 1 .  fluticasone (FLONASE) 50 MCG/ACT nasal spray, Place 1 spray into the nose daily as needed. , Disp: , Rfl:  .  LORazepam (ATIVAN) 0.5 MG tablet, TAKE 1 TABLET BY MOUTH TWICE DAILY AND THEN 1 EVERY 6 HOURS AS NEEDED, Disp: 120 tablet, Rfl: 5 .  potassium chloride SA (K-DUR,KLOR-CON) 20 MEQ tablet, TAKE ONE (1) TABLET BY MOUTH TWO (2) TIMES DAILY, Disp: 180 tablet, Rfl: 3 .  amoxicillin (AMOXIL) 500 MG tablet, Take 500 mg by mouth. Take 1 tablet one hour prior to dental work., Disp: , Rfl:  .  losartan-hydrochlorothiazide (HYZAAR) 100-25 MG tablet, Take 1 tablet by mouth daily., Disp: 90 tablet, Rfl: 3  Review of Systems  Constitutional: Negative for activity change, appetite change, chills, diaphoresis, fatigue, fever and unexpected weight change.  HENT: Positive for voice change ("hoarseness").   Respiratory: Negative for shortness of breath.   Cardiovascular: Negative for chest pain, palpitations and leg swelling.  Neurological: Positive for dizziness.    Social History   Tobacco Use  . Smoking status: Never Smoker  .  Smokeless tobacco: Never Used  Substance Use Topics  . Alcohol use: No   Objective:   BP 140/66 (BP Location: Left Arm, Patient Position: Sitting, Cuff Size: Normal)   Pulse 78   Temp 97.9 F (36.6 C) (Oral)   Resp 16   Wt 104 lb 12.8 oz (47.5 kg)   SpO2 99%   BMI 17.99 kg/m  Vitals:   05/19/17 1327  BP: 140/66  Pulse: 78  Resp: 16  Temp: 97.9 F (36.6 C)    TempSrc: Oral  SpO2: 99%  Weight: 104 lb 12.8 oz (47.5 kg)     Physical Exam  Constitutional: She is oriented to person, place, and time. She appears well-developed and well-nourished. No distress.  HENT:  Head: Normocephalic and atraumatic.  Eyes: Conjunctivae are normal. No scleral icterus.  Neck: Neck supple.  Cardiovascular: Normal rate, regular rhythm, normal heart sounds and intact distal pulses.  No murmur heard. Pulmonary/Chest: Effort normal and breath sounds normal. No respiratory distress. She has no wheezes. She has no rales.  Musculoskeletal: She exhibits no edema.  Lymphadenopathy:    She has no cervical adenopathy.  Neurological: She is alert and oriented to person, place, and time.  Skin: Skin is warm and dry. No rash noted.  Psychiatric: She has a normal mood and affect. Her behavior is normal.  Vitals reviewed.      Assessment & Plan:      Problem List Items Addressed This Visit      Cardiovascular and Mediastinum   Benign hypertension - Primary    Borderline controlled Will d/c irbesartan due to recall Will start equivalent dose of losartan 100mg  daily Will increase HCTZ to 25mg  daily for better control Did not tolerate amlodipine or propranolol in the past Patient to call in 2-4 weeks with BP readings Recent Cr wnl Recheck BMP F/u in 6 months      Relevant Medications   losartan-hydrochlorothiazide (HYZAAR) 100-25 MG tablet   Other Relevant Orders   Basic Metabolic Panel (BMET)     Other   Anxiety    Fairly well controlled Current stressors are driving it Continue very limited benzo use Patient understands risks of continued benzo use in the elderly      Hypokalemia    Recheck CMP Continue KDur      Relevant Medications   potassium chloride SA (K-DUR,KLOR-CON) 20 MEQ tablet   Other Relevant Orders   Basic Metabolic Panel (BMET)       Return in about 6 months (around 11/16/2017) for CPE/AWV (different days).   The entirety of  the information documented in the History of Present Illness, Review of Systems and Physical Exam were personally obtained by me. Portions of this information were initially documented by Irving BurtonEmily Ratchford, CMA and reviewed by me for thoroughness and accuracy.    Erasmo DownerBacigalupo, Angela M, MD, MPH [facility] 05/19/2017 3:59 PM

## 2017-05-19 NOTE — Assessment & Plan Note (Signed)
Recheck CMP Continue KDur

## 2017-05-19 NOTE — Patient Instructions (Signed)

## 2017-05-19 NOTE — Assessment & Plan Note (Addendum)
Fairly well controlled Current stressors are driving it Continue very limited benzo use Patient understands risks of continued benzo use in the elderly

## 2017-05-19 NOTE — Assessment & Plan Note (Signed)
Borderline controlled Will d/c irbesartan due to recall Will start equivalent dose of losartan 100mg  daily Will increase HCTZ to 25mg  daily for better control Did not tolerate amlodipine or propranolol in the past Patient to call in 2-4 weeks with BP readings Recent Cr wnl Recheck BMP F/u in 6 months

## 2017-05-20 ENCOUNTER — Telehealth: Payer: Self-pay

## 2017-05-20 ENCOUNTER — Telehealth: Payer: Self-pay | Admitting: Family Medicine

## 2017-05-20 DIAGNOSIS — F419 Anxiety disorder, unspecified: Secondary | ICD-10-CM

## 2017-05-20 DIAGNOSIS — E876 Hypokalemia: Secondary | ICD-10-CM

## 2017-05-20 LAB — BASIC METABOLIC PANEL
BUN/Creatinine Ratio: 19 (ref 12–28)
BUN: 16 mg/dL (ref 8–27)
CO2: 25 mmol/L (ref 20–29)
CREATININE: 0.86 mg/dL (ref 0.57–1.00)
Calcium: 10 mg/dL (ref 8.7–10.3)
Chloride: 102 mmol/L (ref 96–106)
GFR calc Af Amer: 72 mL/min/{1.73_m2} (ref >59)
GFR, EST NON AFRICAN AMERICAN: 62 mL/min/{1.73_m2} (ref >59)
Glucose: 137 mg/dL — ABNORMAL HIGH (ref 65–99)
POTASSIUM: 3.9 mmol/L (ref 3.5–5.2)
SODIUM: 141 mmol/L (ref 134–144)

## 2017-05-20 MED ORDER — FLUTICASONE PROPIONATE 50 MCG/ACT NA SUSP: 1.0000 | Freq: Every day | NASAL | 3 refills | Status: DC | PRN

## 2017-05-20 MED ORDER — LOSARTAN POTASSIUM-HCTZ 100-25 MG PO TABS: 1.0000 | ORAL_TABLET | Freq: Every day | ORAL | 3 refills | Status: DC

## 2017-05-20 MED ORDER — POTASSIUM CHLORIDE CRYS ER 20 MEQ PO TBCR: EXTENDED_RELEASE_TABLET | ORAL | 3 refills | Status: DC

## 2017-05-20 NOTE — Telephone Encounter (Signed)
-----   Message from Erasmo DownerAngela M Bacigalupo, MD sent at 05/20/2017  8:04 AM EST ----- Normal kidney function and electrolytes  Bacigalupo, Marzella SchleinAngela M, MD, MPH [facility] 05/20/2017 8:04 AM

## 2017-05-20 NOTE — Telephone Encounter (Signed)
[facility] with [facility] [place] [facility].  She is not happy with the customer service.    [facility] Losartin-HCTZ is on back order and they have no idea when they will get it in and [facility] told [facility] that [facility] do not have an RX for Potassium.    So patient wants to transfer all of her meds to [facility] in [place].     Potassium Chloride 20 meq. Losartin-HCTZ 100-25 mg. Ativan 0.5 mg.  Flonase   Please let patient know when these meds are called in.

## 2017-05-20 NOTE — Telephone Encounter (Signed)
Pt advised.

## 2017-05-20 NOTE — Telephone Encounter (Signed)
Sent in new prescriptions, except for Lorazepam. Asked pt to [facility] this med [facility], and to call back if she experiences any problems.

## 2017-05-20 NOTE — Telephone Encounter (Signed)
Yes. Ok to transfer to another pharmacy.  Should [facility] and not create new Rx.  Erasmo DownerBacigalupo, Matalie Romberger M, MD, MPH [facility] 05/20/2017 11:49 AM

## 2017-05-20 NOTE — Telephone Encounter (Signed)
Ok to fill at different pharmacy?

## 2017-05-26 ENCOUNTER — Telehealth: Payer: Self-pay | Admitting: Family Medicine

## 2017-05-26 NOTE — Telephone Encounter (Signed)
Pt advised and agrees with plan. 

## 2017-05-26 NOTE — Telephone Encounter (Signed)
Pt states her BP medication was recently increased to Losartan-HCTZ 100-25 mg. States this is causing her to feel weak, jittery, imbalanced, nervous and causes dizziness. Denies syncope, chest pain, SOB. BP at home has been running around 150-155/86-88. She has appointment with Dr. Lady GaryFath on 05/28/2017. Is wondering if he should make changes vs PCP. [facility] [place]. If you agree to make med changes, pt requesting #30 day rx in case she not tolerate the new medication.

## 2017-05-26 NOTE — Telephone Encounter (Signed)
Given that BP, patient does need this higher dose of medicaiton. These are not common side effects from this medicine.  Would recommend staying on it a little longer and see if it gets better.  She can also discuss with Dr Lady GaryFath this week.  Would also recommend checking standing BP to make sure it is not too low which could lead to weakness and dizziness.  Erasmo DownerBacigalupo, Angela M, MD, MPH [facility] 05/26/2017 2:05 PM

## 2017-05-26 NOTE — Telephone Encounter (Signed)
Patient called and states that the medicine Dr. B gave her, she cannot take, it got her nervous and nausea and wants to talk to nurse   CB# [phone]

## 2017-08-04 ENCOUNTER — Telehealth: Payer: Self-pay | Admitting: Family Medicine

## 2017-08-04 NOTE — Telephone Encounter (Signed)
Pt called to say she is now using [facility].  She use to use [facility].  [facility] said [facility] and told [facility] that her Dr. Had called in her blood pressure med. But [facility] not [facility]..  Please send to [place],  teri

## 2017-08-04 NOTE — Telephone Encounter (Signed)
Pharmacy has been updated.

## 2017-08-12 ENCOUNTER — Encounter: Payer: Medicare Other | Admitting: Obstetrics and Gynecology

## 2017-08-13 ENCOUNTER — Ambulatory Visit: Payer: Medicare Other | Admitting: Obstetrics and Gynecology

## 2017-08-13 ENCOUNTER — Encounter: Payer: Self-pay | Admitting: Obstetrics and Gynecology

## 2017-08-13 VITALS — BP 158/65 | HR 81 | Ht 60.0 in | Wt 103.9 lb

## 2017-08-13 DIAGNOSIS — L900 Lichen sclerosus et atrophicus: Secondary | ICD-10-CM | POA: Diagnosis not present

## 2017-08-13 NOTE — Patient Instructions (Signed)
1.  Increase Temovate ointment 0.05% topically to the bottom vulva and perianal region from twice weekly application to every other day application. 2.  Return in 3 months for follow-up 3.  If symptoms persist despite every other day application, please contact our office so we may increase the application regimen.

## 2017-08-13 NOTE — Progress Notes (Signed)
Chief complaint: 1.  Lichen sclerosis 2.  Perianal itching  Susan Booth presents for 50-month follow-up.  She has been on a decreasing dosing regimen of Temovate ointment 0.05%.  She had been using it every other day for 4 weeks followed by twice weekly application over the past several months.  She states that she is intermittently symptomatic and on occasion has taken an extra dose of the ointment to help control symptoms.  The itching is primarily in the perianal region and occasionally at the introitus. Okey Regal reports no significant UTI symptoms.  Bowel function is normal.  Past medical history, past surgical history, problem list, medications, and allergies are reviewed  OBJECTIVE: BP (!) 158/65   Pulse 81   Ht 5' (1.524 m)   Wt 103 lb 14.4 oz (47.1 kg)   BMI 20.29 kg/m  Pleasant elderly female in no acute distress.  Alert and oriented. Abdomen: Soft, nontender without organomegaly Pelvic exam:  External genitalia-atrophic changes with no epithelial skin breakdown; there is agglutination of the labia minora to the labia majora; there is no significant leukoplakia in this region.Valentino Saxon hemangiomas are noted on the labia majora. BUS-atrophic changes Vagina-introitus notable for minimal leukoplakia on the left of midline at the introitus; minimal erosion is noted just to the left of introitus approximately 5 mm in diameter Rectovaginal-minimal inflammatory changes noted in the perianal region without obvious leukoplakia.  ASSESSMENT: 1.  Lichen sclerosis with flare in symptoms, not controlled with twice weekly application of Temovate ointment 2.  Predominant source of itching is perianal region and posterior fourchette  PLAN: 1.  Increase Temovate ointment 0.05% topically to the most symptomatic areas every other day for the next 3 months. 2.  Return in 3 months for follow-up 3.  If symptoms are not controlled with every other day application, patient is to contact the office for  further management recommendations.  A total of 15 minutes were spent face-to-face with the patient during this encounter and over half of that time dealt with counseling and coordination of care.  Herold Harms, MD  Note: This dictation was prepared with Dragon dictation along with smaller phrase technology. Any transcriptional errors that result from this process are unintentional.

## 2017-11-03 ENCOUNTER — Ambulatory Visit (INDEPENDENT_AMBULATORY_CARE_PROVIDER_SITE_OTHER): Payer: Medicare Other

## 2017-11-03 VITALS — BP 152/64 | HR 75 | Temp 98.7°F | Ht 60.0 in | Wt 104.4 lb

## 2017-11-03 DIAGNOSIS — Z0000 Encounter for general adult medical examination without abnormal findings: Secondary | ICD-10-CM | POA: Diagnosis not present

## 2017-11-03 NOTE — Patient Instructions (Addendum)
[name] , Thank you for taking time to come for your Medicare Wellness Visit. I appreciate your ongoing commitment to your health goals. Please review the following plan we discussed and let me know if I can assist you in the future.   Screening recommendations/referrals: Colonoscopy: N/A  Mammogram: Up to date Bone Density: Up to date Recommended yearly ophthalmology/optometry visit for glaucoma screening and checkup Recommended yearly dental visit for hygiene and checkup  Vaccinations: Influenza vaccine: Up to date Pneumococcal vaccine: Up to date Tdap vaccine: Pt declines today.  Shingles vaccine: Pt declines today.     Advanced directives: Advance directive discussed with you today. Even though you declined this today please call our office should you change your mind and we can give you the proper paperwork for you to fill out.  Conditions/risks identified: Recommend increasing water intake to 4-6 glasses a day.   Next appointment: 11/24/17 @ 9 AM with Dr Susan Booth.   Preventive Care 10165 Years and Older, Female Preventive care refers to lifestyle choices and visits with your health care provider that can promote health and wellness. What does preventive care include?  A yearly physical exam. This is also called an annual well check.  Dental exams once or twice a year.  Routine eye exams. Ask your health care provider how often you should have your eyes checked.  Personal lifestyle choices, including:  Daily care of your teeth and gums.  Regular physical activity.  Eating a healthy diet.  Avoiding tobacco and drug use.  Limiting alcohol use.  Practicing safe sex.  Taking low-dose aspirin every day.  Taking vitamin and mineral supplements as recommended by your health care provider. What happens during an annual well check? The services and screenings done by your health care provider during your annual well check will depend on your age, overall health,  lifestyle risk factors, and family history of disease. Counseling  Your health care provider may ask you questions about your:  Alcohol use.  Tobacco use.  Drug use.  Emotional well-being.  Home and relationship well-being.  Sexual activity.  Eating habits.  History of falls.  Memory and ability to understand (cognition).  Work and work Astronomerenvironment.  Reproductive health. Screening  You may have the following tests or measurements:  Height, weight, and BMI.  Blood pressure.  Lipid and cholesterol levels. These may be checked every 5 years, or more frequently if you are over 82 years old.  Skin check.  Lung cancer screening. You may have this screening every year starting at age 82 if you have a 30-pack-year history of smoking and currently smoke or have quit within the past 15 years.  Fecal occult blood test (FOBT) of the stool. You may have this test every year starting at age 82.  Flexible sigmoidoscopy or colonoscopy. You may have a sigmoidoscopy every 5 years or a colonoscopy every 10 years starting at age 82.  Hepatitis C blood test.  Hepatitis B blood test.  Sexually transmitted disease (STD) testing.  Diabetes screening. This is done by checking your blood sugar (glucose) after you have not eaten for a while (fasting). You may have this done every 1-3 years.  Bone density scan. This is done to screen for osteoporosis. You may have this done starting at age 82.  Mammogram. This may be done every 1-2 years. Talk to your health care provider about how often you should have regular mammograms. Talk with your health care provider about your test results, treatment options,  and if necessary, the need for more tests. Vaccines  Your health care provider may recommend certain vaccines, such as:  Influenza vaccine. This is recommended every year.  Tetanus, diphtheria, and acellular pertussis (Tdap, Td) vaccine. You may need a Td booster every 10 years.  Zoster  vaccine. You may need this after age 46.  Pneumococcal 13-valent conjugate (PCV13) vaccine. One dose is recommended after age 79.  Pneumococcal polysaccharide (PPSV23) vaccine. One dose is recommended after age 4. Talk to your health care provider about which screenings and vaccines you need and how often you need them. This information is not intended to replace advice given to you by your health care provider. Make sure you discuss any questions you have with your health care provider. Document Released: 04/14/2015 Document Revised: 12/06/2015 Document Reviewed: 01/17/2015 Elsevier Interactive Patient Education  2017 [place] Prevention in the Home Falls can cause injuries. They can happen to people of all ages. There are many things you can do to make your home safe and to help prevent falls. What can I do on the outside of my home?  Regularly fix the edges of walkways and driveways and fix any cracks.  Remove anything that might make you trip as you walk through a door, such as a raised step or threshold.  Trim any bushes or trees on the path to your home.  Use bright outdoor lighting.  Clear any walking paths of anything that might make someone trip, such as rocks or tools.  Regularly check to see if handrails are loose or broken. Make sure that both sides of any steps have handrails.  Any raised decks and porches should have guardrails on the edges.  Have any leaves, snow, or ice cleared regularly.  Use sand or salt on walking paths during winter.  Clean up any spills in your garage right away. This includes oil or grease spills. What can I do in the bathroom?  Use night lights.  Install grab bars by the toilet and in the tub and shower. Do not use towel bars as grab bars.  Use non-skid mats or decals in the tub or shower.  If you need to sit down in the shower, use a plastic, non-slip stool.  Keep the floor dry. Clean up any water that spills on the  floor as soon as it happens.  Remove soap buildup in the tub or shower regularly.  Attach bath mats securely with double-sided non-slip rug tape.  Do not have throw rugs and other things on the floor that can make you trip. What can I do in the bedroom?  Use night lights.  Make sure that you have a light by your bed that is easy to reach.  Do not use any sheets or blankets that are too big for your bed. They should not hang down onto the floor.  Have a firm chair that has side arms. You can use this for support while you get dressed.  Do not have throw rugs and other things on the floor that can make you trip. What can I do in the kitchen?  Clean up any spills right away.  Avoid walking on wet floors.  Keep items that you use a lot in easy-to-reach places.  If you need to reach something above you, use a strong step stool that has a grab bar.  Keep electrical cords out of the way.  Do not use floor polish or wax that makes floors slippery. If  you must use wax, use non-skid floor wax.  Do not have throw rugs and other things on the floor that can make you trip. What can I do with my stairs?  Do not leave any items on the stairs.  Make sure that there are handrails on both sides of the stairs and use them. Fix handrails that are broken or loose. Make sure that handrails are as long as the stairways.  Check any carpeting to make sure that it is firmly attached to the stairs. Fix any carpet that is loose or worn.  Avoid having throw rugs at the top or bottom of the stairs. If you do have throw rugs, attach them to the floor with carpet tape.  Make sure that you have a light switch at the top of the stairs and the bottom of the stairs. If you do not have them, ask someone to add them for you. What else can I do to help prevent falls?  Wear shoes that:  Do not have high heels.  Have rubber bottoms.  Are comfortable and fit you well.  Are closed at the toe. Do not wear  sandals.  If you use a stepladder:  Make sure that it is fully opened. Do not climb a closed stepladder.  Make sure that both sides of the stepladder are locked into place.  Ask someone to hold it for you, if possible.  Clearly mark and make sure that you can see:  Any grab bars or handrails.  First and last steps.  Where the edge of each step is.  Use tools that help you move around (mobility aids) if they are needed. These include:  Canes.  Walkers.  Scooters.  Crutches.  Turn on the lights when you go into a dark area. Replace any light bulbs as soon as they burn out.  Set up your furniture so you have a clear path. Avoid moving your furniture around.  If any of your floors are uneven, fix them.  If there are any pets around you, be aware of where they are.  Review your medicines with your doctor. Some medicines can make you feel dizzy. This can increase your chance of falling. Ask your doctor what other things that you can do to help prevent falls. This information is not intended to replace advice given to you by your health care provider. Make sure you discuss any questions you have with your health care provider. Document Released: 01/12/2009 Document Revised: 08/24/2015 Document Reviewed: 04/22/2014 Elsevier Interactive Patient Education  2017 [employer].

## 2017-11-03 NOTE — Progress Notes (Signed)
Subjective:   Susan Booth is a 84 y.o. female who presents for Medicare Annual (Subsequent) preventive examination.  Review of Systems:  N/A  Cardiac Risk Factors include: advanced age (>69men, >25 women);hypertension     Objective:     Vitals: BP (!) 152/64 (BP Location: Right Arm)   Pulse 75   Temp 98.7 F (37.1 C) (Oral)   Ht 5' (1.524 m)   Wt 104 lb 6.4 oz (47.4 kg)   BMI 20.39 kg/m   Body mass index is 20.39 kg/m.  Advanced Directives 11/03/2017 11/18/2016 08/16/2016 11/24/2015 11/22/2015 11/16/2015 09/11/2015  Does Patient Have a Medical Advance Directive? No No No No No No No  Would patient like information on creating a medical advance directive? No - Patient declined - - No - patient declined information - No - patient declined information -    Tobacco Social History   Tobacco Use  Smoking Status Never Smoker  Smokeless Tobacco Never Used     Counseling given: Not Answered   Clinical Intake:  Pre-visit preparation completed: Yes  Pain : No/denies pain Pain Score: 0-No pain     Nutritional Status: BMI of 19-24  Normal Nutritional Risks: None Diabetes: No  How often do you need to have someone help you when you read instructions, pamphlets, or other written materials from your doctor or pharmacy?: 1 - Never  Interpreter Needed?: No  Information entered by :: [facility], LPN  Past Medical History:  Diagnosis Date  . Anxiety   . Benign essential tremor   . Complication of anesthesia    nausea  . Gastroesophageal reflux disease   . Hypertension   . Lichen sclerosus et atrophicus of the vulva   . Murmur   . Need for SBE (subacute bacterial endocarditis) prophylaxis   . Osteoporosis, post-menopausal   . Other chest pain   . Paresthesia   . Rhinitis, allergic    Past Surgical History:  Procedure Laterality Date  . ABDOMINAL HYSTERECTOMY  1968  . ASD REPAIR, SECUNDUM  03/2007  . CATARACT EXTRACTION Bilateral 06/2012  . CORRECTION HAMMER  TOE Bilateral 2003  . hole in heart repaired     continue to have some leakage  . ORIF PATELLA Left 11/24/2015   Procedure: OPEN REDUCTION INTERNAL (ORIF) FIXATION PATELLA;  Surgeon: Juanell Fairly, MD;  Location: [facility] ORS;  Service: Orthopedics;  Laterality: Left;  (PER Dr Rico Ala Spinal would be ideal    Family History  Problem Relation Age of Onset  . COPD Mother   . Early death Mother   . Lung cancer Mother   . Heart disease Father   . Heart attack Father   . Heart disease Sister        heart valve problem  . Breast cancer Maternal Aunt 70  . Prostate cancer Son   . Prostate cancer Son    Social History   Socioeconomic History  . Marital status: Married    Spouse name: Sherrine Maples  . Number of children: 2  . Years of education: Some College  . Highest education level: Some college, no degree  Occupational History  . Occupation: Retired  Engineer, production  . Financial resource strain: Not hard at all  . Food insecurity:    Worry: Never true    Inability: Never true  . Transportation needs:    Medical: No    Non-medical: No  Tobacco Use  . Smoking status: Never Smoker  . Smokeless tobacco: Never Used  Substance and Sexual  Activity  . Alcohol use: No  . Drug use: No  . Sexual activity: Never  Lifestyle  . Physical activity:    Days per week: Not on file    Minutes per session: Not on file  . Stress: Only a little  Relationships  . Social connections:    Talks on phone: Not on file    Gets together: Not on file    Attends religious service: Not on file    Active member of club or organization: Not on file    Attends meetings of clubs or organizations: Not on file    Relationship status: Not on file  Other Topics Concern  . Not on file  Social History Narrative  . Not on file    Outpatient Encounter Medications as of 11/03/2017  Medication Sig  . amoxicillin (AMOXIL) 500 MG tablet Take 500 mg by mouth. Take 1 tablet one hour prior to dental work.  [name]. aspirin EC  81 MG tablet Take 81 mg by mouth daily.  . Calcium Carbonate-Vitamin D 600-400 MG-UNIT per tablet CALTRATE 600+D, 600-400MG -UNIT (Oral Tablet)  1 Every Day for 0 days  Quantity: 0.00;  Refills: 0   Ordered :12-Jan-2010  Merleen NicelyHaith, Tammy ;  Started 15-Aug-2008 Active Comments: DX: 733.90  . clobetasol cream (TEMOVATE) 0.05 % APPLY ONE APPLICATION TOPICALLY TWO TIMES A WEEK  . fluticasone (FLONASE) 50 MCG/ACT nasal spray Place 1 spray into both nostrils daily as needed.  [name]. LORazepam (ATIVAN) 0.5 MG tablet TAKE 1 TABLET BY MOUTH TWICE DAILY AND THEN 1 EVERY 6 HOURS AS NEEDED  . losartan-hydrochlorothiazide (HYZAAR) 100-25 MG tablet Take 1 tablet by mouth daily.  . potassium chloride SA (K-DUR,KLOR-CON) 20 MEQ tablet TAKE ONE (1) TABLET BY MOUTH TWO (2) TIMES DAILY   No facility-administered encounter medications on file as of 11/03/2017.     Activities of Daily Living In your present state of health, do you have any difficulty performing the following activities: 11/03/2017 11/18/2016  Hearing? Malvin JohnsY Y  Comment Wears bilateral hearing aids. -  Vision? N N  Difficulty concentrating or making decisions? N N  Walking or climbing stairs? N N  Dressing or bathing? N N  Doing errands, shopping? N N  Preparing Food and eating ? N -  Using the Toilet? N -  In the past six months, have you accidently leaked urine? N -  Do you have problems with loss of bowel control? N -  Managing your Medications? N -  Managing your Finances? N -  Housekeeping or managing your Housekeeping? N -  Some recent data might be hidden    Patient Care Team: Erasmo DownerBacigalupo, Angela M, MD as PCP - General (Family Medicine) Defrancesco, Prentice DockerMartin A, MD as Consulting Physician (Obstetrics and Gynecology) Sallee Langeingeldein, Steven, MD as Consulting Physician (Ophthalmology) Lady GaryFath, Darlin PriestlyKenneth A, MD as Consulting Physician (Cardiology)    Assessment:   This is a routine wellness examination for [place].  Exercise Activities and Dietary  recommendations Current Exercise Habits: Home exercise routine, Type of exercise: walking, Time (Minutes): 15, Frequency (Times/Week): 7, Weekly Exercise (Minutes/Week): 105, Intensity: Mild, Exercise limited by: None identified  Goals    . DIET - INCREASE WATER INTAKE     Recommend increasing water intake to 4-6 glasses a day.        Fall Risk Fall Risk  11/03/2017 11/18/2016 09/11/2015 09/09/2014  Falls in the past year? Yes No No No  Number falls in past yr: 1 - - -  Injury with Fall?  Yes - - -  Comment sprained ankle - - -  Follow up Falls prevention discussed - - -   Is the patient's home free of loose throw rugs in walkways, pet beds, electrical cords, etc?   yes      Grab bars in the bathroom? no      Handrails on the stairs?   yes      Adequate lighting?   yes  Timed Get Up and Go performed: N/A  Depression Screen PHQ 2/9 Scores 11/03/2017 11/18/2016 09/11/2015 09/09/2014  PHQ - 2 Score 0 0 0 0     Cognitive Function     6CIT Screen 11/03/2017  What Year? 0 points  What month? 0 points  What time? 0 points  Count back from 20 0 points  Months in reverse 0 points  Repeat phrase 2 points  Total Score 2    Immunization History  Administered Date(s) Administered  . Influenza, High Dose Seasonal PF 01/05/2014  . Influenza-Unspecified 12/29/2016  . Pneumococcal Conjugate-13 09/06/2013  . Pneumococcal Polysaccharide-23 02/08/2004  . Td 07/12/2003  . Zoster 09/29/2010    Qualifies for Shingles Vaccine? Due for Shingles vaccine. Declined my offer to administer today. Education has been provided regarding the importance of this vaccine. Pt has been advised to call her insurance company to determine her out of pocket expense. Advised she may also receive this vaccine at her local pharmacy or Health Dept. Verbalized acceptance and understanding.  Screening Tests Health Maintenance  Topic Date Due  . TETANUS/TDAP  07/11/2013  . INFLUENZA VACCINE  10/30/2017  . DEXA SCAN   Completed  . PNA vac Low Risk Adult  Completed    Cancer Screenings: Lung: Low Dose CT Chest recommended if Age 106-80 years, 30 pack-year currently smoking OR have quit w/in 15years. Patient does not qualify. Breast:  Up to date on Mammogram? Yes   Up to date of Bone Density/Dexa? Yes Colorectal: N/A  Additional Screenings:  Hepatitis C Screening: N/A     Plan:  I have personally reviewed and addressed the Medicare Annual Wellness questionnaire and have noted the following in the patient's chart:  A. Medical and social history B. Use of alcohol, tobacco or illicit drugs  C. Current medications and supplements D. Functional ability and status E.  Nutritional status F.  Physical activity G. Advance directives H. List of other physicians I.  Hospitalizations, surgeries, and ER visits in previous 12 months J.  Vitals K. Screenings such as hearing and vision if needed, cognitive and depression L. Referrals and appointments - none  In addition, I have reviewed and discussed with patient certain preventive protocols, quality metrics, and best practice recommendations. A written personalized care plan for preventive services as well as general preventive health recommendations were provided to patient.  See attached scanned questionnaire for additional information.   Signed,  Hyacinth Meeker, LPN Nurse Health Advisor   Nurse Recommendations: Pt declined the tetanus vaccine today. Advised pt to monitor BP readings daily and advise PCP of readings at next OV.

## 2017-11-06 IMAGING — CR DG ANKLE 2V *R*
1 series · 2 of 2 positions shown · non-contrast
Comparison: None.

CLINICAL DATA: Pain

EXAM:
RIGHT ANKLE - 2 VIEW

[Series 1: x ankle ap right · 0.14mm/px · 2 of 2 slices shown]
[im 1/2]
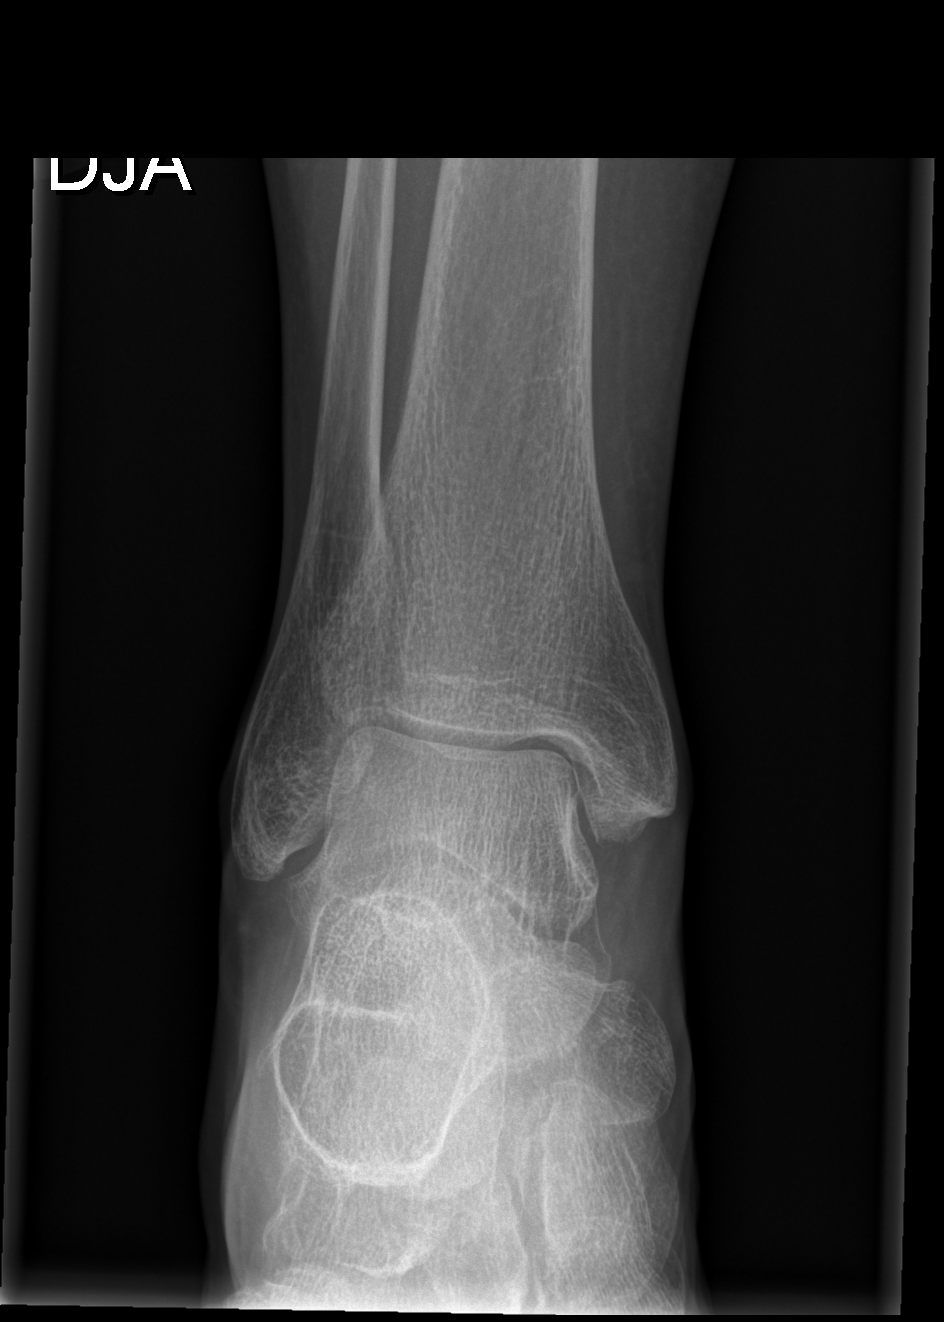
[im 2/2]
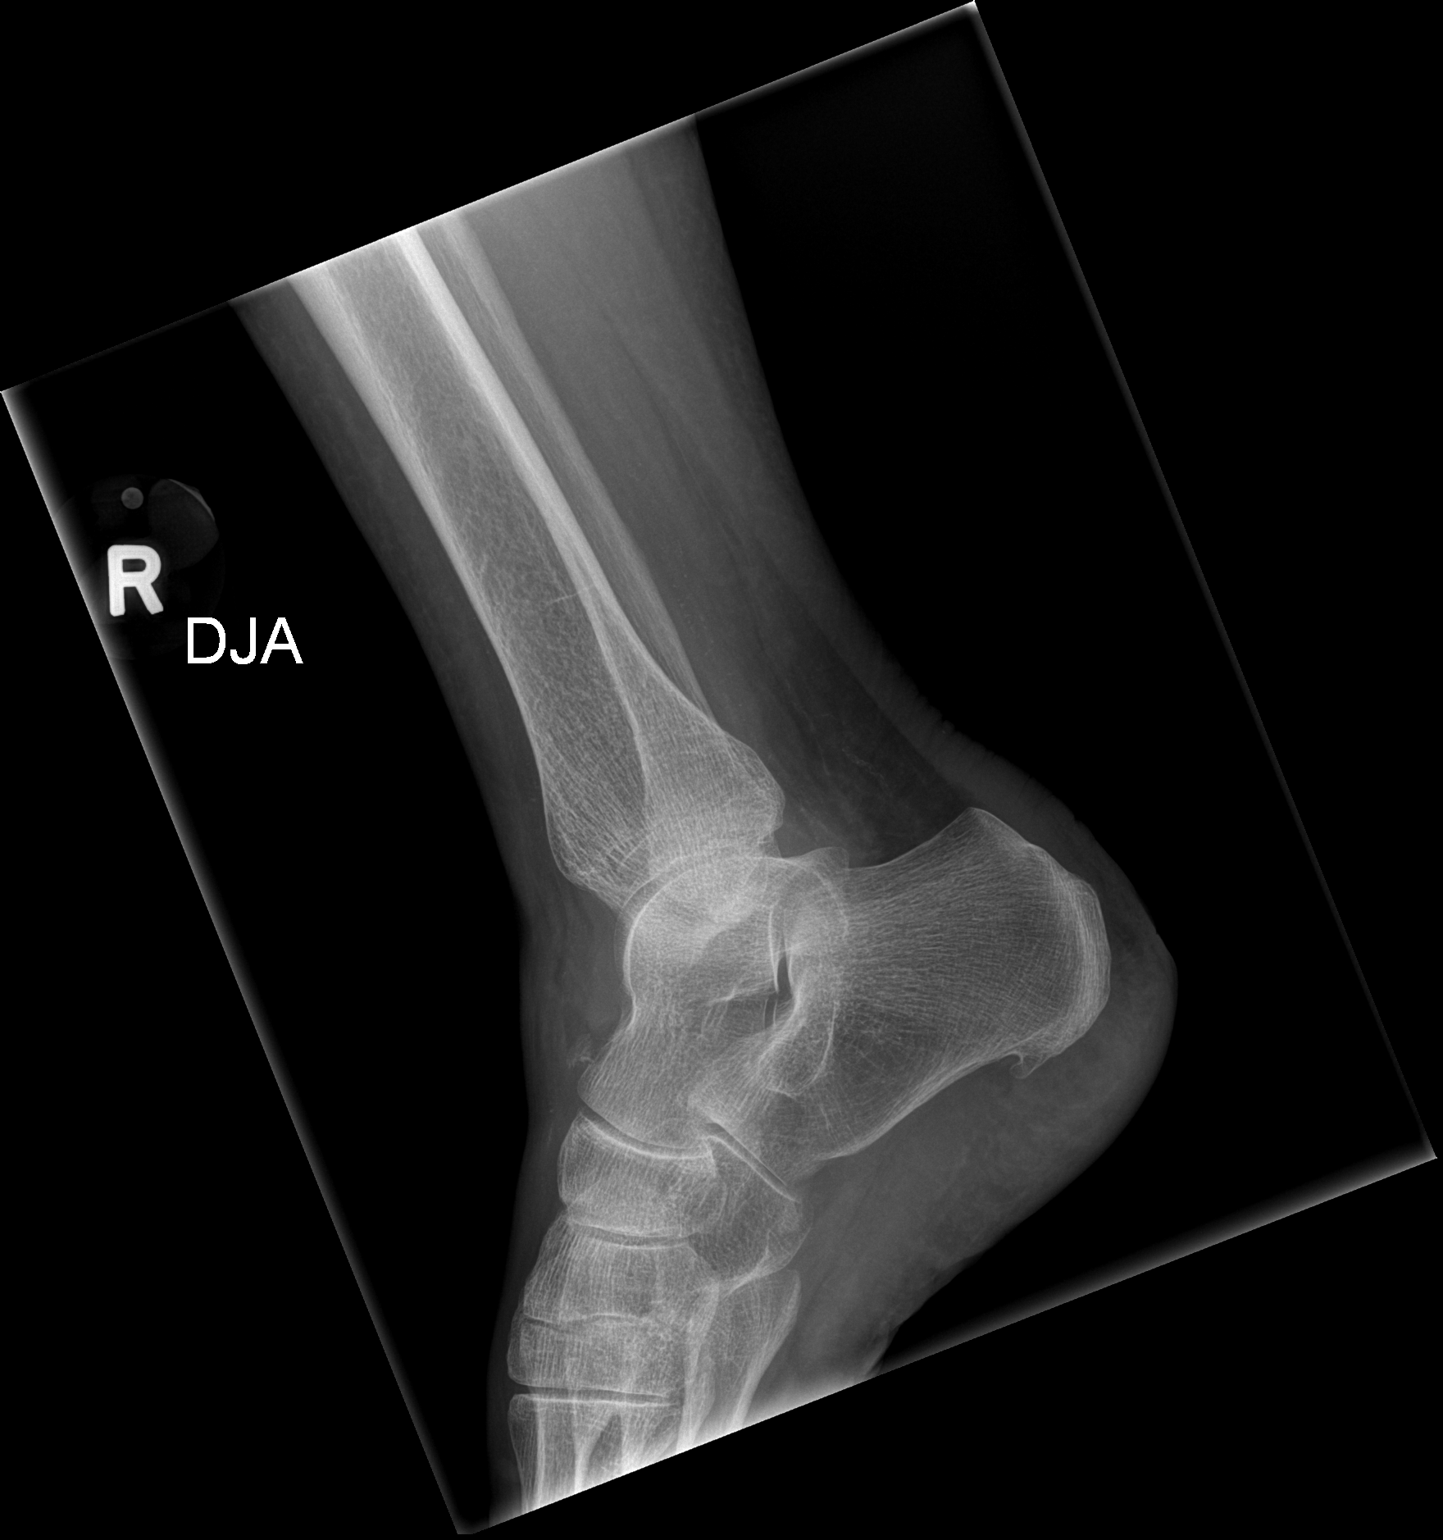

[2 of 2 positions shown; findings below may reference images not displayed]

FINDINGS: Frontal and lateral views were obtained. There is no demonstrable
fracture. There is a small joint effusion. The ankle mortise appears
intact. There is no appreciable joint space narrowing. There is a
small inferior calcaneal spur. There are foci of arterial vascular
calcification.
IMPRESSION: No demonstrable fracture. Small joint effusion. Small inferior
calcaneal spur. Scattered foci of atherosclerotic calcification.
Ankle mortise appears intact.

## 2017-11-06 IMAGING — CR DG FOREARM 2V*R*
1 series · 2 of 2 positions shown · non-contrast
Comparison: None.

CLINICAL DATA: Acute onset pain

EXAM:
RIGHT FOREARM - 2 VIEW

[Series 1: x forearm lat right · 0.14mm/px · 2 of 2 slices shown]
[im 1/2]
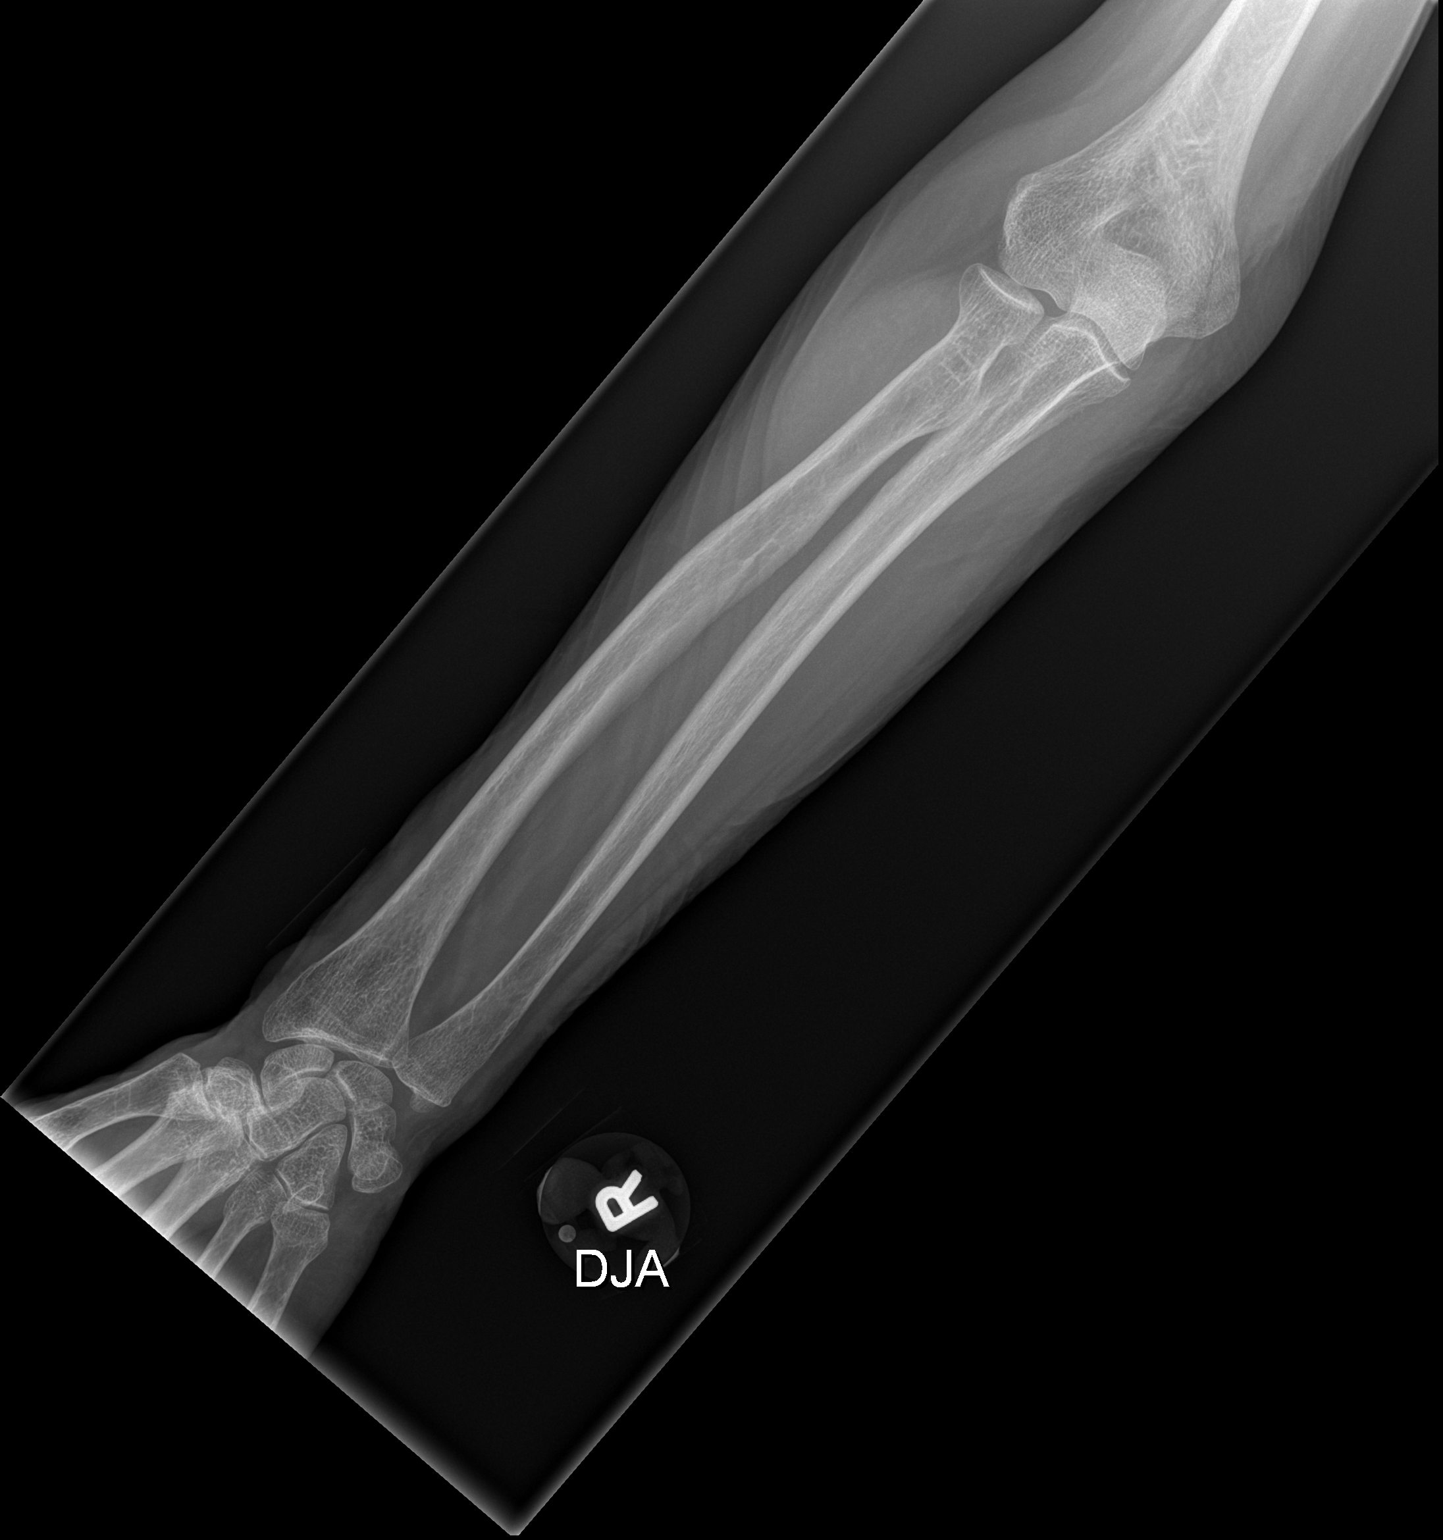
[im 2/2]
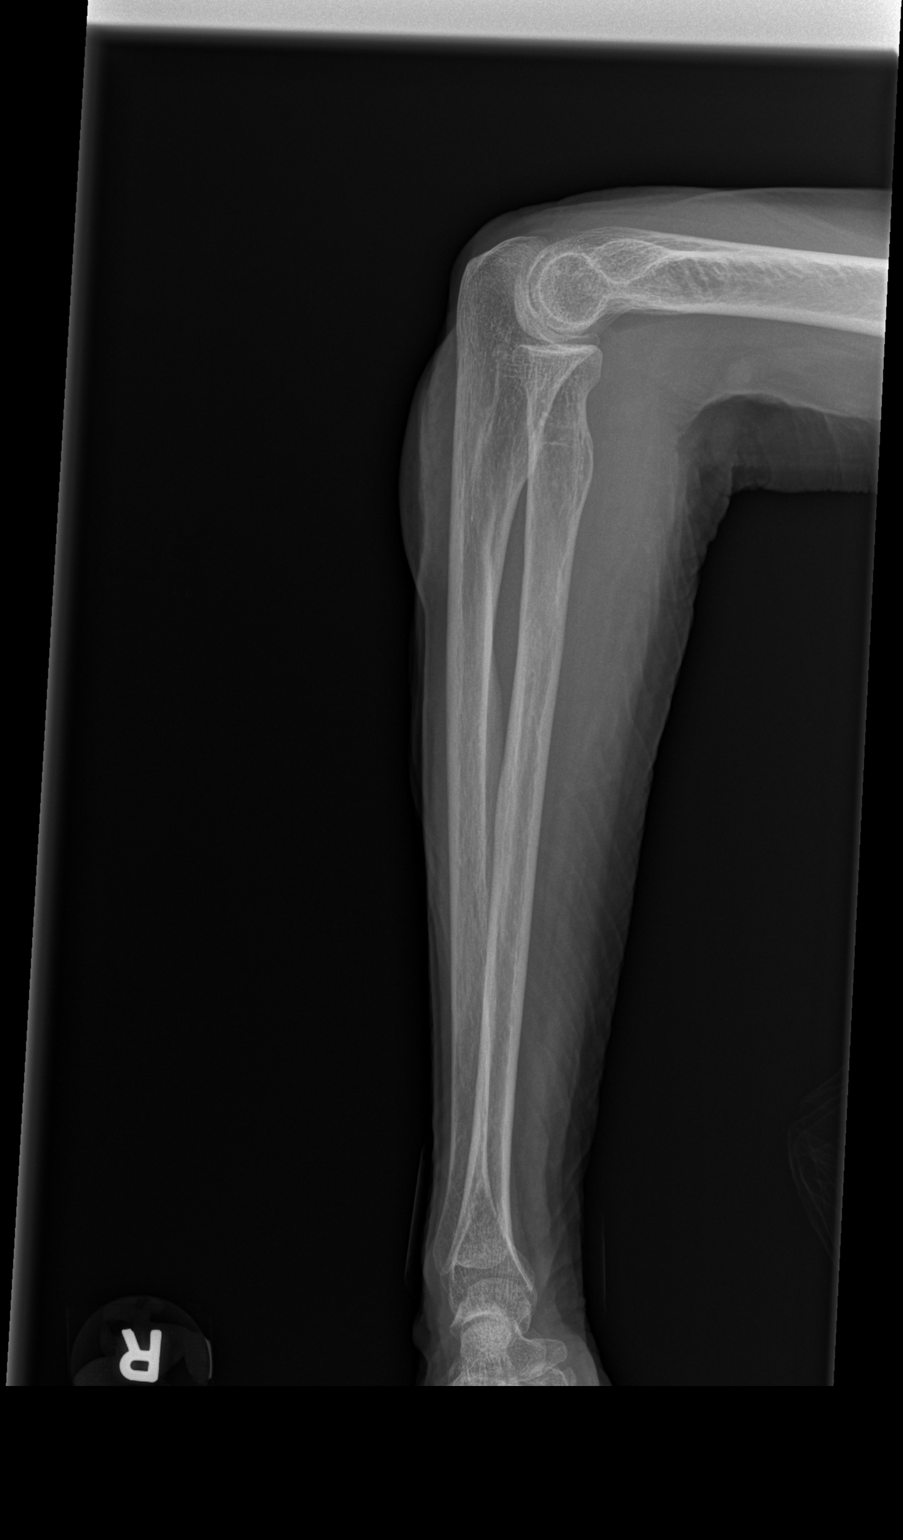

[2 of 2 positions shown; findings below may reference images not displayed]

FINDINGS: Frontal and lateral views were obtained. There is no demonstrable
fracture or dislocation. No joint effusion. The joint spaces appear
normal. No abnormal periosteal reaction. Bones appear somewhat
osteoporotic.
IMPRESSION: Bones appear somewhat osteoporotic. No fracture or dislocation. No
apparent arthropathy.

## 2017-11-11 ENCOUNTER — Encounter: Payer: Self-pay | Admitting: Obstetrics and Gynecology

## 2017-11-11 ENCOUNTER — Encounter: Payer: Medicare Other | Admitting: Obstetrics and Gynecology

## 2017-11-11 ENCOUNTER — Ambulatory Visit (INDEPENDENT_AMBULATORY_CARE_PROVIDER_SITE_OTHER): Payer: Medicare Other | Admitting: Obstetrics and Gynecology

## 2017-11-11 VITALS — BP 164/73 | HR 76 | Ht 61.0 in | Wt 102.1 lb

## 2017-11-11 DIAGNOSIS — N904 Leukoplakia of vulva: Secondary | ICD-10-CM | POA: Diagnosis not present

## 2017-11-11 DIAGNOSIS — R208 Other disturbances of skin sensation: Secondary | ICD-10-CM | POA: Diagnosis not present

## 2017-11-11 DIAGNOSIS — K5900 Constipation, unspecified: Secondary | ICD-10-CM | POA: Insufficient documentation

## 2017-11-11 NOTE — Patient Instructions (Signed)
1.  Decrease Temovate ointment 0.05% application to 2 times a week. 2.  Begin using Metamucil daily as a fiber additive 3.  Increase water intake to at least 6 glasses of water a day in order to help with constipation 4.  Return in 6 months for follow-up on lichen sclerosus and constipation; may contact the office sooner if flare in symptoms develop such as increased vulvar burning or itching or worsening constipation.

## 2017-11-11 NOTE — Progress Notes (Signed)
Chief complaint: 1.  Lichen sclerosis 2.  Vulvar atrophy 3.  Rectal burning  Susan Booth presents for 6833-month follow-up.  At last visit she had an increase in Temovate ointment therapy to every other day application due to persistent symptoms of vulvar itching and burning along with chronic skin changes noted on the perineum.  She has been compliant with medication usage.  Symptoms at this time is notable mainly for rectal burning that tends to come and go, but is more often associated with constipation that is erratic in nature.  She states that her diet at times may not be optimal in order to promote healthy bowel function.  She does not report history of hemorrhoids.  She reports no rectal bleeding.  Past medical history, past surgeries, problem list, medications, and allergies are reviewed  OBJECTIVE: BP (!) 164/73   Pulse 76   Ht 5\' 1"  (1.549 m)   Wt 102 lb 1.6 oz (46.3 kg)   BMI 19.29 kg/m  Pleasant well-appearing female no acute distress.  Alert and oriented. Affect is appropriate. Abdomen: Soft, nontender Bladder: Nontender Pelvic exam: External genitalia-atrophic labia with agglutination of the labia minora to the labia majora; no evidence of leukoplakia, epithelial skin breakdown, or ulceration Vagina-atrophic changes present; no discharge Bimanual-deferred Rectovaginal-no significant chronic skin changes seen today (resolved); no hemorrhoids; no rectal discharge or bleeding  ASSESSMENT: 1.  Lichen sclerosis, with some improvement with increased dosing regimen over the past 3 months 2.  Sporadic rectal burning, associated with constipation  PLAN: 1.  Decrease Temovate ointment therapy to 0.205% application twice a week 2.  Begin Metamucil therapy for adding fiber in diet to help with constipation 3.  Increase water intake to at least 6 glasses of water a day 4.  Return in 6 months for follow-up or sooner if exacerbation of symptoms occur.  A total of 15 minutes were spent  face-to-face with the patient during this encounter and over half of that time dealt with counseling and coordination of care.  Herold HarmsMartin A Defrancesco, MD  Note: This dictation was prepared with Dragon dictation along with smaller phrase technology. Any transcriptional errors that result from this process are unintentional.

## 2017-11-14 IMAGING — DX DG KNEE 1-2V PORT*L*
2 series · 2 of 2 positions shown · non-contrast
Comparison: Intraoperative images [id number] hours today.

CLINICAL DATA: 84-year-old female status post surgery for patella
fracture. Initial encounter.

EXAM:
PORTABLE LEFT KNEE - 1-2 VIEW

[knee ap]
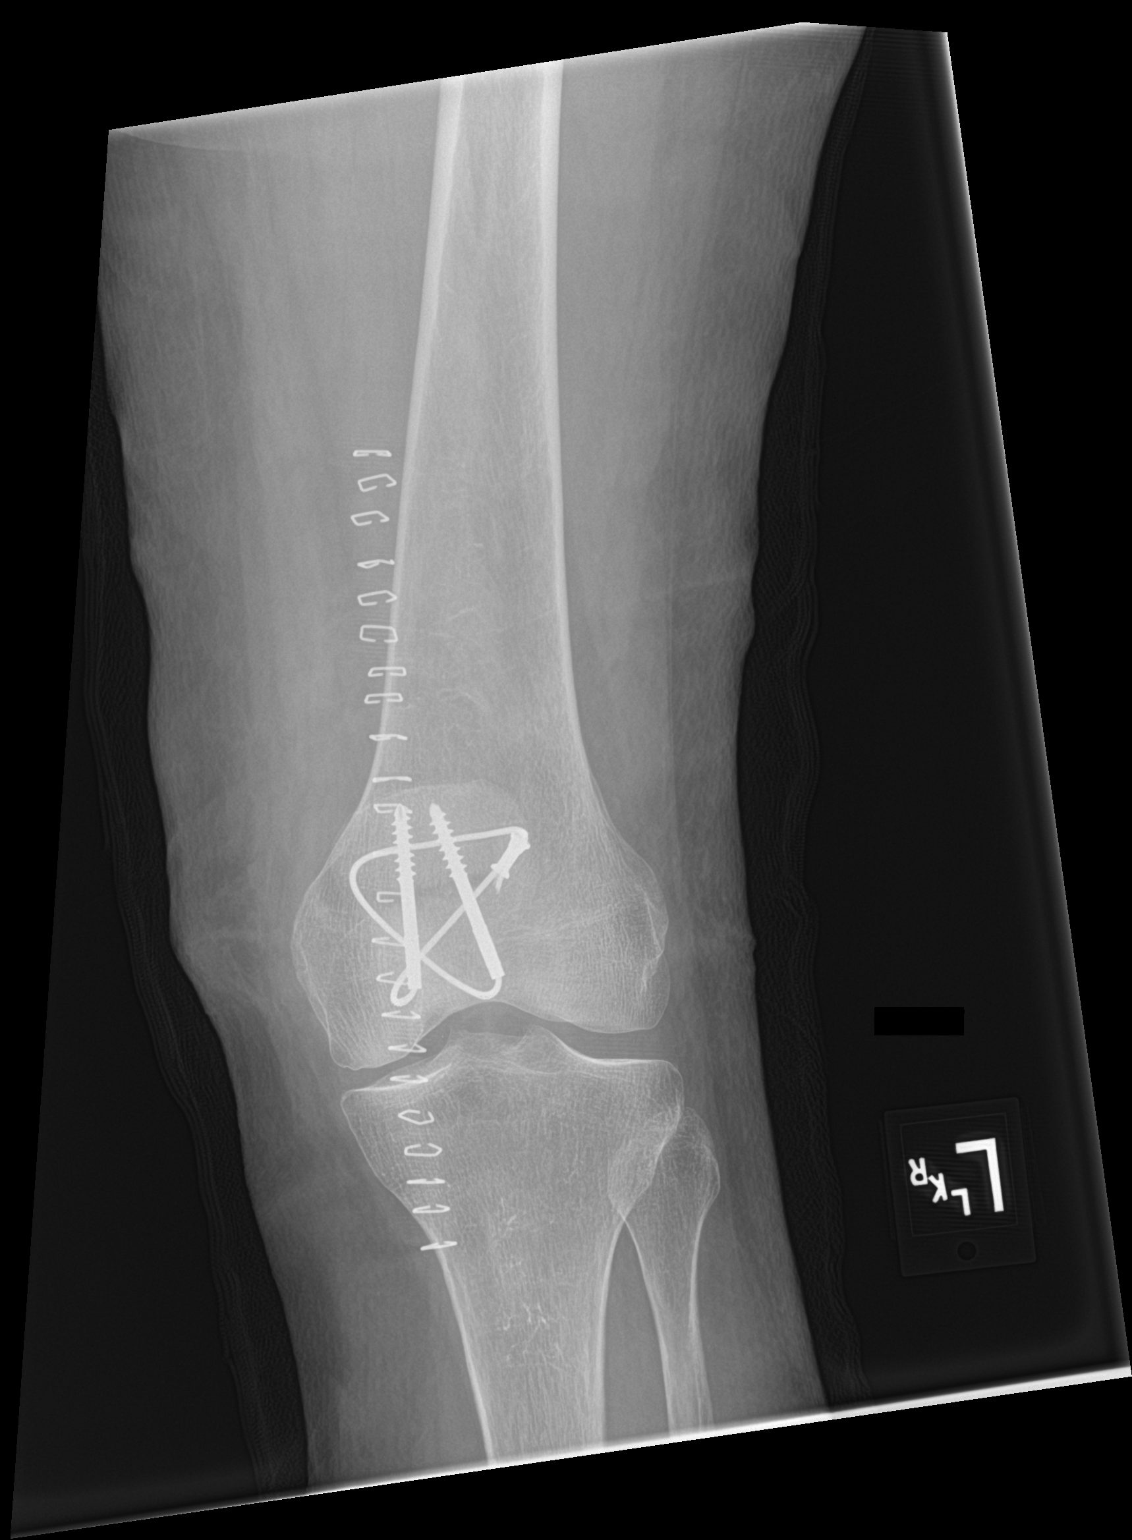

[knee lat]
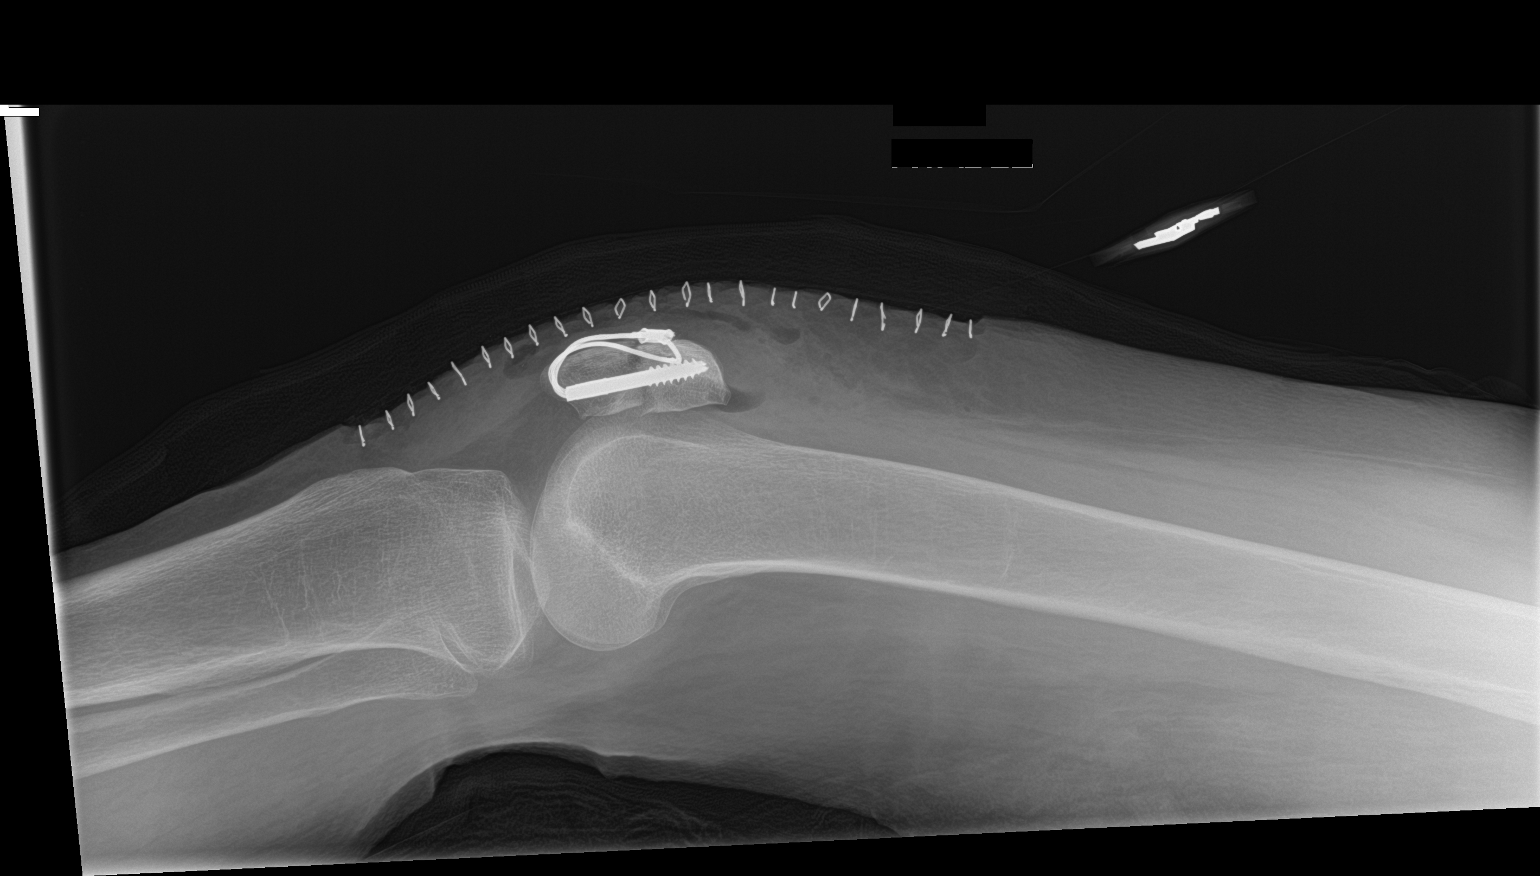

[2 of 2 positions shown; findings below may reference images not displayed]

FINDINGS: Portable AP and cross-table lateral views demonstrate ORIF of the
transverse comminuted patella fracture with near anatomic alignment.
Overlying skin staples. Small volume of suprapatellar fluid Brack Tiger gas
as well as subcutaneous gas suspected. Overlying anterior skin
staples. Underlying normal bone mineralization. Other knee joint
spaces and alignment are preserved.
IMPRESSION: Status post ORIF of left patella fracture with no adverse features
identified.

## 2017-11-24 ENCOUNTER — Encounter: Payer: Self-pay | Admitting: Family Medicine

## 2017-11-24 ENCOUNTER — Ambulatory Visit (INDEPENDENT_AMBULATORY_CARE_PROVIDER_SITE_OTHER): Payer: Medicare Other | Admitting: Family Medicine

## 2017-11-24 VITALS — BP 154/78 | HR 73 | Temp 97.7°F | Ht 61.0 in | Wt 102.6 lb

## 2017-11-24 DIAGNOSIS — R739 Hyperglycemia, unspecified: Secondary | ICD-10-CM

## 2017-11-24 DIAGNOSIS — R450 Nervousness: Secondary | ICD-10-CM | POA: Diagnosis not present

## 2017-11-24 DIAGNOSIS — R5383 Other fatigue: Secondary | ICD-10-CM | POA: Diagnosis not present

## 2017-11-24 DIAGNOSIS — Z0000 Encounter for general adult medical examination without abnormal findings: Secondary | ICD-10-CM

## 2017-11-24 DIAGNOSIS — F419 Anxiety disorder, unspecified: Secondary | ICD-10-CM

## 2017-11-24 DIAGNOSIS — I10 Essential (primary) hypertension: Secondary | ICD-10-CM

## 2017-11-24 MED ORDER — AMLODIPINE BESYLATE 2.5 MG PO TABS: 2.5000 mg | ORAL_TABLET | Freq: Every day | ORAL | 3 refills | Status: DC

## 2017-11-24 NOTE — Progress Notes (Signed)
Patient: Susan Booth, Female    DOB: November 25, 1930, 84 y.o.   MRN: [id number] Visit Date: 11/24/2017  Today's Provider: Shirlee Latch, MD   Chief Complaint  Patient presents with  . Annual Exam   Subjective:  I, Presley Raddle, CMA, am acting as a scribe for Shirlee Latch, MD.   Patient had a AWE on 11/03/17 with [facility]. Reviewed note.   Complete Physical SHALEKA Booth is a 84 y.o. female. She feels poorly. She states she feels "jittery" and fatigue. She reports exercising none. She states she does not have time to exercise. She reports she is sleeping poorly.  Patient's main concern today is a feeling of jitteriness and fatigue.  She states that this is a chronic and intermittent issue, but it is now becoming more frequent.  She wonders if she may have diabetes that is causing this.  She has never checked her blood sugar when it occurs.  She notices that her blood pressure is consistently running high, even when she is calm.  States that she is having a lot of stress currently as her husband recently had spinal surgery and her sister had a spinal fracture.  She does take Ativan as needed, but on average only takes this a few times per month.  She states this does help with her jitteriness when she takes this.  She has never taken a daily medication for anxiety.  HTN: - Medications: losartan-HCTZ 100-25mg  daily - Compliance: good - Checking BP at home: yes, states that it is high, but it fluctuates - Denies any SOB, CP, vision changes, LE edema, medication SEs, or symptoms of hypotension -----------------------------------------------------------   Review of Systems  Constitutional: Positive for fatigue.  HENT: Negative.   Eyes: Negative.   Respiratory: Negative.   Cardiovascular: Negative.   Gastrointestinal: Negative.   Endocrine: Negative.   Genitourinary: Negative.   Musculoskeletal: Negative.   Skin: Negative.   Allergic/Immunologic: Negative.     Neurological: Negative.   Hematological: Negative.   Psychiatric/Behavioral: Positive for sleep disturbance.    Social History   Socioeconomic History  . Marital status: Married    Spouse name: Sherrine Maples  . Number of children: 2  . Years of education: Some College  . Highest education level: Some college, no degree  Occupational History  . Occupation: Retired  Engineer, production  . Financial resource strain: Not hard at all  . Food insecurity:    Worry: Never true    Inability: Never true  . Transportation needs:    Medical: No    Non-medical: No  Tobacco Use  . Smoking status: Never Smoker  . Smokeless tobacco: Never Used  Substance and Sexual Activity  . Alcohol use: No  . Drug use: No  . Sexual activity: Never  Lifestyle  . Physical activity:    Days per week: Not on file    Minutes per session: Not on file  . Stress: Only a little  Relationships  . Social connections:    Talks on phone: Not on file    Gets together: Not on file    Attends religious service: Not on file    Active member of club or organization: Not on file    Attends meetings of clubs or organizations: Not on file    Relationship status: Not on file  . Intimate partner violence:    Fear of current or ex partner: Not on file    Emotionally abused: Not on file  Physically abused: Not on file    Forced sexual activity: Not on file  Other Topics Concern  . Not on file  Social History Narrative  . Not on file    Past Medical History:  Diagnosis Date  . Anxiety   . Benign essential tremor   . Complication of anesthesia    nausea  . Gastroesophageal reflux disease   . Hypertension   . Lichen sclerosus et atrophicus of the vulva   . Murmur   . Need for SBE (subacute bacterial endocarditis) prophylaxis   . Osteoporosis, post-menopausal   . Other chest pain   . Paresthesia   . Rhinitis, allergic      Patient Active Problem List   Diagnosis Date Noted  . Rectal burning 11/11/2017  .  Constipation 11/11/2017  . Healthcare maintenance 11/18/2016  . Fatigue 04/29/2016  . Patella fracture 11/24/2015  . Rectal itching 07/04/2015  . Heart palpitations 11/25/2014  . Hypokalemia 09/09/2014  . Lichen sclerosus et atrophicus of the vulva   . Atypical chest pain 08/26/2014  . Paresthesia 08/26/2014  . Allergic rhinitis 07/22/2014  . Anxiety 07/22/2014  . Benign essential tremor 07/22/2014  . Acid reflux 07/22/2014  . OP (osteoporosis) 07/22/2014  . Bone/cartilage disorder 08/19/2008  . Nonrheumatic tricuspid valve disorder 08/19/2008  . Undiagnosed cardiac murmurs 08/15/2008  . Colon, diverticulosis 08/15/2008  . Benign hypertension 08/15/2008  . Primary pulmonary hypertension (HCC) 08/15/2008  . Heart murmur 08/15/2008  . H/O total hysterectomy 04/02/1967    Past Surgical History:  Procedure Laterality Date  . ABDOMINAL HYSTERECTOMY  1968  . ASD REPAIR, SECUNDUM  03/2007  . CATARACT EXTRACTION Bilateral 06/2012  . CORRECTION HAMMER TOE Bilateral 2003  . hole in heart repaired     continue to have some leakage  . ORIF PATELLA Left 11/24/2015   Procedure: OPEN REDUCTION INTERNAL (ORIF) FIXATION PATELLA;  Surgeon: Juanell Fairly, MD;  Location: [facility] ORS;  Service: Orthopedics;  Laterality: Left;  (PER Dr Rico Ala Spinal would be ideal     Her family history includes Breast cancer (age of onset: 74) in her maternal aunt; COPD in her mother; Early death in her mother; Heart attack in her father; Heart disease in her father and sister; Lung cancer in her mother; Prostate cancer in her son and son.      Current Outpatient Medications:  .  amoxicillin (AMOXIL) 500 MG tablet, Take 500 mg by mouth. Take 1 tablet one hour prior to dental work., Disp: , Rfl:  .  aspirin EC 81 MG tablet, Take 81 mg by mouth daily., Disp: , Rfl:  .  Calcium Carbonate-Vitamin D 600-400 MG-UNIT per tablet, CALTRATE 600+D, 600-400MG -UNIT (Oral Tablet)  1 Every Day for 0 days  Quantity: 0.00;   Refills: 0   Ordered :12-Jan-2010  [name] ;  Started 15-Aug-2008 Active Comments: DX: 733.90, Disp: , Rfl:  .  clobetasol cream (TEMOVATE) 0.05 %, APPLY ONE APPLICATION TOPICALLY TWO TIMES A WEEK, Disp: 30 g, Rfl: 1 .  fluticasone (FLONASE) 50 MCG/ACT nasal spray, Place 1 spray into both nostrils daily as needed., Disp: 48 g, Rfl: 3 .  LORazepam (ATIVAN) 0.5 MG tablet, TAKE 1 TABLET BY MOUTH TWICE DAILY AND THEN 1 EVERY 6 HOURS AS NEEDED, Disp: 120 tablet, Rfl: 5 .  losartan-hydrochlorothiazide (HYZAAR) 100-25 MG tablet, Take 1 tablet by mouth daily., Disp: 90 tablet, Rfl: 3 .  potassium chloride SA (K-DUR,KLOR-CON) 20 MEQ tablet, TAKE ONE (1) TABLET BY MOUTH TWO (2) TIMES DAILY,  Disp: 180 tablet, Rfl: 3  Patient Care Team: Erasmo Downer, MD as PCP - General (Family Medicine) Defrancesco, Prentice Docker, MD as Consulting Physician (Obstetrics and Gynecology) Sallee Lange, MD as Consulting Physician (Ophthalmology) Lady Gary Darlin Priestly, MD as Consulting Physician (Cardiology)     Objective:   Vitals: BP (!) 154/78 (BP Location: Right Arm, Patient Position: Sitting, Cuff Size: Normal)   Pulse 73   Temp 97.7 F (36.5 C) (Oral)   Ht 5\' 1"  (1.549 m)   Wt 102 lb 9.6 oz (46.5 kg)   SpO2 98%   BMI 19.39 kg/m   Physical Exam  Constitutional: She is oriented to person, place, and time. She appears well-developed and well-nourished. No distress.  HENT:  Head: Normocephalic and atraumatic.  Right Ear: External ear normal.  Left Ear: External ear normal.  Nose: Nose normal.  Mouth/Throat: Oropharynx is clear and moist.  Eyes: Pupils are equal, round, and reactive to light. Conjunctivae and EOM are normal. No scleral icterus.  Neck: Neck supple. No thyromegaly present.  Cardiovascular: Normal rate, regular rhythm and intact distal pulses.  Murmur heard. Pulmonary/Chest: Effort normal and breath sounds normal. No respiratory distress. She has no wheezes. She has no rales.  Abdominal:  Soft. Bowel sounds are normal. She exhibits no distension. There is no tenderness. There is no rebound and no guarding.  Musculoskeletal: She exhibits no edema or deformity.  Lymphadenopathy:    She has no cervical adenopathy.  Neurological: She is alert and oriented to person, place, and time.  Skin: Skin is warm and dry. Capillary refill takes less than 2 seconds. No rash noted.  Psychiatric: She has a normal mood and affect. Her behavior is normal.  Vitals reviewed.   Activities of Daily Living In your present state of health, do you have any difficulty performing the following activities: 11/03/2017  Hearing? Y  Comment Wears bilateral hearing aids.  Vision? N  Difficulty concentrating or making decisions? N  Walking or climbing stairs? N  Dressing or bathing? N  Doing errands, shopping? N  Preparing Food and eating ? N  Using the Toilet? N  In the past six months, have you accidently leaked urine? N  Do you have problems with loss of bowel control? N  Managing your Medications? N  Managing your Finances? N  Housekeeping or managing your Housekeeping? N  Some recent data might be hidden    Fall Risk Assessment Fall Risk  11/03/2017 11/18/2016 09/11/2015 09/09/2014  Falls in the past year? Yes No No No  Number falls in past yr: 1 - - -  Injury with Fall? Yes - - -  Comment sprained ankle - - -  Follow up Falls prevention discussed - - -     Depression Screen PHQ 2/9 Scores 11/03/2017 11/18/2016 09/11/2015 09/09/2014  PHQ - 2 Score 0 0 0 0    Assessment & Plan:    Annual Physical Reviewed patient's Family Medical History Reviewed and updated list of patient's medical providers Assessment of cognitive impairment was done Assessed patient's functional ability Established a written schedule for health screening services Health Risk Assessent Completed and Reviewed  Exercise Activities and Dietary recommendations Goals    . DIET - INCREASE WATER INTAKE     Recommend  increasing water intake to 4-6 glasses a day.        Immunization History  Administered Date(s) Administered  . Influenza, High Dose Seasonal PF 01/05/2014  . Influenza-Unspecified 12/29/2016  . Pneumococcal Conjugate-13 09/06/2013  .  Pneumococcal Polysaccharide-23 02/08/2004  . Td 07/12/2003  . Zoster 09/29/2010    Health Maintenance  Topic Date Due  . Janet BerlinETANUS/TDAP  07/11/2013  . INFLUENZA VACCINE  10/30/2017  . DEXA SCAN  Completed  . PNA vac Low Risk Adult  Completed     Discussed health benefits of physical activity, and encouraged her to engage in regular exercise appropriate for her age and condition.    Patient to discuss Shingrix with her insurance company and call for an appointment if this is covered ------------------------------------------------------------------------------------------------------------  Problem List Items Addressed This Visit      Cardiovascular and Mediastinum   Benign hypertension    Elevated for the last 2 visits in our clinic and elevated at home Uncontrolled Continue losartan HCTZ at current dose Patient had some side effects from amlodipine 5 mg in the past, but she cannot remember She is willing to try amlodipine 2.5 mg daily Recheck CMP Follow-up in 6 weeks and adjust antihypertensives as needed      Relevant Medications   amLODipine (NORVASC) 2.5 MG tablet   Other Relevant Orders   Comprehensive metabolic panel   Lipid panel     Other   Anxiety    Chronic and previously well controlled Patient understands the risks of continued benzo use at her advanced age and she uses this very limitedly We discussed that her jitteriness and fatigue could be related to untreated anxiety At follow-up in 6 weeks, if this is still occurring despite normal lab work, we will consider SSRI      Relevant Orders   CBC   TSH   Fatigue    Chronic and uncontrolled Likely multifactorial related to advanced age, anxiety, etc. Check labs  including CBC, CMP, TSH      Relevant Orders   CBC   TSH   Jittery    New problem Patient has had hyperglycemia in the past, so will check A1c to ensure that diabetes is not causing this Discussed with patient that this is likely a somatic symptom from uncontrolled anxiety Check CBC, CMP, TSH, A1c, lipid panel today to ensure no secondary cause At follow-up appointment in 6 weeks, if all else is normal, we could consider an SSRI for anxiety to see if this will help      Relevant Orders   CBC   TSH   Hyperglycemia    Check A1c      Relevant Orders   Hemoglobin A1c    Other Visit Diagnoses    Encounter for annual physical exam    -  Primary   Relevant Orders   Lipid panel   TSH       Return in about 6 weeks (around 01/05/2018) for blood pressure f/u.   The entirety of the information documented in the History of Present Illness, Review of Systems and Physical Exam were personally obtained by me. Portions of this information were initially documented by Presley RaddleNikki Walston, CMA and reviewed by me for thoroughness and accuracy.    Erasmo DownerBacigalupo, Nitin Mckowen M, MD, MPH [facility] 11/24/2017 9:49 AM

## 2017-11-24 NOTE — Assessment & Plan Note (Signed)
Elevated for the last 2 visits in our clinic and elevated at home Uncontrolled Continue losartan HCTZ at current dose Patient had some side effects from amlodipine 5 mg in the past, but she cannot remember She is willing to try amlodipine 2.5 mg daily Recheck CMP Follow-up in 6 weeks and adjust antihypertensives as needed

## 2017-11-24 NOTE — Assessment & Plan Note (Signed)
Chronic and uncontrolled Likely multifactorial related to advanced age, anxiety, etc. Check labs including CBC, CMP, TSH

## 2017-11-24 NOTE — Patient Instructions (Signed)
The CDC recommends two doses of Shingrix (the shingles vaccine) separated by 2 to 6 months for adults age 82 years and older. I recommend checking with your insurance plan regarding coverage for this vaccine.   Continue losartan-hctz at current Start amlodipine 2.5 mg daily also for blood pressure  We will check labs to see about the jitteriness   Preventive Care 65 Years and Older, Female Preventive care refers to lifestyle choices and visits with your health care provider that can promote health and wellness. What does preventive care include?  A yearly physical exam. This is also called an annual well check.  Dental exams once or twice a year.  Routine eye exams. Ask your health care provider how often you should have your eyes checked.  Personal lifestyle choices, including: ? Daily care of your teeth and gums. ? Regular physical activity. ? Eating a healthy diet. ? Avoiding tobacco and drug use. ? Limiting alcohol use. ? Practicing safe sex. ? Taking low-dose aspirin every day. ? Taking vitamin and mineral supplements as recommended by your health care provider. What happens during an annual well check? The services and screenings done by your health care provider during your annual well check will depend on your age, overall health, lifestyle risk factors, and family history of disease. Counseling Your health care provider may ask you questions about your:  Alcohol use.  Tobacco use.  Drug use.  Emotional well-being.  Home and relationship well-being.  Sexual activity.  Eating habits.  History of falls.  Memory and ability to understand (cognition).  Work and work Statistician.  Reproductive health.  Screening You may have the following tests or measurements:  Height, weight, and BMI.  Blood pressure.  Lipid and cholesterol levels. These may be checked every 5 years, or more frequently if you are over 40 years old.  Skin check.  Lung cancer  screening. You may have this screening every year starting at age 59 if you have a 30-pack-year history of smoking and currently smoke or have quit within the past 15 years.  Fecal occult blood test (FOBT) of the stool. You may have this test every year starting at age 52.  Flexible sigmoidoscopy or colonoscopy. You may have a sigmoidoscopy every 5 years or a colonoscopy every 10 years starting at age 37.  Hepatitis C blood test.  Hepatitis B blood test.  Sexually transmitted disease (STD) testing.  Diabetes screening. This is done by checking your blood sugar (glucose) after you have not eaten for a while (fasting). You may have this done every 1-3 years.  Bone density scan. This is done to screen for osteoporosis. You may have this done starting at age 67.  Mammogram. This may be done every 1-2 years. Talk to your health care provider about how often you should have regular mammograms.  Talk with your health care provider about your test results, treatment options, and if necessary, the need for more tests. Vaccines Your health care provider may recommend certain vaccines, such as:  Influenza vaccine. This is recommended every year.  Tetanus, diphtheria, and acellular pertussis (Tdap, Td) vaccine. You may need a Td booster every 10 years.  Varicella vaccine. You may need this if you have not been vaccinated.  Zoster vaccine. You may need this after age 43.  Measles, mumps, and rubella (MMR) vaccine. You may need at least one dose of MMR if you were born in 1957 or later. You may also need a second dose.  Pneumococcal  13-valent conjugate (PCV13) vaccine. One dose is recommended after age 38.  Pneumococcal polysaccharide (PPSV23) vaccine. One dose is recommended after age 46.  Meningococcal vaccine. You may need this if you have certain conditions.  Hepatitis A vaccine. You may need this if you have certain conditions or if you travel or work in places where you may be exposed  to hepatitis A.  Hepatitis B vaccine. You may need this if you have certain conditions or if you travel or work in places where you may be exposed to hepatitis B.  Haemophilus influenzae type b (Hib) vaccine. You may need this if you have certain conditions.  Talk to your health care provider about which screenings and vaccines you need and how often you need them. This information is not intended to replace advice given to you by your health care provider. Make sure you discuss any questions you have with your health care provider. Document Released: 04/14/2015 Document Revised: 12/06/2015 Document Reviewed: 01/17/2015 Elsevier Interactive Patient Education  [employer].

## 2017-11-24 NOTE — Assessment & Plan Note (Signed)
Chronic and previously well controlled Patient understands the risks of continued benzo use at her advanced age and she uses this very limitedly We discussed that her jitteriness and fatigue could be related to untreated anxiety At follow-up in 6 weeks, if this is still occurring despite normal lab work, we will consider SSRI

## 2017-11-24 NOTE — Assessment & Plan Note (Signed)
Check A1c. 

## 2017-11-24 NOTE — Assessment & Plan Note (Signed)
New problem Patient has had hyperglycemia in the past, so will check A1c to ensure that diabetes is not causing this Discussed with patient that this is likely a somatic symptom from uncontrolled anxiety Check CBC, CMP, TSH, A1c, lipid panel today to ensure no secondary cause At follow-up appointment in 6 weeks, if all else is normal, we could consider an SSRI for anxiety to see if this will help

## 2017-11-25 ENCOUNTER — Telehealth: Payer: Self-pay

## 2017-11-25 LAB — COMPREHENSIVE METABOLIC PANEL
ALBUMIN: 3.9 g/dL (ref 3.5–4.7)
ALK PHOS: 73 IU/L (ref 39–117)
ALT: 9 IU/L (ref 0–32)
AST: 20 IU/L (ref 0–40)
Albumin/Globulin Ratio: 1.4 (ref 1.2–2.2)
BILIRUBIN TOTAL: 0.6 mg/dL (ref 0.0–1.2)
BUN / CREAT RATIO: 11 — AB (ref 12–28)
BUN: 10 mg/dL (ref 8–27)
CHLORIDE: 100 mmol/L (ref 96–106)
CO2: 25 mmol/L (ref 20–29)
Calcium: 9.5 mg/dL (ref 8.7–10.3)
Creatinine, Ser: 0.9 mg/dL (ref 0.57–1.00)
GFR calc Af Amer: 68 mL/min/{1.73_m2} (ref >59)
GFR calc non Af Amer: 59 mL/min/{1.73_m2} — ABNORMAL LOW (ref >59)
GLUCOSE: 97 mg/dL (ref 65–99)
Globulin, Total: 2.7 g/dL (ref 1.5–4.5)
Potassium: 3.9 mmol/L (ref 3.5–5.2)
SODIUM: 139 mmol/L (ref 134–144)
Total Protein: 6.6 g/dL (ref 6.0–8.5)

## 2017-11-25 LAB — LIPID PANEL
Chol/HDL Ratio: 2.9 ratio (ref 0.0–4.4)
Cholesterol, Total: 162 mg/dL (ref 100–199)
HDL: 56 mg/dL (ref >39)
LDL Calculated: 97 mg/dL (ref 0–99)
Triglycerides: 46 mg/dL (ref 0–149)
VLDL Cholesterol Cal: 9 mg/dL (ref 5–40)

## 2017-11-25 LAB — TSH: TSH: 1.58 u[IU]/mL (ref 0.450–4.500)

## 2017-11-25 LAB — CBC
Hematocrit: 42.3 % (ref 34.0–46.6)
Hemoglobin: 14 g/dL (ref 11.1–15.9)
MCH: 31.1 pg (ref 26.6–33.0)
MCHC: 33.1 g/dL (ref 31.5–35.7)
MCV: 94 fL (ref 79–97)
Platelets: 261 10*3/uL (ref 150–450)
RBC: 4.5 x10E6/uL (ref 3.77–5.28)
RDW: 13.4 % (ref 12.3–15.4)
WBC: 3.8 10*3/uL (ref 3.4–10.8)

## 2017-11-25 LAB — HEMOGLOBIN A1C
ESTIMATED AVERAGE GLUCOSE: 108 mg/dL
HEMOGLOBIN A1C: 5.4 % (ref 4.8–5.6)

## 2017-11-25 NOTE — Telephone Encounter (Signed)
Patient advised.

## 2017-11-25 NOTE — Telephone Encounter (Signed)
-----   Message from Erasmo DownerAngela M Bacigalupo, MD sent at 11/25/2017  8:58 AM EDT ----- Normal blood counts, electrolytes, kidney function, liver function, thyroid function, cholesterol, hemoglobin A1c (4023-month average of blood sugars).  No explanation for the jitteriness and fatigue.  Erasmo DownerBacigalupo, Angela M, MD, MPH [facility] 11/25/2017 8:58 AM

## 2018-01-05 ENCOUNTER — Ambulatory Visit: Payer: Medicare Other | Admitting: Family Medicine

## 2018-01-05 ENCOUNTER — Encounter: Payer: Self-pay | Admitting: Family Medicine

## 2018-01-05 VITALS — BP 128/62 | HR 72 | Temp 97.8°F | Wt 105.0 lb

## 2018-01-05 DIAGNOSIS — I10 Essential (primary) hypertension: Secondary | ICD-10-CM

## 2018-01-05 MED ORDER — AMLODIPINE BESYLATE 2.5 MG PO TABS: 2.5000 mg | ORAL_TABLET | Freq: Every day | ORAL | 3 refills | Status: DC

## 2018-01-05 NOTE — Assessment & Plan Note (Signed)
Well controlled Continue losartan-hctz and amlodipine at current doses Recent CMP reviewed F/u in 6 months

## 2018-01-05 NOTE — Progress Notes (Signed)
Patient: Susan Booth Female    DOB: 03-18-1931   84 y.o.   MRN: [id number] Visit Date: 01/05/2018  Today's Provider: Shirlee Latch, MD   Chief Complaint  Patient presents with  . Hypertension   Subjective:    I, Presley Raddle, CMA, am acting as a Neurosurgeon for Shirlee Latch, MD.   HPI  Hypertension, follow-up:  BP Readings from Last 3 Encounters:  01/05/18 128/62  11/24/17 (!) 154/78  11/11/17 (!) 164/73    She was last seen for hypertension 6 weeks ago.  BP at that visit was 154/78. Management changes since that visit include continue Losartan-HCTZ and start Amlodipine 2.5 mg. She reports good compliance with treatment. She is not having side effects.  She is not exercising. She is adherent to low salt diet.   Outside blood pressures are being checked periodically. She is experiencing none.  Patient denies chest pain, chest pressure/discomfort, claudication, dyspnea, exertional chest pressure/discomfort, fatigue, irregular heart beat, lower extremity edema, near-syncope, orthopnea, palpitations, paroxysmal nocturnal dyspnea, syncope and tachypnea.   Cardiovascular risk factors include advanced age (older than 47 for men, 64 for women) and hypertension.  Use of agents associated with hypertension: none.     Weight trend: stable Wt Readings from Last 3 Encounters:  01/05/18 105 lb (47.6 kg)  11/24/17 102 lb 9.6 oz (46.5 kg)  11/11/17 102 lb 1.6 oz (46.3 kg)    Current diet: in general, a "healthy" diet  , well balanced  ------------------------------------------------------------------------   No Known Allergies   Current Outpatient Medications:  .  amLODipine (NORVASC) 2.5 MG tablet, Take 1 tablet (2.5 mg total) by mouth daily., Disp: 30 tablet, Rfl: 3 .  amoxicillin (AMOXIL) 500 MG tablet, Take 500 mg by mouth. Take 1 tablet one hour prior to dental work., Disp: , Rfl:  .  aspirin EC 81 MG tablet, Take 81 mg by mouth daily., Disp: , Rfl:  .   Calcium Carbonate-Vitamin D 600-400 MG-UNIT per tablet, CALTRATE 600+D, 600-400MG -UNIT (Oral Tablet)  1 Every Day for 0 days  Quantity: 0.00;  Refills: 0   Ordered :12-Jan-2010  [name] ;  Started 15-Aug-2008 Active Comments: DX: 733.90, Disp: , Rfl:  .  clobetasol cream (TEMOVATE) 0.05 %, APPLY ONE APPLICATION TOPICALLY TWO TIMES A WEEK, Disp: 30 g, Rfl: 1 .  fluticasone (FLONASE) 50 MCG/ACT nasal spray, Place 1 spray into both nostrils daily as needed., Disp: 48 g, Rfl: 3 .  LORazepam (ATIVAN) 0.5 MG tablet, TAKE 1 TABLET BY MOUTH TWICE DAILY AND THEN 1 EVERY 6 HOURS AS NEEDED, Disp: 120 tablet, Rfl: 5 .  losartan-hydrochlorothiazide (HYZAAR) 100-25 MG tablet, Take 1 tablet by mouth daily., Disp: 90 tablet, Rfl: 3 .  potassium chloride SA (K-DUR,KLOR-CON) 20 MEQ tablet, TAKE ONE (1) TABLET BY MOUTH TWO (2) TIMES DAILY, Disp: 180 tablet, Rfl: 3  Review of Systems  Constitutional: Negative.   Respiratory: Negative.   Cardiovascular: Negative.   Musculoskeletal: Negative.     Social History   Tobacco Use  . Smoking status: Never Smoker  . Smokeless tobacco: Never Used  Substance Use Topics  . Alcohol use: No   Objective:   BP 128/62 (BP Location: Right Arm, Patient Position: Sitting, Cuff Size: Normal)   Pulse 72   Temp 97.8 F (36.6 C) (Oral)   Wt 105 lb (47.6 kg)   SpO2 99%   BMI 19.84 kg/m  Vitals:   01/05/18 0830  BP: 128/62  Pulse: 72  Temp:  97.8 F (36.6 C)  TempSrc: Oral  SpO2: 99%  Weight: 105 lb (47.6 kg)     Physical Exam  Constitutional: She is oriented to person, place, and time. She appears well-developed and well-nourished. No distress.  HENT:  Head: Normocephalic and atraumatic.  Eyes: Conjunctivae are normal. No scleral icterus.  Neck: Neck supple. No thyromegaly present.  Cardiovascular: Normal rate, regular rhythm, normal heart sounds and intact distal pulses.  No murmur heard. Pulmonary/Chest: Effort normal and breath sounds normal. No  respiratory distress. She has no wheezes. She has no rales.  Musculoskeletal: She exhibits no edema.  Lymphadenopathy:    She has no cervical adenopathy.  Neurological: She is alert and oriented to person, place, and time.  Skin: Skin is warm and dry. Capillary refill takes less than 2 seconds. No rash noted.  Psychiatric: She has a normal mood and affect. Her behavior is normal.  Vitals reviewed.      Assessment & Plan:   Problem List Items Addressed This Visit      Cardiovascular and Mediastinum   Benign hypertension - Primary    Well controlled Continue losartan-hctz and amlodipine at current doses Recent CMP reviewed F/u in 6 months      Relevant Medications   amLODipine (NORVASC) 2.5 MG tablet       Return in about 6 months (around 07/07/2018) for chronic disease f/u.   The entirety of the information documented in the History of Present Illness, Review of Systems and Physical Exam were personally obtained by me. Portions of this information were initially documented by Presley Raddle, CMA and reviewed by me for thoroughness and accuracy.    Erasmo Downer, MD, MPH [facility] 01/05/2018 8:46 AM

## 2018-01-05 NOTE — Patient Instructions (Signed)
DASH Eating Plan DASH stands for "Dietary Approaches to Stop Hypertension." The DASH eating plan is a healthy eating plan that has been shown to reduce high blood pressure (hypertension). It may also reduce your risk for type 2 diabetes, heart disease, and stroke. The DASH eating plan may also help with weight loss. What are tips for following this plan? General guidelines  Avoid eating more than 2,300 mg (milligrams) of salt (sodium) a day. If you have hypertension, you may need to reduce your sodium intake to 1,500 mg a day.  Limit alcohol intake to no more than 1 drink a day for nonpregnant women and 2 drinks a day for men. One drink equals 12 oz of beer, 5 oz of wine, or 1 oz of hard liquor.  Work with your health care provider to maintain a healthy body weight or to lose weight. Ask what an ideal weight is for you.  Get at least 30 minutes of exercise that causes your heart to beat faster (aerobic exercise) most days of the week. Activities may include walking, swimming, or biking.  Work with your health care provider or diet and nutrition specialist (dietitian) to adjust your eating plan to your individual calorie needs. Reading food labels  Check food labels for the amount of sodium per serving. Choose foods with less than 5 percent of the Daily Value of sodium. Generally, foods with less than 300 mg of sodium per serving fit into this eating plan.  To find whole grains, look for the word "whole" as the first word in the ingredient list. Shopping  Buy products labeled as "low-sodium" or "no salt added."  Buy fresh foods. Avoid canned foods and premade or frozen meals. Cooking  Avoid adding salt when cooking. Use salt-free seasonings or herbs instead of table salt or sea salt. Check with your health care provider or pharmacist before using salt substitutes.  Do not fry foods. Cook foods using healthy methods such as baking, boiling, grilling, and broiling instead.  Cook with  heart-healthy oils, such as olive, canola, soybean, or sunflower oil. Meal planning   Eat a balanced diet that includes: ? 5 or more servings of fruits and vegetables each day. At each meal, try to fill half of your plate with fruits and vegetables. ? Up to 6-8 servings of whole grains each day. ? Less than 6 oz of lean meat, poultry, or fish each day. A 3-oz serving of meat is about the same size as a deck of cards. One egg equals 1 oz. ? 2 servings of low-fat dairy each day. ? A serving of nuts, seeds, or beans 5 times each week. ? Heart-healthy fats. Healthy fats called Omega-3 fatty acids are found in foods such as flaxseeds and coldwater fish, like sardines, salmon, and mackerel.  Limit how much you eat of the following: ? Canned or prepackaged foods. ? Food that is high in trans fat, such as fried foods. ? Food that is high in saturated fat, such as fatty meat. ? Sweets, desserts, sugary drinks, and other foods with added sugar. ? Full-fat dairy products.  Do not salt foods before eating.  Try to eat at least 2 vegetarian meals each week.  Eat more home-cooked food and less restaurant, buffet, and fast food.  When eating at a restaurant, ask that your food be prepared with less salt or no salt, if possible. What foods are recommended? The items listed may not be a complete list. Talk with your dietitian about what   dietary choices are best for you. Grains Whole-grain or whole-wheat bread. Whole-grain or whole-wheat pasta. Brown rice. Oatmeal. Quinoa. Bulgur. Whole-grain and low-sodium cereals. Pita bread. Low-fat, low-sodium crackers. Whole-wheat flour tortillas. Vegetables Fresh or frozen vegetables (raw, steamed, roasted, or grilled). Low-sodium or reduced-sodium tomato and vegetable juice. Low-sodium or reduced-sodium tomato sauce and tomato paste. Low-sodium or reduced-sodium canned vegetables. Fruits All fresh, dried, or frozen fruit. Canned fruit in natural juice (without  added sugar). Meat and other protein foods Skinless chicken or turkey. Ground chicken or turkey. Pork with fat trimmed off. Fish and seafood. Egg whites. Dried beans, peas, or lentils. Unsalted nuts, nut butters, and seeds. Unsalted canned beans. Lean cuts of beef with fat trimmed off. Low-sodium, lean deli meat. Dairy Low-fat (1%) or fat-free (skim) milk. Fat-free, low-fat, or reduced-fat cheeses. Nonfat, low-sodium ricotta or cottage cheese. Low-fat or nonfat yogurt. Low-fat, low-sodium cheese. Fats and oils Soft margarine without trans fats. Vegetable oil. Low-fat, reduced-fat, or light mayonnaise and salad dressings (reduced-sodium). Canola, safflower, olive, soybean, and sunflower oils. Avocado. Seasoning and other foods Herbs. Spices. Seasoning mixes without salt. Unsalted popcorn and pretzels. Fat-free sweets. What foods are not recommended? The items listed may not be a complete list. Talk with your dietitian about what dietary choices are best for you. Grains Baked goods made with fat, such as croissants, muffins, or some breads. Dry pasta or rice meal packs. Vegetables Creamed or fried vegetables. Vegetables in a cheese sauce. Regular canned vegetables (not low-sodium or reduced-sodium). Regular canned tomato sauce and paste (not low-sodium or reduced-sodium). Regular tomato and vegetable juice (not low-sodium or reduced-sodium). Pickles. Olives. Fruits Canned fruit in a light or heavy syrup. Fried fruit. Fruit in cream or butter sauce. Meat and other protein foods Fatty cuts of meat. Ribs. Fried meat. Bacon. Sausage. Bologna and other processed lunch meats. Salami. Fatback. Hotdogs. Bratwurst. Salted nuts and seeds. Canned beans with added salt. Canned or smoked fish. Whole eggs or egg yolks. Chicken or turkey with skin. Dairy Whole or 2% milk, cream, and half-and-half. Whole or full-fat cream cheese. Whole-fat or sweetened yogurt. Full-fat cheese. Nondairy creamers. Whipped toppings.  Processed cheese and cheese spreads. Fats and oils Butter. Stick margarine. Lard. Shortening. Ghee. Bacon fat. Tropical oils, such as coconut, palm kernel, or palm oil. Seasoning and other foods Salted popcorn and pretzels. Onion salt, garlic salt, seasoned salt, table salt, and sea salt. Worcestershire sauce. Tartar sauce. Barbecue sauce. Teriyaki sauce. Soy sauce, including reduced-sodium. Steak sauce. Canned and packaged gravies. Fish sauce. Oyster sauce. Cocktail sauce. Horseradish that you find on the shelf. Ketchup. Mustard. Meat flavorings and tenderizers. Bouillon cubes. Hot sauce and Tabasco sauce. Premade or packaged marinades. Premade or packaged taco seasonings. Relishes. Regular salad dressings. Where to find more information:  National Heart, Lung, and Blood Institute: www.nhlbi.nih.gov  American Heart Association: www.heart.org Summary  The DASH eating plan is a healthy eating plan that has been shown to reduce high blood pressure (hypertension). It may also reduce your risk for type 2 diabetes, heart disease, and stroke.  With the DASH eating plan, you should limit salt (sodium) intake to 2,300 mg a day. If you have hypertension, you may need to reduce your sodium intake to 1,500 mg a day.  When on the DASH eating plan, aim to eat more fresh fruits and vegetables, whole grains, lean proteins, low-fat dairy, and heart-healthy fats.  Work with your health care provider or diet and nutrition specialist (dietitian) to adjust your eating plan to your individual   calorie needs. This information is not intended to replace advice given to you by your health care provider. Make sure you discuss any questions you have with your health care provider. Document Released: 03/07/2011 Document Revised: 03/11/2016 Document Reviewed: 03/11/2016 Elsevier Interactive Patient Education  2018 Elsevier Inc.  

## 2018-05-05 ENCOUNTER — Other Ambulatory Visit: Payer: Self-pay | Admitting: Family Medicine

## 2018-05-05 MED ORDER — LOSARTAN POTASSIUM 100 MG PO TABS: 100.0000 mg | ORAL_TABLET | Freq: Every day | ORAL | 3 refills | Status: DC

## 2018-05-05 MED ORDER — HYDROCHLOROTHIAZIDE 25 MG PO TABS: 25.0000 mg | ORAL_TABLET | Freq: Every day | ORAL | 3 refills | Status: DC

## 2018-05-05 NOTE — Telephone Encounter (Signed)
Dr. Penelope Coop pt.  On pt's med list she is taking the combination losartan/HCTZ 100/25mg .   Please advise.  Thanks,   -Vernona Rieger

## 2018-05-05 NOTE — Telephone Encounter (Signed)
[facility] faxed refill request for the following medications:  1. Losartan Potassium 100 mg  2. Hydrochlorothiazide 25 mg  90 day supply  Please advise. Thanks TNP

## 2018-05-14 ENCOUNTER — Encounter: Payer: Medicare Other | Admitting: Obstetrics and Gynecology

## 2018-05-20 ENCOUNTER — Ambulatory Visit: Payer: Medicare Other | Admitting: Obstetrics and Gynecology

## 2018-05-20 ENCOUNTER — Encounter: Payer: Self-pay | Admitting: Obstetrics and Gynecology

## 2018-05-20 VITALS — BP 123/71 | HR 83 | Ht 64.0 in | Wt 105.0 lb

## 2018-05-20 DIAGNOSIS — N904 Leukoplakia of vulva: Secondary | ICD-10-CM | POA: Diagnosis not present

## 2018-05-20 NOTE — Progress Notes (Signed)
Pt is present today for follow up for lichen sclerosus. Pt stated that since her last visit 6 months ago that she has noticed some improvement in her symptoms.

## 2018-05-20 NOTE — Progress Notes (Signed)
    GYNECOLOGY PROGRESS NOTE  Subjective:    Patient ID: Susan Booth, female    DOB: 26-Nov-1930, 85 y.o.   MRN: [id number]  HPI  Patient is a 85 y.o. G60P2002 female who presents for 6 month follow up of lichen sclerosis.  She reports that she has continued to noticed some improvement in her symptoms.  Denies any recent issues with itching or burning. Is still using treatment (Temovate) twice daily. She does note that the rectal/perineal burning tends to come and go, however is not nearly as frequent.   The following portions of the patient's history were reviewed and updated as appropriate: allergies, current medications, past family history, past medical history, past social history, past surgical history and problem list.  Review of Systems Pertinent items noted in HPI and remainder of comprehensive ROS otherwise negative.   Objective:   Blood pressure 123/71, pulse 83, height 5\' 4"  (1.626 m), weight 105 lb (47.6 kg). General appearance: alert and no distress Abdomen: soft, non-tender; bowel sounds normal; no masses,  no organomegaly Pelvic: external genitalia with atrophic labia with mild agglutination of the labia minora to the labia majora; no evidence of leukoplakia, epithelial skin breakdown, or ulceration. Vagina with atrophic changes present; no discharge or lesions.  Bimanual-deferred. Rectovaginal-no significant chronic skin changes seen today (resolved); no hemorrhoids; no rectal discharge or bleeding   Assessment:   Lichen sclerosis  Plan:   Patient s/p 6 month treatment of Temovate. Advised that she can now decrease to use as needed as she has noted significant improvement in her symptoms.  To f/u in 6 months or sooner if symptoms reoccur. If remains asymptomatic, can then f/u at yearly intervals.   Hildred Laser, MD [facility]

## 2018-05-23 ENCOUNTER — Encounter: Payer: Self-pay | Admitting: Obstetrics and Gynecology

## 2018-06-30 ENCOUNTER — Other Ambulatory Visit: Payer: Self-pay | Admitting: Family Medicine

## 2018-06-30 DIAGNOSIS — E876 Hypokalemia: Secondary | ICD-10-CM

## 2018-06-30 MED ORDER — POTASSIUM CHLORIDE CRYS ER 20 MEQ PO TBCR: EXTENDED_RELEASE_TABLET | ORAL | 3 refills | Status: DC

## 2018-06-30 NOTE — Telephone Encounter (Signed)
[facility]  faxed refill request for the following medications:  potassium chloride SA (K-DUR,KLOR-CON) 20 MEQ tablet   Please advise.

## 2018-07-07 ENCOUNTER — Ambulatory Visit: Payer: Medicare Other | Admitting: Family Medicine

## 2018-09-28 NOTE — Progress Notes (Signed)
Patient: Susan Booth Female    DOB: 1930/12/14   85 y.o.   MRN: [id number] Visit Date: 09/29/2018  Today's Provider: Lavon Paganini, MD   Chief Complaint  Patient presents with   Hypertension   Subjective:    Virtual Visit via Telephone Note  I connected with Susan Booth on 09/29/18 at  9:40 AM EDT by telephone and verified that I am speaking with the correct person using two identifiers.   Patient location: home Provider location: [place] involved in the visit: patient, provider   I discussed the limitations, risks, security and privacy concerns of performing an evaluation and management service by telephone and the availability of in person appointments. I also discussed with the patient that there may be a patient responsible charge related to this service. The patient expressed understanding and agreed to proceed.  HPI  Hypertension, follow-up:  BP Readings from Last 3 Encounters:  05/20/18 123/71  01/05/18 128/62  11/24/17 (!) 154/78    She was last seen for hypertension 6 months ago.  BP at that visit was 128/62. Management changes since that visit include no change. She reports good compliance with treatment. She is not having side effects.  She is not exercising. She is adherent to low salt diet.   Outside blood pressures are being checked periodically. 6/15 - 135/70, yesterday 139/81 She is experiencing dizziness and fatigue.  Patient denies chest pain, chest pressure/discomfort, claudication, dyspnea, exertional chest pressure/discomfort, fatigue, irregular heart beat, lower extremity edema, near-syncope, orthopnea, palpitations, paroxysmal nocturnal dyspnea, syncope and tachypnea.   Cardiovascular risk factors include advanced age (older than 93 for men, 31 for women) and hypertension.  Use of agents associated with hypertension: none.    Did have some dizziness last week, but this has resolved.  She also  feels fatigued.  Cramps in legs, feet, toes and hands intermittently  Weight trend: stable Wt Readings from Last 3 Encounters:  05/20/18 105 lb (47.6 kg)  01/05/18 105 lb (47.6 kg)  11/24/17 102 lb 9.6 oz (46.5 kg)    ------------------------------------------------------------------------  No Known Allergies   Current Outpatient Medications:    amLODipine (NORVASC) 2.5 MG tablet, Take 1 tablet (2.5 mg total) by mouth daily., Disp: 90 tablet, Rfl: 3   amoxicillin (AMOXIL) 500 MG tablet, Take 500 mg by mouth. Take 1 tablet one hour prior to dental work., Disp: , Rfl:    aspirin EC 81 MG tablet, Take 81 mg by mouth daily., Disp: , Rfl:    Calcium Carbonate-Vitamin D 600-400 MG-UNIT per tablet, CALTRATE 600+D, 600-400MG -UNIT (Oral Tablet)  1 Every Day for 0 days  Quantity: 0.00;  Refills: 0   Ordered :12-Jan-2010  Susan Booth ;  Started 15-Aug-2008 Active Comments: DX: 733.90, Disp: , Rfl:    clobetasol cream (TEMOVATE) 0.05 %, APPLY ONE APPLICATION TOPICALLY TWO TIMES A WEEK, Disp: 30 g, Rfl: 1   fluticasone (FLONASE) 50 MCG/ACT nasal spray, Place 1 spray into both nostrils daily as needed., Disp: 48 g, Rfl: 3   hydrochlorothiazide (HYDRODIURIL) 25 MG tablet, Take 1 tablet (25 mg total) by mouth daily., Disp: 90 tablet, Rfl: 3   LORazepam (ATIVAN) 0.5 MG tablet, TAKE 1 TABLET BY MOUTH TWICE DAILY AND THEN 1 EVERY 6 HOURS AS NEEDED, Disp: 120 tablet, Rfl: 5   losartan (COZAAR) 100 MG tablet, Take 1 tablet (100 mg total) by mouth daily., Disp: 90 tablet, Rfl: 3   potassium chloride SA (K-DUR,KLOR-CON) 20 MEQ  tablet, TAKE ONE (1) TABLET BY MOUTH TWO (2) TIMES DAILY, Disp: 180 tablet, Rfl: 3  Review of Systems  Constitutional: Positive for fatigue.  Respiratory: Negative.   Cardiovascular: Negative.   Musculoskeletal: Negative.   Neurological: Positive for dizziness.    Social History   Tobacco Use   Smoking status: Never Smoker   Smokeless tobacco: Never Used    Substance Use Topics   Alcohol use: No      Objective:   There were no vitals taken for this visit. There were no vitals filed for this visit.   Physical Exam   No results found for any visits on 09/29/18.     Assessment & Plan    I discussed the assessment and treatment plan with the patient. The patient was provided an opportunity to ask questions and all were answered. The patient agreed with the plan and demonstrated an understanding of the instructions.   The patient was advised to call back or seek an in-person evaluation if the symptoms worsen or if the condition fails to improve as anticipated.  Problem List Items Addressed This Visit      Cardiovascular and Mediastinum   Benign hypertension - Primary    Well-controlled on home readings She can continue HCTZ, losartan, and amlodipine at current doses Plan to recheck metabolic panel at next in-person visit        Respiratory   Allergic rhinitis    Recent intermittent dry cough and postnasal drip likely related to allergic rhinitis as this has been longstanding for several months Advised to take Flonase more frequently Can also use OTC second-generation, nondrowsy antihistamine        Other   Leg cramps    Intermittent cramping Could be related to hypokalemia or HCTZ Plan to recheck CMP, TSH, and Magnesium at next visit Advised that she can take low-dose OTC magnesium twice daily, tonic water, or use mustard as needed cramping Advised stretching before bed          Return in about 3 months (around 12/30/2018) for CPE/AWV.   The entirety of the information documented in the History of Present Illness, Review of Systems and Physical Exam were personally obtained by me. Portions of this information were initially documented by Susan Booth, CMA and reviewed by me for thoroughness and accuracy.    Susan Booth, Marzella SchleinAngela M, MD MPH [facility]

## 2018-09-29 ENCOUNTER — Encounter: Payer: Self-pay | Admitting: Family Medicine

## 2018-09-29 ENCOUNTER — Ambulatory Visit (INDEPENDENT_AMBULATORY_CARE_PROVIDER_SITE_OTHER): Payer: Medicare Other | Admitting: Family Medicine

## 2018-09-29 ENCOUNTER — Other Ambulatory Visit: Payer: Self-pay | Admitting: Family Medicine

## 2018-09-29 VITALS — BP 138/87

## 2018-09-29 DIAGNOSIS — I10 Essential (primary) hypertension: Secondary | ICD-10-CM

## 2018-09-29 DIAGNOSIS — J301 Allergic rhinitis due to pollen: Secondary | ICD-10-CM | POA: Diagnosis not present

## 2018-09-29 DIAGNOSIS — R252 Cramp and spasm: Secondary | ICD-10-CM | POA: Insufficient documentation

## 2018-09-29 DIAGNOSIS — F419 Anxiety disorder, unspecified: Secondary | ICD-10-CM

## 2018-09-29 NOTE — Telephone Encounter (Signed)
[facility] faxed refill request for the following medications:  LORazepam (ATIVAN) 0.5 MG tablet   Please advise.

## 2018-09-29 NOTE — Assessment & Plan Note (Signed)
Well-controlled on home readings She can continue HCTZ, losartan, and amlodipine at current doses Plan to recheck metabolic panel at next in-person visit

## 2018-09-29 NOTE — Assessment & Plan Note (Signed)
Recent intermittent dry cough and postnasal drip likely related to allergic rhinitis as this has been longstanding for several months Advised to take Flonase more frequently Can also use OTC second-generation, nondrowsy antihistamine

## 2018-09-29 NOTE — Assessment & Plan Note (Signed)
Intermittent cramping Could be related to hypokalemia or HCTZ Plan to recheck CMP, TSH, and Magnesium at next visit Advised that she can take low-dose OTC magnesium twice daily, tonic water, or use mustard as needed cramping Advised stretching before bed

## 2018-09-30 MED ORDER — LORAZEPAM 0.5 MG PO TABS: ORAL_TABLET | ORAL | 3 refills | Status: DC

## 2018-11-05 ENCOUNTER — Ambulatory Visit: Payer: Medicare Other

## 2018-11-18 ENCOUNTER — Other Ambulatory Visit: Payer: Self-pay

## 2018-11-18 ENCOUNTER — Ambulatory Visit: Payer: Medicare Other | Admitting: Obstetrics and Gynecology

## 2018-11-18 ENCOUNTER — Encounter: Payer: Self-pay | Admitting: Obstetrics and Gynecology

## 2018-11-18 VITALS — BP 129/78 | HR 79 | Ht 64.0 in | Wt 104.2 lb

## 2018-11-18 DIAGNOSIS — N904 Leukoplakia of vulva: Secondary | ICD-10-CM | POA: Diagnosis not present

## 2018-11-18 NOTE — Progress Notes (Signed)
    GYNECOLOGY PROGRESS NOTE  Subjective:    Patient ID: Susan Booth, female    DOB: 16-Sep-1930, 85 y.o.   MRN: [id number]  HPI  Patient is a 85 y.o. G46P2002 female who presents for 6 month follow up of lichen sclerosis.  She reports that she has continued to noticed great improvement in her symptoms.  Denies any recent issues with itching or burning, or is mild enough that she doesn't require use of the cream. Is using treatment (Temovate) as needed.   The following portions of the patient's history were reviewed and updated as appropriate: allergies, current medications, past family history, past medical history, past social history, past surgical history and problem list.  Review of Systems Pertinent items noted in HPI and remainder of comprehensive ROS otherwise negative.   Objective:   Blood pressure 129/78, pulse 79, height 5\' 4"  (1.626 m), weight 104 lb 3.2 oz (47.3 kg). General appearance: alert and no distress Abdomen: soft, non-tender; bowel sounds normal; no masses,  no organomegaly Pelvic: external genitalia with atrophic labia with scant area of leukoplakia at clitoral hood. No evidence of epithelial skin breakdown, or ulceration. Vagina with mild atrophic changes present; no discharge or lesions.  Bimanual-deferred. Rectovaginal-no significant chronic skin changes seen today; no hemorrhoids; no rectal discharge or bleeding   Assessment:   Lichen sclerosis  Plan:   Patient can continue use of Temovate as needed.  To f/u in 1 year or sooner if symptoms reoccur.    Rubie Maid, MD [facility]

## 2018-11-18 NOTE — Progress Notes (Signed)
Pt is present for a 6 month follow up for lichen sclerosus issues. Pt stated that she has noticed over the months improvement in her symptoms.

## 2019-01-06 NOTE — Progress Notes (Signed)
Subjective:   Susan Booth is a 85 y.o. female who presents for Medicare Annual (Subsequent) preventive examination.  Review of Systems:  N/A  Cardiac Risk Factors include: advanced age (>54men, >93 women);hypertension     Objective:     Vitals: BP 118/60 (BP Location: Right Arm)   Pulse 78   Temp 98.3 F (36.8 C) (Oral)   Ht  (1.626 m)   Wt 104 lb 6.4 oz (47.4 kg)   BMI 17.92 kg/m   Body mass index is 17.92 kg/m.  Advanced Directives 01/07/2019 11/03/2017 11/18/2016 08/16/2016 11/24/2015 11/22/2015 11/16/2015  Does Patient Have a Medical Advance Directive? No No No No No No No  Would patient like information on creating a medical advance directive? No - Patient declined No - Patient declined - - No - patient declined information - No - patient declined information    Tobacco Social History   Tobacco Use  Smoking Status Never Smoker  Smokeless Tobacco Never Used     Counseling given: Not Answered   Clinical Intake:  Pre-visit preparation completed: Yes  Pain : No/denies pain Pain Score: 0-No pain     Nutritional Status: BMI <19  Underweight Nutritional Risks: None Diabetes: No  How often do you need to have someone help you when you read instructions, pamphlets, or other written materials from your doctor or pharmacy?: 1 - Never  Interpreter Needed?: No  Information entered by :: [facility], LPN  Past Medical History:  Diagnosis Date  . Anxiety   . Benign essential tremor   . Complication of anesthesia    nausea  . Gastroesophageal reflux disease   . Hypertension   . Lichen sclerosus et atrophicus of the vulva   . Murmur   . Need for SBE (subacute bacterial endocarditis) prophylaxis   . Osteoporosis, post-menopausal   . Other chest pain   . Paresthesia   . Rhinitis, allergic    Past Surgical History:  Procedure Laterality Date  . ABDOMINAL HYSTERECTOMY  1968  . ASD REPAIR, SECUNDUM  03/2007  . CATARACT EXTRACTION Bilateral 06/2012  .  CORRECTION HAMMER TOE Bilateral 2003  . hole in heart repaired     continue to have some leakage  . ORIF PATELLA Left 11/24/2015   Procedure: OPEN REDUCTION INTERNAL (ORIF) FIXATION PATELLA;  Surgeon: Juanell Fairly, MD;  Location: [facility] ORS;  Service: Orthopedics;  Laterality: Left;  (PER Dr Rico Ala Spinal would be ideal    Family History  Problem Relation Age of Onset  . COPD Mother   . Early death Mother   . Lung cancer Mother   . Heart disease Father   . Heart attack Father   . Heart disease Sister        heart valve problem  . Breast cancer Maternal Aunt 70  . Prostate cancer Son   . Prostate cancer Son    Social History   Socioeconomic History  . Marital status: Married    Spouse name: Sherrine Maples  . Number of children: 2  . Years of education: Some College  . Highest education level: Some college, no degree  Occupational History  . Occupation: Retired  Engineer, production  . Financial resource strain: Not hard at all  . Food insecurity    Worry: Never true    Inability: Never true  . Transportation needs    Medical: No    Non-medical: No  Tobacco Use  . Smoking status: Never Smoker  . Smokeless tobacco: Never Used  Substance  and Sexual Activity  . Alcohol use: Yes    Comment: rare - 1 glass of wine  . Drug use: No  . Sexual activity: Never  Lifestyle  . Physical activity    Days per week: 0 days    Minutes per session: 0 min  . Stress: Rather much  Relationships  . Social Musicianconnections    Talks on phone: Patient refused    Gets together: Patient refused    Attends religious service: Patient refused    Active member of club or organization: Patient refused    Attends meetings of clubs or organizations: Patient refused    Relationship status: Patient refused  Other Topics Concern  . Not on file  Social History Narrative  . Not on file    Outpatient Encounter Medications as of 01/07/2019  Medication Sig  . amLODipine (NORVASC) 2.5 MG tablet Take 1 tablet (2.5  mg total) by mouth daily.  [name]. amoxicillin (AMOXIL) 500 MG tablet Take 500 mg by mouth. Take 1 tablet one hour prior to dental work.  [name]. aspirin EC 81 MG tablet Take 81 mg by mouth daily.  . Calcium Carbonate-Vitamin D 600-400 MG-UNIT per tablet CALTRATE 600+D, 600-400MG -UNIT (Oral Tablet)  1 Every Day for 0 days  Quantity: 0.00;  Refills: 0   Ordered :12-Jan-2010  Merleen NicelyHaith, Tammy ;  Started 15-Aug-2008 Active Comments: DX: 733.90  . clobetasol cream (TEMOVATE) 0.05 % APPLY ONE APPLICATION TOPICALLY TWO TIMES A WEEK (Patient taking differently: as needed. )  . fluticasone (FLONASE) 50 MCG/ACT nasal spray Place 1 spray into both nostrils daily as needed.  . hydrochlorothiazide (HYDRODIURIL) 25 MG tablet Take 1 tablet (25 mg total) by mouth daily.  [name]. LORazepam (ATIVAN) 0.5 MG tablet TAKE 1 TABLET BY MOUTH TWICE DAILY AND THEN 1 EVERY 6 HOURS AS NEEDED  . losartan (COZAAR) 100 MG tablet Take 1 tablet (100 mg total) by mouth daily.  . potassium chloride SA (K-DUR,KLOR-CON) 20 MEQ tablet TAKE ONE (1) TABLET BY MOUTH TWO (2) TIMES DAILY   No facility-administered encounter medications on file as of 01/07/2019.     Activities of Daily Living In your present state of health, do you have any difficulty performing the following activities: 01/07/2019  Hearing? Y  Comment Wears bilateral hearing aids.  Vision? N  Difficulty concentrating or making decisions? N  Walking or climbing stairs? N  Dressing or bathing? N  Doing errands, shopping? N  Preparing Food and eating ? N  Using the Toilet? N  In the past six months, have you accidently leaked urine? Y  Comment Occasionally with urges or pressure.  Do you have problems with loss of bowel control? Y  Comment Occasionally with urges or pressure.  Managing your Medications? N  Managing your Finances? N  Housekeeping or managing your Housekeeping? N  Some recent data might be hidden    Patient Care Team: Erasmo DownerBacigalupo, Angela M, MD as PCP - General (Family  Medicine) Dingeldein, Viviann SpareSteven, MD as Consulting Physician (Ophthalmology) Lady GaryFath, Darlin PriestlyKenneth A, MD as Consulting Physician (Cardiology) Hildred Laserherry, Anika, MD as Referring Physician (Obstetrics and Gynecology)    Assessment:   This is a routine wellness examination for [place].  Exercise Activities and Dietary recommendations Current Exercise Habits: The patient does not participate in regular exercise at present, Exercise limited by: None identified  Goals    . DIET - INCREASE WATER INTAKE     Recommend increasing water intake to 4-6 glasses a day.  Fall Risk: Fall Risk  01/07/2019 11/03/2017 11/18/2016 09/11/2015 09/09/2014  Falls in the past year? 0 Yes No No No  Number falls in past yr: 0 1 - - -  Injury with Fall? 0 Yes - - -  Comment - sprained ankle - - -  Follow up - Falls prevention discussed - - -    FALL RISK PREVENTION PERTAINING TO THE HOME:  Any stairs in or around the home? Yes  If so, are there any without handrails? No   Home free of loose throw rugs in walkways, pet beds, electrical cords, etc? Yes  Adequate lighting in your home to reduce risk of falls? Yes   ASSISTIVE DEVICES UTILIZED TO PREVENT FALLS:  Life alert? No  Use of a cane, walker or w/c? No  Grab bars in the bathroom? Yes  Shower chair or bench in shower? Yes  Elevated toilet seat or a handicapped toilet? No    TIMED UP AND GO:  Was the test performed? No .    Depression Screen PHQ 2/9 Scores 01/07/2019 11/03/2017 11/18/2016 09/11/2015  PHQ - 2 Score 0 0 0 0     Cognitive Function: Declined today.      6CIT Screen 11/03/2017  What Year? 0 points  What month? 0 points  What time? 0 points  Count back from 20 0 points  Months in reverse 0 points  Repeat phrase 2 points  Total Score 2    Immunization History  Administered Date(s) Administered  . Influenza, High Dose Seasonal PF 01/05/2014  . Influenza,inj,quad, With Preservative 12/29/2017  . Influenza-Unspecified 12/29/2016, 12/31/2017   . Pneumococcal Conjugate-13 09/06/2013  . Pneumococcal Polysaccharide-23 02/08/2004  . Td 07/12/2003  . Zoster 09/29/2010  . Zoster Recombinat (Shingrix) 02/24/2018, 05/15/2018    Qualifies for Shingles Vaccine? Completed series  Tdap: Although this vaccine is not a covered service during a Wellness Exam, does the patient still wish to receive this vaccine today?  No .   Flu Vaccine: Due for Flu vaccine. Does the patient want to receive this vaccine today?  Yes .   Pneumococcal Vaccine: Completed series  Screening Tests Health Maintenance  Topic Date Due  . DEXA SCAN  01/07/2020 (Originally 09/28/2015)  . TETANUS/TDAP  01/01/2023 (Originally 07/11/2013)  . INFLUENZA VACCINE  Completed  . PNA vac Low Risk Adult  Completed    Cancer Screenings:  Colorectal Screening: No longer required.   Mammogram: No longer required.   Bone Density: Completed 09/27/13. Results reflect OSTEOPOROSIS. Repeat every 2 years. Pt declined a DEXA order today.  Lung Cancer Screening: (Low Dose CT Chest recommended if Age 56-80 years, 30 pack-year currently smoking OR have quit w/in 15years.) does not qualify.   Additional Screening:  Vision Screening: Recommended annual ophthalmology exams for early detection of glaucoma and other disorders of the eye.  Dental Screening: Recommended annual dental exams for proper oral hygiene  Community Resource Referral:  CRR required this visit?  No       Plan:  I have personally reviewed and addressed the Medicare Annual Wellness questionnaire and have noted the following in the patient's chart:  A. Medical and social history B. Use of alcohol, tobacco or illicit drugs  C. Current medications and supplements D. Functional ability and status E.  Nutritional status F.  Physical activity G. Advance directives H. List of other physicians I.  Hospitalizations, surgeries, and ER visits in previous 12 months J.  [place] such as hearing and  vision if  needed, cognitive and depression L. Referrals and appointments   In addition, I have reviewed and discussed with patient certain preventive protocols, quality metrics, and best practice recommendations. A written personalized care plan for preventive services as well as general preventive health recommendations were provided to patient. Nurse Health Advisor  Signed,    Rene Gonsoulin [place], [place]  [id number] Nurse Health Advisor   Nurse Notes: Declined a DEXA scan.

## 2019-01-07 ENCOUNTER — Ambulatory Visit (INDEPENDENT_AMBULATORY_CARE_PROVIDER_SITE_OTHER): Payer: Medicare Other

## 2019-01-07 ENCOUNTER — Encounter: Payer: Self-pay | Admitting: Family Medicine

## 2019-01-07 ENCOUNTER — Ambulatory Visit (INDEPENDENT_AMBULATORY_CARE_PROVIDER_SITE_OTHER): Payer: Medicare Other | Admitting: Family Medicine

## 2019-01-07 ENCOUNTER — Other Ambulatory Visit: Payer: Self-pay

## 2019-01-07 VITALS — BP 118/60 | HR 78 | Temp 98.3°F | Ht 64.0 in | Wt 104.4 lb

## 2019-01-07 DIAGNOSIS — I10 Essential (primary) hypertension: Secondary | ICD-10-CM

## 2019-01-07 DIAGNOSIS — R739 Hyperglycemia, unspecified: Secondary | ICD-10-CM | POA: Diagnosis not present

## 2019-01-07 DIAGNOSIS — R636 Underweight: Secondary | ICD-10-CM

## 2019-01-07 DIAGNOSIS — Z23 Encounter for immunization: Secondary | ICD-10-CM

## 2019-01-07 DIAGNOSIS — Z0000 Encounter for general adult medical examination without abnormal findings: Secondary | ICD-10-CM

## 2019-01-07 NOTE — Progress Notes (Signed)
Patient: Susan Booth, Female    DOB: [birth year], 85 y.o.   MRN: [id number] Visit Date: 01/07/2019  Today's Provider: Shirlee Latch, MD   Chief Complaint  Patient presents with  . Annual Exam   Subjective:    I, Susan Booth CMA, am acting as a scribe for Shirlee Latch, MD.    Patient had a AWE with [facility] prior to appointment   Annual physical exam Susan Booth is a 85 y.o. female who presents today for health maintenance and complete physical. She feels well. She reports exercising includes house and yard work. She reports she is sleeping fairly well.  -----------------------------------------------------------------   Review of Systems  Constitutional: Negative.   HENT: Negative.   Eyes: Negative.   Respiratory: Negative.   Cardiovascular: Negative.   Gastrointestinal: Negative.   Endocrine: Positive for cold intolerance.  Genitourinary: Negative.   Musculoskeletal: Negative.   Skin: Negative.   Allergic/Immunologic: Negative.   Neurological: Negative.   Hematological: Negative.   Psychiatric/Behavioral: Negative.     Social History      She  reports that she has never smoked. She has never used smokeless tobacco. She reports current alcohol use. She reports that she does not use drugs.       Social History   Socioeconomic History  . Marital status: Married    Spouse name: Susan Booth  . Number of children: 2  . Years of education: Some College  . Highest education level: Some college, no degree  Occupational History  . Occupation: Retired  Engineer, production  . Financial resource strain: Not hard at all  . Food insecurity    Worry: Never true    Inability: Never true  . Transportation needs    Medical: No    Non-medical: No  Tobacco Use  . Smoking status: Never Smoker  . Smokeless tobacco: Never Used  Substance and Sexual Activity  . Alcohol use: Yes    Comment: rare - 1 glass of wine  . Drug use: No  . Sexual activity:  Never  Lifestyle  . Physical activity    Days per week: 0 days    Minutes per session: 0 min  . Stress: Rather much  Relationships  . Social Musician on phone: Patient refused    Gets together: Patient refused    Attends religious service: Patient refused    Active member of club or organization: Patient refused    Attends meetings of clubs or organizations: Patient refused    Relationship status: Patient refused  Other Topics Concern  . Not on file  Social History Narrative  . Not on file    Past Medical History:  Diagnosis Date  . Anxiety   . Benign essential tremor   . Complication of anesthesia    nausea  . Gastroesophageal reflux disease   . Hypertension   . Lichen sclerosus et atrophicus of the vulva   . Murmur   . Need for SBE (subacute bacterial endocarditis) prophylaxis   . Osteoporosis, post-menopausal   . Other chest pain   . Paresthesia   . Rhinitis, allergic      Patient Active Problem List   Diagnosis Date Noted  . Leg cramps 09/29/2018  . Jittery 11/24/2017  . Hyperglycemia 11/24/2017  . Rectal burning 11/11/2017  . Constipation 11/11/2017  . Fatigue 04/29/2016  . Patella fracture 11/24/2015  . Rectal itching 07/04/2015  . Heart palpitations 11/25/2014  . Lichen sclerosus et atrophicus  of the vulva   . Atypical chest pain 08/26/2014  . Paresthesia 08/26/2014  . Allergic rhinitis 07/22/2014  . Anxiety 07/22/2014  . Benign essential tremor 07/22/2014  . Acid reflux 07/22/2014  . OP (osteoporosis) 07/22/2014  . Bone/cartilage disorder 08/19/2008  . Nonrheumatic tricuspid valve disorder 08/19/2008  . Colon, diverticulosis 08/15/2008  . Benign hypertension 08/15/2008  . Primary pulmonary hypertension ([place]) 08/15/2008  . Heart murmur 08/15/2008  . H/O total hysterectomy 04/02/1967    Past Surgical History:  Procedure Laterality Date  . ABDOMINAL HYSTERECTOMY  1968  . ASD REPAIR, SECUNDUM  03/2007  . CATARACT EXTRACTION  Bilateral 06/2012  . CORRECTION HAMMER TOE Bilateral 2003  . hole in heart repaired     continue to have some leakage  . ORIF PATELLA Left 11/24/2015   Procedure: OPEN REDUCTION INTERNAL (ORIF) FIXATION PATELLA;  Surgeon: Susan Park, MD;  Location: [facility] ORS;  Service: Orthopedics;  Laterality: Left;  (PER Dr Tanja Port Spinal would be ideal     Family History        Family Status  Relation Name Status  . Mother  Deceased at age 2  . Father  Deceased at age 51  . Sister  Alive  . Mat Aunt  Deceased  . Son  Alive  . Son  Alive        Her family history includes Breast cancer (age of onset: 54) in her maternal aunt; COPD in her mother; Early death in her mother; Heart attack in her father; Heart disease in her father and sister; Lung cancer in her mother; Prostate cancer in her son and son.      No Known Allergies   Current Outpatient Medications:  .  amLODipine (NORVASC) 2.5 MG tablet, Take 1 tablet (2.5 mg total) by mouth daily., Disp: 90 tablet, Rfl: 3 .  amoxicillin (AMOXIL) 500 MG tablet, Take 500 mg by mouth. Take 1 tablet one hour prior to dental work., Disp: , Rfl:  .  aspirin EC 81 MG tablet, Take 81 mg by mouth daily., Disp: , Rfl:  .  Calcium Carbonate-Vitamin D 600-400 MG-UNIT per tablet, CALTRATE 600+D, 600-400MG -UNIT (Oral Tablet)  1 Every Day for 0 days  Quantity: 0.00;  Refills: 0   Ordered :12-Jan-2010  Susan Booth ;  Started 15-Aug-2008 Active Comments: DX: 733.90, Disp: , Rfl:  .  clobetasol cream (TEMOVATE) 0.05 %, APPLY ONE APPLICATION TOPICALLY TWO TIMES A WEEK (Patient taking differently: as needed. ), Disp: 30 g, Rfl: 1 .  fluticasone (FLONASE) 50 MCG/ACT nasal spray, Place 1 spray into both nostrils daily as needed., Disp: 48 g, Rfl: 3 .  hydrochlorothiazide (HYDRODIURIL) 25 MG tablet, Take 1 tablet (25 mg total) by mouth daily., Disp: 90 tablet, Rfl: 3 .  LORazepam (ATIVAN) 0.5 MG tablet, TAKE 1 TABLET BY MOUTH TWICE DAILY AND THEN 1 EVERY 6 HOURS AS  NEEDED, Disp: 60 tablet, Rfl: 3 .  losartan (COZAAR) 100 MG tablet, Take 1 tablet (100 mg total) by mouth daily., Disp: 90 tablet, Rfl: 3 .  potassium chloride SA (K-DUR,KLOR-CON) 20 MEQ tablet, TAKE ONE (1) TABLET BY MOUTH TWO (2) TIMES DAILY, Disp: 180 tablet, Rfl: 3   Patient Care Team: Susan Crews, MD as PCP - General (Family Medicine) Dingeldein, Remo Lipps, MD as Consulting Physician (Ophthalmology) Ubaldo Glassing Javier Docker, MD as Consulting Physician (Cardiology) Rubie Maid, MD as Referring Physician (Obstetrics and Gynecology)    Objective:    Vitals: BP 118/60 (BP Location: Right Arm, Patient Position: Sitting,  Cuff Size: Normal)   Pulse 78   Temp 98.3 F (36.8 C) (Oral)   Ht 5\' 4"  (1.626 m)   Wt 104 lb 6.4 oz (47.4 kg)   BMI 17.92 kg/m    Vitals:   01/07/19 1021  BP: 118/60  Pulse: 78  Temp: 98.3 F (36.8 C)  TempSrc: Oral  Weight: 104 lb 6.4 oz (47.4 kg)  Height: 5\' 4"  (1.626 m)     Physical Exam Vitals signs reviewed.  Constitutional:      General: She is not in acute distress.    Appearance: Normal appearance. She is well-developed. She is not diaphoretic.  HENT:     Head: Normocephalic and atraumatic.     Right Ear: External ear normal.     Left Ear: External ear normal.  Eyes:     General: No scleral icterus.    Conjunctiva/sclera: Conjunctivae normal.     Pupils: Pupils are equal, round, and reactive to light.  Neck:     Musculoskeletal: Neck supple.     Thyroid: No thyromegaly.  Cardiovascular:     Rate and Rhythm: Normal rate and regular rhythm.     Pulses: Normal pulses.     Heart sounds: Normal heart sounds. No murmur.  Pulmonary:     Effort: Pulmonary effort is normal. No respiratory distress.     Breath sounds: Normal breath sounds. No wheezing or rales.  Abdominal:     General: There is no distension.     Palpations: Abdomen is soft.     Tenderness: There is no abdominal tenderness.  Musculoskeletal:        General: No deformity.      Right lower leg: No edema.     Left lower leg: No edema.  Lymphadenopathy:     Cervical: No cervical adenopathy.  Skin:    General: Skin is warm and dry.     Capillary Refill: Capillary refill takes less than 2 seconds.     Findings: No rash.  Neurological:     Mental Status: She is alert and oriented to person, place, and time. Mental status is at baseline.  Psychiatric:        Mood and Affect: Mood normal.        Behavior: Behavior normal.        Thought Content: Thought content normal.      Depression Screen PHQ 2/9 Scores 01/07/2019 11/03/2017 11/18/2016 09/11/2015  PHQ - 2 Score 0 0 0 0       Assessment & Plan:     Routine Health Maintenance and Physical Exam  Exercise Activities and Dietary recommendations Goals    . DIET - INCREASE WATER INTAKE     Recommend increasing water intake to 4-6 glasses a day.        Immunization History  Administered Date(s) Administered  . Fluad Quad(high Dose 65+) 01/07/2019  . Influenza, High Dose Seasonal PF 01/05/2014  . Influenza,inj,quad, With Preservative 12/29/2017  . Influenza-Unspecified 12/29/2016, 12/31/2017  . Pneumococcal Conjugate-13 09/06/2013  . Pneumococcal Polysaccharide-23 02/08/2004  . Td 07/12/2003  . Zoster 09/29/2010  . Zoster Recombinat (Shingrix) 02/24/2018, 05/15/2018    Health Maintenance  Topic Date Due  . DEXA SCAN  01/07/2020 (Originally 09/28/2015)  . TETANUS/TDAP  01/01/2023 (Originally 07/11/2013)  . INFLUENZA VACCINE  Completed  . PNA vac Low Risk Adult  Completed     Discussed health benefits of physical activity, and encouraged her to engage in regular exercise appropriate for her age and condition.    --------------------------------------------------------------------  Problem List Items Addressed This Visit      Cardiovascular and Mediastinum   Benign hypertension    Well controlled Continue current medications Recheck metabolic panel F/u in 6 months       Relevant Orders    Lipid panel   Comprehensive metabolic panel     Other   Hyperglycemia    Recheck A1c      Relevant Orders   Hemoglobin A1c   Underweight    Encouraged high-protein diet Discussed importance of normal weight      Relevant Orders   CBC    Other Visit Diagnoses    Encounter for annual physical exam    -  Primary   Relevant Orders   Lipid panel   Comprehensive metabolic panel   CBC   Hemoglobin A1c       Return in about 6 months (around 07/08/2019) for chronic disease f/u.   The entirety of the information documented in the History of Present Illness, Review of Systems and Physical Exam were personally obtained by me. Portions of this information were initially documented by [facility], CMA and reviewed by me for thoroughness and accuracy.    Bacigalupo, Marzella SchleinAngela M, MD MPH [facility]

## 2019-01-07 NOTE — Assessment & Plan Note (Signed)
Well controlled Continue current medications Recheck metabolic panel F/u in 6 months  

## 2019-01-07 NOTE — Progress Notes (Deleted)
Patient: Susan Booth, Female    DOB: 05/08/1930, 85 y.o.   MRN: [id number] Visit Date: 01/07/2019  Today's Provider: Shirlee LatchAngela Bacigalupo, MD   No chief complaint on file.  Subjective:   Patient had a AWE with [facility] prior to appointment.    Annual wellness visit Susan Booth is a 85 y.o. female who presents today for her Subsequent Annual Wellness Visit. She feels {DESC; WELL/FAIRLY WELL/POORLY:18703}. She reports exercising ***. She reports she is sleeping {DESC; WELL/FAIRLY WELL/POORLY:18703}.  -----------------------------------------------------------   Review of Systems  Constitutional: Negative.   HENT: Negative.   Eyes: Negative.   Respiratory: Negative.   Cardiovascular: Negative.   Gastrointestinal: Negative.   Endocrine: Negative.   Genitourinary: Negative.   Musculoskeletal: Negative.   Skin: Negative.   Allergic/Immunologic: Negative.   Neurological: Negative.   Hematological: Negative.   Psychiatric/Behavioral: Negative.     Social History   Socioeconomic History  . Marital status: Married    Spouse name: Susan Booth  . Number of children: 2  . Years of education: Some College  . Highest education level: Some college, no degree  Occupational History  . Occupation: Retired  Engineer, productionocial Needs  . Financial resource strain: Not hard at all  . Food insecurity    Worry: Never true    Inability: Never true  . Transportation needs    Medical: No    Non-medical: No  Tobacco Use  . Smoking status: Never Smoker  . Smokeless tobacco: Never Used  Substance and Sexual Activity  . Alcohol use: No  . Drug use: No  . Sexual activity: Never  Lifestyle  . Physical activity    Days per week: Not on file    Minutes per session: Not on file  . Stress: Only a little  Relationships  . Social Musicianconnections    Talks on phone: Not on file    Gets together: Not on file    Attends religious service: Not on file    Active member of club or organization: Not on file     Attends meetings of clubs or organizations: Not on file    Relationship status: Not on file  . Intimate partner violence    Fear of current or ex partner: Not on file    Emotionally abused: Not on file    Physically abused: Not on file    Forced sexual activity: Not on file  Other Topics Concern  . Not on file  Social History Narrative  . Not on file    Patient Active Problem List   Diagnosis Date Noted  . Leg cramps 09/29/2018  . Jittery 11/24/2017  . Hyperglycemia 11/24/2017  . Rectal burning 11/11/2017  . Constipation 11/11/2017  . Fatigue 04/29/2016  . Patella fracture 11/24/2015  . Rectal itching 07/04/2015  . Heart palpitations 11/25/2014  . Lichen sclerosus et atrophicus of the vulva   . Atypical chest pain 08/26/2014  . Paresthesia 08/26/2014  . Allergic rhinitis 07/22/2014  . Anxiety 07/22/2014  . Benign essential tremor 07/22/2014  . Acid reflux 07/22/2014  . OP (osteoporosis) 07/22/2014  . Bone/cartilage disorder 08/19/2008  . Nonrheumatic tricuspid valve disorder 08/19/2008  . Colon, diverticulosis 08/15/2008  . Benign hypertension 08/15/2008  . Primary pulmonary hypertension (HCC) 08/15/2008  . Heart murmur 08/15/2008  . H/O total hysterectomy 04/02/1967    Past Surgical History:  Procedure Laterality Date  . ABDOMINAL HYSTERECTOMY  1968  . ASD REPAIR, SECUNDUM  03/2007  . CATARACT EXTRACTION Bilateral 06/2012  . CORRECTION HAMMER  TOE Bilateral 2003  . hole in heart repaired     continue to have some leakage  . ORIF PATELLA Left 11/24/2015   Procedure: OPEN REDUCTION INTERNAL (ORIF) FIXATION PATELLA;  Surgeon: Thornton Park, MD;  Location: [facility] ORS;  Service: Orthopedics;  Laterality: Left;  (PER Dr Tanja Port Spinal would be ideal     Her family history includes Breast cancer (age of onset: 64) in her maternal aunt; COPD in her mother; Early death in her mother; Heart attack in her father; Heart disease in her father and sister; Lung cancer in  her mother; Prostate cancer in her son and son.     Previous Medications   AMLODIPINE (NORVASC) 2.5 MG TABLET    Take 1 tablet (2.5 mg total) by mouth daily.   AMOXICILLIN (AMOXIL) 500 MG TABLET    Take 500 mg by mouth. Take 1 tablet one hour prior to dental work.   ASPIRIN EC 81 MG TABLET    Take 81 mg by mouth daily.   CALCIUM CARBONATE-VITAMIN D 600-400 MG-UNIT PER TABLET    CALTRATE 600+D, 600-400MG -UNIT (Oral Tablet)  1 Every Day for 0 days  Quantity: 0.00;  Refills: 0   Ordered :12-Jan-2010  Abran Richard ;  Started 15-Aug-2008 Active Comments: DX: 733.90   CLOBETASOL CREAM (TEMOVATE) 0.05 %    APPLY ONE APPLICATION TOPICALLY TWO TIMES A WEEK   FLUTICASONE (FLONASE) 50 MCG/ACT NASAL SPRAY    Place 1 spray into both nostrils daily as needed.   HYDROCHLOROTHIAZIDE (HYDRODIURIL) 25 MG TABLET    Take 1 tablet (25 mg total) by mouth daily.   LORAZEPAM (ATIVAN) 0.5 MG TABLET    TAKE 1 TABLET BY MOUTH TWICE DAILY AND THEN 1 EVERY 6 HOURS AS NEEDED   LOSARTAN (COZAAR) 100 MG TABLET    Take 1 tablet (100 mg total) by mouth daily.   POTASSIUM CHLORIDE SA (K-DUR,KLOR-CON) 20 MEQ TABLET    TAKE ONE (1) TABLET BY MOUTH TWO (2) TIMES DAILY    Patient Care Team: Virginia Crews, MD as PCP - General (Family Medicine) Defrancesco, Alanda Slim, MD as Consulting Physician (Obstetrics and Gynecology) Estill Cotta, MD as Consulting Physician (Ophthalmology) Ubaldo Glassing Javier Docker, MD as Consulting Physician (Cardiology)      Objective:   Vitals: There were no vitals taken for this visit.  Physical Exam  Activities of Daily Living No flowsheet data found.  Fall Risk Assessment Fall Risk  11/03/2017 11/18/2016 09/11/2015 09/09/2014  Falls in the past year? Yes No No No  Number falls in past yr: 1 - - -  Injury with Fall? Yes - - -  Comment sprained ankle - - -  Follow up Falls prevention discussed - - -     Depression Screen PHQ 2/9 Scores 11/03/2017 11/18/2016 09/11/2015 09/09/2014  PHQ - 2 Score 0 0  0 0    Cognitive Testing - 6-CIT  Correct? Score   What year is it? {yes no:22349} {0-4:31231} 0 or 4  What month is it? {yes no:22349} {0-3:21082} 0 or 3  Memorize:    Pia Mau,  44,  [place],      What time is it? (within 1 hour) {yes no:22349} {0-3:21082} 0 or 3  Count backwards from 20 {yes no:22349} {0-4:31231} 0, 2, or 4  Name the months of the year {yes no:22349} {0-4:31231} 0, 2, or 4  Repeat name & address above {yes no:22349} {0-10:5044} 0, 2, 4, 6, 8, or 10  TOTAL SCORE  ***/28   Interpretation:  {normal/abnormal:11317::"Normal"}  Normal (0-7) Abnormal (8-28)       Assessment & Plan:     Annual Wellness Visit  Reviewed patient's Family Medical History Reviewed and updated list of patient's medical providers Assessment of cognitive impairment was done Assessed patient's functional ability Established a written schedule for health screening services Health Risk Assessent Completed and Reviewed  Exercise Activities and Dietary recommendations Goals    . DIET - INCREASE WATER INTAKE     Recommend increasing water intake to 4-6 glasses a day.        Immunization History  Administered Date(s) Administered  . Influenza, High Dose Seasonal PF 01/05/2014  . Influenza,inj,quad, With Preservative 12/29/2017  . Influenza-Unspecified 12/29/2016, 12/31/2017  . Pneumococcal Conjugate-13 09/06/2013  . Pneumococcal Polysaccharide-23 02/08/2004  . Td 07/12/2003  . Zoster 09/29/2010  . Zoster Recombinat (Shingrix) 02/24/2018, 05/15/2018    Health Maintenance  Topic Date Due  . DEXA SCAN  09/28/2015  . INFLUENZA VACCINE  10/31/2018  . TETANUS/TDAP  01/01/2023 (Originally 07/11/2013)  . PNA vac Low Risk Adult  Completed     Discussed health benefits of physical activity, and encouraged her to engage in regular exercise appropriate for her age and condition.     ------------------------------------------------------------------------------------------------------------

## 2019-01-07 NOTE — Patient Instructions (Signed)
[name] , Thank you for taking time to come for your Medicare Wellness Visit. I appreciate your ongoing commitment to your health goals. Please review the following plan we discussed and let me know if I can assist you in the future.   Screening recommendations/referrals: Colonoscopy: No longer required.  Mammogram: No longer required.  Bone Density: Declined order today.  Recommended yearly ophthalmology/optometry visit for glaucoma screening and checkup Recommended yearly dental visit for hygiene and checkup  Vaccinations: Influenza vaccine: Administered today.  Pneumococcal vaccine: Completed series Tdap vaccine: Pt declines today.  Shingles vaccine: Completed series    Advanced directives: Advance directive discussed with you today. Even though you declined this today please call our office should you change your mind and we can give you the proper paperwork for you to fill out.  Conditions/risks identified: Continue to increase water intake to 6-8 8 oz glasses a day.   Next appointment: 11:00 AM today with Dr Mariann Barter.   Preventive Care 30 Years and Older, Female Preventive care refers to lifestyle choices and visits with your health care provider that can promote health and wellness. What does preventive care include?  A yearly physical exam. This is also called an annual well check.  Dental exams once or twice a year.  Routine eye exams. Ask your health care provider how often you should have your eyes checked.  Personal lifestyle choices, including:  Daily care of your teeth and gums.  Regular physical activity.  Eating a healthy diet.  Avoiding tobacco and drug use.  Limiting alcohol use.  Practicing safe sex.  Taking low-dose aspirin every day.  Taking vitamin and mineral supplements as recommended by your health care provider. What happens during an annual well check? The services and screenings done by your health care provider during your annual  well check will depend on your age, overall health, lifestyle risk factors, and family history of disease. Counseling  Your health care provider may ask you questions about your:  Alcohol use.  Tobacco use.  Drug use.  Emotional well-being.  Home and relationship well-being.  Sexual activity.  Eating habits.  History of falls.  Memory and ability to understand (cognition).  Work and work Statistician.  Reproductive health. Screening  You may have the following tests or measurements:  Height, weight, and BMI.  Blood pressure.  Lipid and cholesterol levels. These may be checked every 5 years, or more frequently if you are over 13 years old.  Skin check.  Lung cancer screening. You may have this screening every year starting at age 28 if you have a 30-pack-year history of smoking and currently smoke or have quit within the past 15 years.  Fecal occult blood test (FOBT) of the stool. You may have this test every year starting at age 28.  Flexible sigmoidoscopy or colonoscopy. You may have a sigmoidoscopy every 5 years or a colonoscopy every 10 years starting at age 33.  Hepatitis C blood test.  Hepatitis B blood test.  Sexually transmitted disease (STD) testing.  Diabetes screening. This is done by checking your blood sugar (glucose) after you have not eaten for a while (fasting). You may have this done every 1-3 years.  Bone density scan. This is done to screen for osteoporosis. You may have this done starting at age 66.  Mammogram. This may be done every 1-2 years. Talk to your health care provider about how often you should have regular mammograms. Talk with your health care provider about your test  results, treatment options, and if necessary, the need for more tests. Vaccines  Your health care provider may recommend certain vaccines, such as:  Influenza vaccine. This is recommended every year.  Tetanus, diphtheria, and acellular pertussis (Tdap, Td) vaccine.  You may need a Td booster every 10 years.  Zoster vaccine. You may need this after age 57.  Pneumococcal 13-valent conjugate (PCV13) vaccine. One dose is recommended after age 37.  Pneumococcal polysaccharide (PPSV23) vaccine. One dose is recommended after age 30. Talk to your health care provider about which screenings and vaccines you need and how often you need them. This information is not intended to replace advice given to you by your health care provider. Make sure you discuss any questions you have with your health care provider. Document Released: 04/14/2015 Document Revised: 12/06/2015 Document Reviewed: 01/17/2015 Elsevier Interactive Patient Education  2017 [place] Prevention in the Home Falls can cause injuries. They can happen to people of all ages. There are many things you can do to make your home safe and to help prevent falls. What can I do on the outside of my home?  Regularly fix the edges of walkways and driveways and fix any cracks.  Remove anything that might make you trip as you walk through a door, such as a raised step or threshold.  Trim any bushes or trees on the path to your home.  Use bright outdoor lighting.  Clear any walking paths of anything that might make someone trip, such as rocks or tools.  Regularly check to see if handrails are loose or broken. Make sure that both sides of any steps have handrails.  Any raised decks and porches should have guardrails on the edges.  Have any leaves, snow, or ice cleared regularly.  Use sand or salt on walking paths during winter.  Clean up any spills in your garage right away. This includes oil or grease spills. What can I do in the bathroom?  Use night lights.  Install grab bars by the toilet and in the tub and shower. Do not use towel bars as grab bars.  Use non-skid mats or decals in the tub or shower.  If you need to sit down in the shower, use a plastic, non-slip stool.  Keep the  floor dry. Clean up any water that spills on the floor as soon as it happens.  Remove soap buildup in the tub or shower regularly.  Attach bath mats securely with double-sided non-slip rug tape.  Do not have throw rugs and other things on the floor that can make you trip. What can I do in the bedroom?  Use night lights.  Make sure that you have a light by your bed that is easy to reach.  Do not use any sheets or blankets that are too big for your bed. They should not hang down onto the floor.  Have a firm chair that has side arms. You can use this for support while you get dressed.  Do not have throw rugs and other things on the floor that can make you trip. What can I do in the kitchen?  Clean up any spills right away.  Avoid walking on wet floors.  Keep items that you use a lot in easy-to-reach places.  If you need to reach something above you, use a strong step stool that has a grab bar.  Keep electrical cords out of the way.  Do not use floor polish or wax that makes  floors slippery. If you must use wax, use non-skid floor wax.  Do not have throw rugs and other things on the floor that can make you trip. What can I do with my stairs?  Do not leave any items on the stairs.  Make sure that there are handrails on both sides of the stairs and use them. Fix handrails that are broken or loose. Make sure that handrails are as long as the stairways.  Check any carpeting to make sure that it is firmly attached to the stairs. Fix any carpet that is loose or worn.  Avoid having throw rugs at the top or bottom of the stairs. If you do have throw rugs, attach them to the floor with carpet tape.  Make sure that you have a light switch at the top of the stairs and the bottom of the stairs. If you do not have them, ask someone to add them for you. What else can I do to help prevent falls?  Wear shoes that:  Do not have high heels.  Have rubber bottoms.  Are comfortable and fit  you well.  Are closed at the toe. Do not wear sandals.  If you use a stepladder:  Make sure that it is fully opened. Do not climb a closed stepladder.  Make sure that both sides of the stepladder are locked into place.  Ask someone to hold it for you, if possible.  Clearly mark and make sure that you can see:  Any grab bars or handrails.  First and last steps.  Where the edge of each step is.  Use tools that help you move around (mobility aids) if they are needed. These include:  Canes.  Walkers.  Scooters.  Crutches.  Turn on the lights when you go into a dark area. Replace any light bulbs as soon as they burn out.  Set up your furniture so you have a clear path. Avoid moving your furniture around.  If any of your floors are uneven, fix them.  If there are any pets around you, be aware of where they are.  Review your medicines with your doctor. Some medicines can make you feel dizzy. This can increase your chance of falling. Ask your doctor what other things that you can do to help prevent falls. This information is not intended to replace advice given to you by your health care provider. Make sure you discuss any questions you have with your health care provider. Document Released: 01/12/2009 Document Revised: 08/24/2015 Document Reviewed: 04/22/2014 Elsevier Interactive Patient Education  2017 [employer].

## 2019-01-07 NOTE — Assessment & Plan Note (Signed)
Recheck A1c 

## 2019-01-07 NOTE — Assessment & Plan Note (Signed)
Encouraged high-protein diet Discussed importance of normal weight

## 2019-01-07 NOTE — Patient Instructions (Signed)
Preventive Care 83 Years and Older, Female Preventive care refers to lifestyle choices and visits with your health care provider that can promote health and wellness. This includes:  A yearly physical exam. This is also called an annual well check.  Regular dental and eye exams.  Immunizations.  Screening for certain conditions.  Healthy lifestyle choices, such as diet and exercise. What can I expect for my preventive care visit? Physical exam Your health care provider will check:  Height and weight. These may be used to calculate body mass index (BMI), which is a measurement that tells if you are at a healthy weight.  Heart rate and blood pressure.  Your skin for abnormal spots. Counseling Your health care provider may ask you questions about:  Alcohol, tobacco, and drug use.  Emotional well-being.  Home and relationship well-being.  Sexual activity.  Eating habits.  History of falls.  Memory and ability to understand (cognition).  Work and work Statistician.  Pregnancy and menstrual history. What immunizations do I need?  Influenza (flu) vaccine  This is recommended every year. Tetanus, diphtheria, and pertussis (Tdap) vaccine  You may need a Td booster every 10 years. Varicella (chickenpox) vaccine  You may need this vaccine if you have not already been vaccinated. Zoster (shingles) vaccine  You may need this after age 83. Pneumococcal conjugate (PCV13) vaccine  One dose is recommended after age 83. Pneumococcal polysaccharide (PPSV23) vaccine  One dose is recommended after age 83. Measles, mumps, and rubella (MMR) vaccine  You may need at least one dose of MMR if you were born in 1957 or later. You may also need a second dose. Meningococcal conjugate (MenACWY) vaccine  You may need this if you have certain conditions. Hepatitis A vaccine  You may need this if you have certain conditions or if you travel or work in places where you may be exposed  to hepatitis A. Hepatitis B vaccine  You may need this if you have certain conditions or if you travel or work in places where you may be exposed to hepatitis B. Haemophilus influenzae type b (Hib) vaccine  You may need this if you have certain conditions. You may receive vaccines as individual doses or as more than one vaccine together in one shot (combination vaccines). Talk with your health care provider about the risks and benefits of combination vaccines. What tests do I need? Blood tests  Lipid and cholesterol levels. These may be checked every 5 years, or more frequently depending on your overall health.  Hepatitis C test.  Hepatitis B test. Screening  Lung cancer screening. You may have this screening every year starting at age 83 if you have a 30-pack-year history of smoking and currently smoke or have quit within the past 15 years.  Colorectal cancer screening. All adults should have this screening starting at age 83 and continuing until age 83. Your health care provider may recommend screening at age 23 if you are at increased risk. You will have tests every 1-10 years, depending on your results and the type of screening test.  Diabetes screening. This is done by checking your blood sugar (glucose) after you have not eaten for a while (fasting). You may have this done every 1-3 years.  Mammogram. This may be done every 1-2 years. Talk with your health care provider about how often you should have regular mammograms.  BRCA-related cancer screening. This may be done if you have a family history of breast, ovarian, tubal, or peritoneal cancers.  Other tests  Sexually transmitted disease (STD) testing.  Bone density scan. This is done to screen for osteoporosis. You may have this done starting at age 83. Follow these instructions at home: Eating and drinking  Eat a diet that includes fresh fruits and vegetables, whole grains, lean protein, and low-fat dairy products. Limit  your intake of foods with high amounts of sugar, saturated fats, and salt.  Take vitamin and mineral supplements as recommended by your health care provider.  Do not drink alcohol if your health care provider tells you not to drink.  If you drink alcohol: ? Limit how much you have to 0-1 drink a day. ? Be aware of how much alcohol is in your drink. In the [place] one drink equals one 12 oz bottle of beer (355 mL), one 5 oz glass of wine (148 mL), or one 1 oz glass of hard liquor (44 mL). Lifestyle  Take daily care of your teeth and gums.  Stay active. Exercise for at least 30 minutes on 5 or more days each week.  Do not use any products that contain nicotine or tobacco, such as cigarettes, e-cigarettes, and chewing tobacco. If you need help quitting, ask your health care provider.  If you are sexually active, practice safe sex. Use a condom or other form of protection in order to prevent STIs (sexually transmitted infections).  Talk with your health care provider about taking a low-dose aspirin or statin. What's next?  Go to your health care provider once a year for a well check visit.  Ask your health care provider how often you should have your eyes and teeth checked.  Stay up to date on all vaccines. This information is not intended to replace advice given to you by your health care provider. Make sure you discuss any questions you have with your health care provider. Document Released: 04/14/2015 Document Revised: 03/12/2018 Document Reviewed: 03/12/2018 Elsevier Patient Education  2020 [employer].

## 2019-01-08 ENCOUNTER — Telehealth: Payer: Self-pay

## 2019-01-08 LAB — COMPREHENSIVE METABOLIC PANEL
ALT: 9 IU/L (ref 0–32)
AST: 18 IU/L (ref 0–40)
Albumin/Globulin Ratio: 1.8 (ref 1.2–2.2)
Albumin: 4.4 g/dL (ref 3.6–4.6)
Alkaline Phosphatase: 86 IU/L (ref 39–117)
BUN/Creatinine Ratio: 14 (ref 12–28)
BUN: 13 mg/dL (ref 8–27)
Bilirubin Total: 0.8 mg/dL (ref 0.0–1.2)
CO2: 22 mmol/L (ref 20–29)
Calcium: 9.8 mg/dL (ref 8.7–10.3)
Chloride: 102 mmol/L (ref 96–106)
Creatinine, Ser: 0.96 mg/dL (ref 0.57–1.00)
GFR calc Af Amer: 62 mL/min/{1.73_m2} (ref >59)
GFR calc non Af Amer: 54 mL/min/{1.73_m2} — ABNORMAL LOW (ref >59)
Globulin, Total: 2.4 g/dL (ref 1.5–4.5)
Glucose: 108 mg/dL — ABNORMAL HIGH (ref 65–99)
Potassium: 3.6 mmol/L (ref 3.5–5.2)
Sodium: 139 mmol/L (ref 134–144)
Total Protein: 6.8 g/dL (ref 6.0–8.5)

## 2019-01-08 LAB — LIPID PANEL
Chol/HDL Ratio: 3 ratio (ref 0.0–4.4)
Cholesterol, Total: 176 mg/dL (ref 100–199)
HDL: 59 mg/dL (ref >39)
LDL Chol Calc (NIH): 109 mg/dL — ABNORMAL HIGH (ref 0–99)
Triglycerides: 41 mg/dL (ref 0–149)
VLDL Cholesterol Cal: 8 mg/dL (ref 5–40)

## 2019-01-08 LAB — CBC
Hematocrit: 41.1 % (ref 34.0–46.6)
Hemoglobin: 14 g/dL (ref 11.1–15.9)
MCH: 31.3 pg (ref 26.6–33.0)
MCHC: 34.1 g/dL (ref 31.5–35.7)
MCV: 92 fL (ref 79–97)
Platelets: 249 10*3/uL (ref 150–450)
RBC: 4.47 x10E6/uL (ref 3.77–5.28)
RDW: 12.5 % (ref 11.7–15.4)
WBC: 4.4 10*3/uL (ref 3.4–10.8)

## 2019-01-08 LAB — HEMOGLOBIN A1C
Est. average glucose Bld gHb Est-mCnc: 108 mg/dL
Hgb A1c MFr Bld: 5.4 % (ref 4.8–5.6)

## 2019-01-08 NOTE — Telephone Encounter (Signed)
Pt advised.   Thanks,   -Laura  

## 2019-01-08 NOTE — Telephone Encounter (Signed)
-----   Message from Virginia Crews, MD sent at 01/08/2019  8:55 AM EDT ----- Normal/stable labs

## 2019-01-22 ENCOUNTER — Other Ambulatory Visit: Payer: Self-pay | Admitting: Family Medicine

## 2019-01-22 MED ORDER — HYDROCHLOROTHIAZIDE 25 MG PO TABS: 25.0000 mg | ORAL_TABLET | Freq: Every day | ORAL | 3 refills | Status: DC

## 2019-01-22 NOTE — Telephone Encounter (Signed)
[facility] faxed refill request for the following medications:  hydrochlorothiazide (HYDRODIURIL) 25 MG tablet - Needing refills     Please advise.  Thanks, [employer]

## 2019-01-26 ENCOUNTER — Other Ambulatory Visit: Payer: Self-pay | Admitting: Family Medicine

## 2019-01-26 MED ORDER — AMLODIPINE BESYLATE 2.5 MG PO TABS: 2.5000 mg | ORAL_TABLET | Freq: Every day | ORAL | 1 refills | Status: DC

## 2019-01-26 NOTE — Telephone Encounter (Signed)
[facility] faxed refill request for the following medications:  amLODipine (NORVASC) 2.5 MG tablet   Last Rx: 01/05/2018 LOV: 01/07/2019 Please advise. Thanks TNP

## 2019-01-27 ENCOUNTER — Other Ambulatory Visit: Payer: Self-pay

## 2019-01-27 ENCOUNTER — Encounter: Payer: Self-pay | Admitting: Physician Assistant

## 2019-01-27 ENCOUNTER — Ambulatory Visit (INDEPENDENT_AMBULATORY_CARE_PROVIDER_SITE_OTHER): Payer: Medicare Other | Admitting: Physician Assistant

## 2019-01-27 VITALS — BP 148/78 | HR 84 | Temp 96.9°F | Wt 105.2 lb

## 2019-01-27 DIAGNOSIS — R3989 Other symptoms and signs involving the genitourinary system: Secondary | ICD-10-CM | POA: Diagnosis not present

## 2019-01-27 DIAGNOSIS — R300 Dysuria: Secondary | ICD-10-CM | POA: Diagnosis not present

## 2019-01-27 LAB — POCT URINALYSIS DIPSTICK
Bilirubin, UA: NEGATIVE
Glucose, UA: NEGATIVE
Ketones, UA: NEGATIVE
Nitrite, UA: NEGATIVE
Protein, UA: NEGATIVE
Spec Grav, UA: <=1.005 — AB (ref 1.010–1.025)
Urobilinogen, UA: 0.2 E.U./dL
pH, UA: 7 (ref 5.0–8.0)

## 2019-01-27 NOTE — Progress Notes (Signed)
Patient: Susan Booth Female    DOB: September 12, 1930   85 y.o.   MRN: [id number] Visit Date: 01/27/2019  Today's Provider: Trinna Post, PA-C   Chief Complaint  Patient presents with  . Dysuria   Subjective:    I, [name] CMA, am acting as a Education administrator for [employer].    Dysuria  The current episode started yesterday. The problem has been gradually improving. Associated symptoms include hesitancy and nausea. Pertinent negatives include no discharge. She has tried nothing for the symptoms.   Patient with history of lichen sclerosis presents with the above symptoms and states that she had pain shooting down her legs. Denies fevers, chills, vomiting and back pain. She reports burning has improved this morning but still persists. No issues   Urinalysis    Component Value Date/Time   COLORURINE Straw 02/21/2013 1045   APPEARANCEUR Clear 02/21/2013 1045   LABSPEC 1.008 02/21/2013 1045   PHURINE 7.0 02/21/2013 1045   GLUCOSEU Negative 02/21/2013 1045   HGBUR Negative 02/21/2013 1045   BILIRUBINUR Negative 01/27/2019 1350   BILIRUBINUR Negative 02/21/2013 1045   KETONESUR Negative 02/21/2013 1045   PROTEINUR Negative 01/27/2019 1350   PROTEINUR Negative 02/21/2013 1045   UROBILINOGEN 0.2 01/27/2019 1350   NITRITE Negative 01/27/2019 1350   NITRITE Negative 02/21/2013 1045   LEUKOCYTESUR Moderate (2+) (A) 01/27/2019 1350   LEUKOCYTESUR Negative 02/21/2013 1045     No Known Allergies   Current Outpatient Medications:  .  amLODipine (NORVASC) 2.5 MG tablet, Take 1 tablet (2.5 mg total) by mouth daily., Disp: 90 tablet, Rfl: 1 .  amoxicillin (AMOXIL) 500 MG tablet, Take 500 mg by mouth. Take 1 tablet one hour prior to dental work., Disp: , Rfl:  .  aspirin EC 81 MG tablet, Take 81 mg by mouth daily., Disp: , Rfl:  .  Calcium Carbonate-Vitamin D 600-400 MG-UNIT per tablet, CALTRATE 600+D, 600-400MG -UNIT (Oral Tablet)  1 Every Day for 0 days  Quantity:  0.00;  Refills: 0   Ordered :12-Jan-2010  Abran Richard ;  Started 15-Aug-2008 Active Comments: DX: 733.90, Disp: , Rfl:  .  clobetasol cream (TEMOVATE) 0.05 %, APPLY ONE APPLICATION TOPICALLY TWO TIMES A WEEK (Patient taking differently: as needed. ), Disp: 30 g, Rfl: 1 .  fluticasone (FLONASE) 50 MCG/ACT nasal spray, Place 1 spray into both nostrils daily as needed., Disp: 48 g, Rfl: 3 .  hydrochlorothiazide (HYDRODIURIL) 25 MG tablet, Take 1 tablet (25 mg total) by mouth daily., Disp: 90 tablet, Rfl: 3 .  LORazepam (ATIVAN) 0.5 MG tablet, TAKE 1 TABLET BY MOUTH TWICE DAILY AND THEN 1 EVERY 6 HOURS AS NEEDED, Disp: 60 tablet, Rfl: 3 .  losartan (COZAAR) 100 MG tablet, Take 1 tablet (100 mg total) by mouth daily., Disp: 90 tablet, Rfl: 3 .  potassium chloride SA (K-DUR,KLOR-CON) 20 MEQ tablet, TAKE ONE (1) TABLET BY MOUTH TWO (2) TIMES DAILY, Disp: 180 tablet, Rfl: 3  Review of Systems  Gastrointestinal: Positive for nausea.  Genitourinary: Positive for dysuria and hesitancy.    Social History   Tobacco Use  . Smoking status: Never Smoker  . Smokeless tobacco: Never Used  Substance Use Topics  . Alcohol use: Yes    Comment: rare - 1 glass of wine      Objective:   BP (!) 148/78 (BP Location: Left Arm, Patient Position: Sitting, Cuff Size: Normal)   Pulse 84   Temp (!) 96.9 F (36.1 C) (Temporal)  Wt 105 lb 3.2 oz (47.7 kg)   BMI 18.06 kg/m  Vitals:   01/27/19 1341  BP: (!) 148/78  Pulse: 84  Temp: (!) 96.9 F (36.1 C)  TempSrc: Temporal  Weight: 105 lb 3.2 oz (47.7 kg)  Body mass index is 18.06 kg/m.   Physical Exam Constitutional:      Appearance: Normal appearance.  Cardiovascular:     Rate and Rhythm: Normal rate and regular rhythm.     Heart sounds: Normal heart sounds.  Pulmonary:     Effort: Pulmonary effort is normal.     Breath sounds: Normal breath sounds.  Abdominal:     General: Bowel sounds are normal.     Palpations: Abdomen is soft.      Tenderness: There is no abdominal tenderness. There is no right CVA tenderness or left CVA tenderness.  Skin:    General: Skin is warm and dry.  Neurological:     Mental Status: She is alert and oriented to person, place, and time. Mental status is at baseline.  Psychiatric:        Mood and Affect: Mood normal.        Behavior: Behavior normal.      No results found for any visits on 01/27/19.     Assessment & Plan    1. Dysuria  Some RBCs and WBCs. Have counseled patient she may wait until urine culture returns or we can start an abx. She would prefer to wait until culture returns with sensitivities.   - CULTURE, URINE COMPREHENSIVE  2. Possible urinary tract infection  - POCT urinalysis dipstick  The entirety of the information documented in the History of Present Illness, Review of Systems and Physical Exam were personally obtained by me. Portions of this information were initially documented by [facility] McClurkin, CMA and reviewed by me for thoroughness and accuracy.   F/u PRN       Trey Sailors, PA-C  [facility]

## 2019-01-27 NOTE — Patient Instructions (Signed)

## 2019-01-28 ENCOUNTER — Telehealth: Payer: Self-pay

## 2019-01-28 DIAGNOSIS — N3090 Cystitis, unspecified without hematuria: Secondary | ICD-10-CM

## 2019-01-28 MED ORDER — SULFAMETHOXAZOLE-TRIMETHOPRIM 800-160 MG PO TABS: 1.0000 | ORAL_TABLET | Freq: Two times a day (BID) | ORAL | 0 refills | Status: AC

## 2019-01-28 NOTE — Telephone Encounter (Signed)
Patient is requesting the antibiotic be sent to [facility]. She states she was in for a OV yesterday and was told to call back if she felt she needed a antibiotic.

## 2019-01-28 NOTE — Telephone Encounter (Signed)
Patient was advised and states that she already picked up the medication.

## 2019-01-28 NOTE — Telephone Encounter (Signed)
Bactrim DS BID x 3 days sent into [place] [facility].

## 2019-01-28 NOTE — Telephone Encounter (Signed)
Please review chart . KW 

## 2019-01-30 LAB — CULTURE, URINE COMPREHENSIVE

## 2019-02-01 ENCOUNTER — Telehealth: Payer: Self-pay

## 2019-02-01 NOTE — Telephone Encounter (Signed)
-----   Message from Trinna Post, [place] sent at 02/01/2019  1:21 PM EST ----- E. Coli sensitive to bactrim given.

## 2019-02-01 NOTE — Telephone Encounter (Signed)
LVMTRC 

## 2019-02-01 NOTE — Telephone Encounter (Signed)
Patient was advised.  

## 2019-02-05 ENCOUNTER — Encounter: Payer: Self-pay | Admitting: Physician Assistant

## 2019-02-05 ENCOUNTER — Ambulatory Visit: Payer: Medicare Other | Admitting: Physician Assistant

## 2019-02-05 ENCOUNTER — Other Ambulatory Visit: Payer: Self-pay

## 2019-02-05 VITALS — BP 131/71 | HR 77 | Temp 96.6°F | Resp 15 | Wt 105.8 lb

## 2019-02-05 DIAGNOSIS — K5900 Constipation, unspecified: Secondary | ICD-10-CM | POA: Diagnosis not present

## 2019-02-05 NOTE — Progress Notes (Signed)
Patient: Susan Booth Female    DOB: 1930-06-13   85 y.o.   MRN: [id number] Visit Date: 02/05/2019  Today's Provider: Trinna Post, PA-C   Chief Complaint  Patient presents with  . Fatigue   Subjective:     HPI  Patient returns to office today for follow up from 01/27/2019. At last visit patient was seen for symptoms of dysuria, patient states that symptoms have cleared and she no longer has dysuria or frequency but states that she is still fatigued. Patient states that she would like to discuss with physician about recent culture results as well today, stating that she did not understand. Patient concerned about presence of E. Coli in urine and if this will go away or remain with her.   Reports alternating constipation and diarrhea. Sometimes she will need to use a stool softener. She also reports occasional episodes of fecal incontinence especially when lifting things in her yard.   No Known Allergies   Current Outpatient Medications:  .  amLODipine (NORVASC) 2.5 MG tablet, Take 1 tablet (2.5 mg total) by mouth daily., Disp: 90 tablet, Rfl: 1 .  amoxicillin (AMOXIL) 500 MG tablet, Take 500 mg by mouth. Take 1 tablet one hour prior to dental work., Disp: , Rfl:  .  aspirin EC 81 MG tablet, Take 81 mg by mouth daily., Disp: , Rfl:  .  Calcium Carbonate-Vitamin D 600-400 MG-UNIT per tablet, CALTRATE 600+D, 600-400MG -UNIT (Oral Tablet)  1 Every Day for 0 days  Quantity: 0.00;  Refills: 0   Ordered :12-Jan-2010  Abran Richard ;  Started 15-Aug-2008 Active Comments: DX: 733.90, Disp: , Rfl:  .  clobetasol cream (TEMOVATE) 0.05 %, APPLY ONE APPLICATION TOPICALLY TWO TIMES A WEEK (Patient taking differently: as needed. ), Disp: 30 g, Rfl: 1 .  fluticasone (FLONASE) 50 MCG/ACT nasal spray, Place 1 spray into both nostrils daily as needed., Disp: 48 g, Rfl: 3 .  hydrochlorothiazide (HYDRODIURIL) 25 MG tablet, Take 1 tablet (25 mg total) by mouth daily., Disp: 90 tablet, Rfl: 3  .  LORazepam (ATIVAN) 0.5 MG tablet, TAKE 1 TABLET BY MOUTH TWICE DAILY AND THEN 1 EVERY 6 HOURS AS NEEDED, Disp: 60 tablet, Rfl: 3 .  losartan (COZAAR) 100 MG tablet, Take 1 tablet (100 mg total) by mouth daily., Disp: 90 tablet, Rfl: 3 .  potassium chloride SA (K-DUR,KLOR-CON) 20 MEQ tablet, TAKE ONE (1) TABLET BY MOUTH TWO (2) TIMES DAILY, Disp: 180 tablet, Rfl: 3  Review of Systems  Constitutional: Positive for fatigue.  Respiratory: Negative.   Gastrointestinal: Positive for constipation.  Genitourinary: Negative.   Musculoskeletal: Negative.   Neurological: Negative.     Social History   Tobacco Use  . Smoking status: Never Smoker  . Smokeless tobacco: Never Used  Substance Use Topics  . Alcohol use: Yes    Comment: rare - 1 glass of wine      Objective:   BP 131/71   Pulse 77   Temp (!) 96.6 F (35.9 C) (Oral)   Resp 15   Wt 105 lb 12.8 oz (48 kg)   BMI 18.16 kg/m  Vitals:   02/05/19 0941  BP: 131/71  Pulse: 77  Resp: 15  Temp: (!) 96.6 F (35.9 C)  TempSrc: Oral  Weight: 105 lb 12.8 oz (48 kg)  Body mass index is 18.16 kg/m.   Physical Exam Constitutional:      Appearance: Normal appearance.  Cardiovascular:     Rate  and Rhythm: Normal rate.  Pulmonary:     Effort: Pulmonary effort is normal.  Skin:    General: Skin is warm and dry.  Neurological:     Mental Status: She is alert and oriented to person, place, and time. Mental status is at baseline.  Psychiatric:        Mood and Affect: Mood normal.        Behavior: Behavior normal.      No results found for any visits on 02/05/19.     Assessment & Plan    1. Constipation, unspecified constipation type  Explained that E. Coli is the most common organism to cause a UTI. Explained it is typically from contamination from the GI tract. The fact that she has some episodes of fecal incontinence is a likely source of infection. Counseled we tend to base our success in treatment on whether her  symptoms improved, which they have. Advised she could try some metamucil/fiber supplement to regulate bowel movements and bulk up stool as well in hopes of preventing incontinence. She may get UTIs in the future, which would be considered reinfection rather than a lingering infection.   The entirety of the information documented in the History of Present Illness, Review of Systems and Physical Exam were personally obtained by me. Portions of this information were initially documented by Sheliah Hatch, CMA and reviewed by me for thoroughness and accuracy.   F/u PRN   I have spent 15 minutes with this patient, >50% of which was spent on counseling and coordination of care.          Trey Sailors, PA-C  [facility]

## 2019-02-05 NOTE — Patient Instructions (Signed)

## 2019-04-19 ENCOUNTER — Other Ambulatory Visit: Payer: Self-pay | Admitting: Family Medicine

## 2019-05-17 ENCOUNTER — Other Ambulatory Visit: Payer: Self-pay | Admitting: Family Medicine

## 2019-05-17 DIAGNOSIS — F419 Anxiety disorder, unspecified: Secondary | ICD-10-CM

## 2019-05-17 NOTE — Telephone Encounter (Signed)
Requested medication (s) are due for refill today: yes  Requested medication (s) are on the active medication list: yes  Last refill:  09/30/2018  Future visit scheduled: yes  Notes to clinic:  not delegated   Requested Prescriptions  Pending Prescriptions Disp Refills   LORazepam (ATIVAN) 0.5 MG tablet [Pharmacy Med Name: LORAZEPAM 0.5 MG TABLET] 60 tablet 0    Sig: TAKE 1 TABLET BY MOUTH TWICE DAILY AND THEN 1 EVERY 6 HOURS AS NEEDED      Not Delegated - Psychiatry:  Anxiolytics/Hypnotics Failed - 05/17/2019 10:40 AM      Failed - This refill cannot be delegated      Failed - Urine Drug Screen completed in last 360 days.      Passed - Valid encounter within last 6 months    Recent Outpatient Visits           3 months ago Constipation, unspecified constipation type   [facility] [place], Lavella Hammock, [place]   3 months ago Dysuria   [facility] [place], Lavella Hammock, [place]   4 months ago Encounter for annual physical exam   [facility] [place], Marzella Schlein, [place]   7 months ago Benign hypertension   [facility], Marzella Schlein, MD   1 year ago Benign hypertension   [facility] Bacigalupo, Marzella Schlein, MD       Future Appointments             In 1 month Bacigalupo, Marzella Schlein, MD [facility]

## 2019-06-29 ENCOUNTER — Ambulatory Visit: Payer: Medicare PPO | Admitting: Family Medicine

## 2019-06-29 ENCOUNTER — Other Ambulatory Visit: Payer: Self-pay

## 2019-06-29 ENCOUNTER — Encounter: Payer: Self-pay | Admitting: Family Medicine

## 2019-06-29 VITALS — BP 142/75 | HR 74 | Temp 96.8°F | Wt 106.0 lb

## 2019-06-29 DIAGNOSIS — I10 Essential (primary) hypertension: Secondary | ICD-10-CM

## 2019-06-29 DIAGNOSIS — R636 Underweight: Secondary | ICD-10-CM

## 2019-06-29 DIAGNOSIS — K5900 Constipation, unspecified: Secondary | ICD-10-CM

## 2019-06-29 DIAGNOSIS — F419 Anxiety disorder, unspecified: Secondary | ICD-10-CM

## 2019-06-29 MED ORDER — LORAZEPAM 0.5 MG PO TABS: 0.5000 mg | ORAL_TABLET | Freq: Two times a day (BID) | ORAL | 0 refills | Status: DC | PRN

## 2019-06-29 NOTE — Progress Notes (Signed)
Patient: Susan Booth Female    DOB: 02/12/1931   86 y.o.   MRN: [id number] Visit Date: 06/29/2019  Today's Provider: Shirlee Latch, MD   Chief Complaint  Patient presents with  . Hypertension  . Anxiety    Pt would like to discuss options for Lorazepam.  Insurance will not pay for it anymore.  . Constipation   Subjective:     HPI    Hypertension, follow-up:  BP Readings from Last 3 Encounters:  06/29/19 (!) 148/79  02/05/19 131/71  01/27/19 (!) 148/78    She was last seen for hypertension 6 months ago.  Management since that visit includes no changes. She reports excellent compliance with treatment. She is not having side effects.  She is exercising. She is adherent to low salt diet.   Outside blood pressures are not being checked. She is experiencing none.  Patient denies chest pain, fatigue, irregular heart beat and lower extremity edema.   Cardiovascular risk factors include advanced age (older than 22 for men, 24 for women) and hypertension.  Use of agents associated with hypertension: none.     Weight trend: stable Wt Readings from Last 3 Encounters:  06/29/19 106 lb (48.1 kg)  02/05/19 105 lb 12.8 oz (48 kg)  01/27/19 105 lb 3.2 oz (47.7 kg)    Current diet: in general, a "healthy" diet    ------------------------------------------------------------------------ A lot of stress over the last few months with her husband and sister's poor health.  Taking Ativan very rarely.  Only refills about every 6 months.  Reports it is no longer covered by insurance.   No Known Allergies   Current Outpatient Medications:  .  amLODipine (NORVASC) 2.5 MG tablet, Take 1 tablet (2.5 mg total) by mouth daily., Disp: 90 tablet, Rfl: 1 .  amoxicillin (AMOXIL) 500 MG tablet, Take 500 mg by mouth. Take 1 tablet one hour prior to dental work., Disp: , Rfl:  .  aspirin EC 81 MG tablet, Take 81 mg by mouth daily., Disp: , Rfl:  .  Calcium Carbonate-Vitamin D  600-400 MG-UNIT per tablet, CALTRATE 600+D, 600-400MG -UNIT (Oral Tablet)  1 Every Day for 0 days  Quantity: 0.00;  Refills: 0   Ordered :12-Jan-2010  [name] ;  Started 15-Aug-2008 Active Comments: DX: 733.90, Disp: , Rfl:  .  clobetasol cream (TEMOVATE) 0.05 %, APPLY ONE APPLICATION TOPICALLY TWO TIMES A WEEK (Patient taking differently: as needed. ), Disp: 30 g, Rfl: 1 .  fluticasone (FLONASE) 50 MCG/ACT nasal spray, Place 1 spray into both nostrils daily as needed., Disp: 48 g, Rfl: 3 .  hydrochlorothiazide (HYDRODIURIL) 25 MG tablet, Take 1 tablet (25 mg total) by mouth daily., Disp: 90 tablet, Rfl: 3 .  LORazepam (ATIVAN) 0.5 MG tablet, TAKE 1 TABLET BY MOUTH TWICE DAILY AND THEN 1 EVERY 6 HOURS AS NEEDED, Disp: 60 tablet, Rfl: 0 .  losartan (COZAAR) 100 MG tablet, Take 1 tablet (100 mg total) by mouth daily., Disp: 90 tablet, Rfl: 0 .  potassium chloride SA (K-DUR,KLOR-CON) 20 MEQ tablet, TAKE ONE (1) TABLET BY MOUTH TWO (2) TIMES DAILY, Disp: 180 tablet, Rfl: 3  Review of Systems  Constitutional: Negative.   Respiratory: Negative.   Cardiovascular: Negative.   Gastrointestinal: Positive for constipation. Negative for abdominal distention, abdominal pain, anal bleeding, blood in stool, diarrhea, nausea, rectal pain and vomiting.  Neurological: Negative for dizziness, light-headedness and headaches.  Psychiatric/Behavioral: Positive for sleep disturbance. The patient is nervous/anxious.  Social History   Tobacco Use  . Smoking status: Never Smoker  . Smokeless tobacco: Never Used  Substance Use Topics  . Alcohol use: Yes    Comment: rare - 1 glass of wine      Objective:   BP (!) 148/79 (BP Location: Right Arm, Patient Position: Sitting, Cuff Size: Small)   Pulse 74   Temp (!) 96.8 F (36 C) (Temporal)   Wt 106 lb (48.1 kg)   BMI 18.19 kg/m  Vitals:   06/29/19 0942  BP: (!) 148/79  Pulse: 74  Temp: (!) 96.8 F (36 C)  TempSrc: Temporal  Weight: 106 lb (48.1 kg)   Body mass index is 18.19 kg/m.   Physical Exam Vitals reviewed.  Constitutional:      General: She is not in acute distress.    Appearance: Normal appearance. She is well-developed. She is not diaphoretic.  HENT:     Head: Normocephalic and atraumatic.  Eyes:     General: No scleral icterus.    Conjunctiva/sclera: Conjunctivae normal.  Neck:     Thyroid: No thyromegaly.  Cardiovascular:     Rate and Rhythm: Normal rate and regular rhythm.     Pulses: Normal pulses.     Heart sounds: Normal heart sounds. No murmur.  Pulmonary:     Effort: Pulmonary effort is normal. No respiratory distress.     Breath sounds: Normal breath sounds. No wheezing, rhonchi or rales.  Abdominal:     General: There is no distension.     Palpations: Abdomen is soft.     Tenderness: There is no abdominal tenderness.  Musculoskeletal:     Cervical back: Neck supple.     Right lower leg: No edema.     Left lower leg: No edema.  Lymphadenopathy:     Cervical: No cervical adenopathy.  Skin:    General: Skin is warm and dry.     Capillary Refill: Capillary refill takes less than 2 seconds.     Findings: No rash.  Neurological:     Mental Status: She is alert and oriented to person, place, and time. Mental status is at baseline.  Psychiatric:        Mood and Affect: Mood normal.        Behavior: Behavior normal.      No results found for any visits on 06/29/19.     Assessment & Plan    Problem List Items Addressed This Visit      Cardiovascular and Mediastinum   Benign hypertension - Primary    Slightly elevated in setting of increased stress previously well controlled Advised to monitor home readings No changes to medications today Recheck metabolic panel      Relevant Orders   Basic Metabolic Panel (BMET) (Completed)     Other   Anxiety    Chronic and previously well controlled Exacerbated recently by health issues in her sister and husband We have discussed SSRI therapy, but  she would like to wait and see if stress improves She understands the risk of continued benzo use at her advanced age and she uses this very limitedly      Relevant Medications   LORazepam (ATIVAN) 0.5 MG tablet   Constipation    Longstanding issue Discussed it is okay to continue using Metamucil daily Also try to increase fiber in her diet      Underweight    Again discussed importance of normal weight and risks of being underweight Encourage high-protein diet  Return in about 6 months (around 12/30/2019) for CPE/AWV, as scheduled.   The entirety of the information documented in the History of Present Illness, Review of Systems and Physical Exam were personally obtained by me. Portions of this information were initially documented by Ashley Royalty, CMA and reviewed by me for thoroughness and accuracy.    Bacigalupo, Dionne Bucy, MD MPH [facility]

## 2019-06-29 NOTE — Patient Instructions (Signed)
Fiber Content in Foods  See the following list for the dietary fiber content of some common foods. High-fiber foods High-fiber foods contain 4 grams or more (4g or more) of fiber per serving. They include:  Artichoke (fresh) -- 1 medium has 10.3g of fiber.  Baked beans, plain or vegetarian (canned) --  cup has 5.2g of fiber.  Blackberries or raspberries (fresh) --  cup has 4g of fiber.  Bran cereal --  cup has 8.6g of fiber.  Bulgur (cooked) --  cup has 4g of fiber.  Kidney beans (canned) --  cup has 6.8g of fiber.  Lentils (cooked) --  cup has 7.8g of fiber.  Pear (fresh) -- 1 medium has 5.1g of fiber.  Peas (frozen) --  cup has 4.4g of fiber.  Pinto beans (canned) --  cup has 5.5g of fiber.  Pinto beans (dried and cooked) --  cup has 7.7g of fiber.  Potato with skin (baked) -- 1 medium has 4.4g of fiber.  Quinoa (cooked) --  cup has 5g of fiber.  Soybeans (canned, frozen, or fresh) --  cup has 5.1g of fiber. Moderate-fiber foods Moderate-fiber foods contain 1-4 grams (1-4g) of fiber per serving. They include:  Almonds -- 1 oz. has 3.5g of fiber.  Apple with skin -- 1 medium has 3.3g of fiber.  Applesauce, sweetened --  cup has 1.5g of fiber.  Bagel, plain -- one 4-inch (10-cm) bagel has 2g of fiber.  Banana -- 1 medium has 3.1g of fiber.  Broccoli (cooked) --  cup has 2.5g of fiber.  Carrots (cooked) --  cup has 2.3g of fiber.  Corn (canned or frozen) --  cup has 2.1g of fiber.  Corn tortilla -- one 6-inch (15-cm) tortilla has 1.5g of fiber.  Green beans (canned) --  cup has 2g of fiber.  Instant oatmeal --  cup has about 2g of fiber.  Long-grain brown rice (cooked) -- 1 cup has 3.5g of fiber.  Macaroni, enriched (cooked) -- 1 cup has 2.5g of fiber.  Melon -- 1 cup has 1.4g of fiber.  Multigrain cereal --  cup has about 2-4g of fiber.  Orange -- 1 small has 3.1g of fiber.  Potatoes, mashed --  cup has 1.6g of fiber.  Raisins  -- 1/4 cup has 1.6g of fiber.  Squash --  cup has 2.9g of fiber.  Sunflower seeds --  cup has 1.1g of fiber.  Tomato -- 1 medium has 1.5g of fiber.  Vegetable or soy patty -- 1 has 3.4g of fiber.  Whole-wheat bread -- 1 slice has 2g of fiber.  Whole-wheat spaghetti --  cup has 3.2g of fiber. Low-fiber foods Low-fiber foods contain less than 1 gram (less than 1g) of fiber per serving. They include:  Egg -- 1 large.  Flour tortilla -- one 6-inch (15-cm) tortilla.  Fruit juice --  cup.  Lettuce -- 1 cup.  Meat, poultry, or fish -- 1 oz.  Milk -- 1 cup.  Spinach (raw) -- 1 cup.  White bread -- 1 slice.  White rice --  cup.  Yogurt --  cup. Actual amounts of fiber in foods may be different depending on processing. Talk with your dietitian about how much fiber you need in your diet. This information is not intended to replace advice given to you by your health care provider. Make sure you discuss any questions you have with your health care provider. Document Revised: 11/09/2015 Document Reviewed: 05/11/2015 Elsevier Patient Education  2020 Elsevier Inc.  

## 2019-06-30 ENCOUNTER — Telehealth: Payer: Self-pay

## 2019-06-30 LAB — BASIC METABOLIC PANEL
BUN/Creatinine Ratio: 12 (ref 12–28)
BUN: 11 mg/dL (ref 8–27)
CO2: 22 mmol/L (ref 20–29)
Calcium: 9.7 mg/dL (ref 8.7–10.3)
Chloride: 103 mmol/L (ref 96–106)
Creatinine, Ser: 0.91 mg/dL (ref 0.57–1.00)
GFR calc Af Amer: 66 mL/min/{1.73_m2} (ref >59)
GFR calc non Af Amer: 57 mL/min/{1.73_m2} — ABNORMAL LOW (ref >59)
Glucose: 102 mg/dL — ABNORMAL HIGH (ref 65–99)
Potassium: 3.6 mmol/L (ref 3.5–5.2)
Sodium: 141 mmol/L (ref 134–144)

## 2019-06-30 NOTE — Assessment & Plan Note (Signed)
Chronic and previously well controlled Exacerbated recently by health issues in her sister and husband We have discussed SSRI therapy, but she would like to wait and see if stress improves She understands the risk of continued benzo use at her advanced age and she uses this very limitedly

## 2019-06-30 NOTE — Telephone Encounter (Signed)
Patient advised as below.  

## 2019-06-30 NOTE — Telephone Encounter (Signed)
-----   Message from Erasmo Downer, MD sent at 06/30/2019  8:37 AM EDT ----- Normal/stable labs

## 2019-06-30 NOTE — Assessment & Plan Note (Signed)
Again discussed importance of normal weight and risks of being underweight Encourage high-protein diet

## 2019-06-30 NOTE — Assessment & Plan Note (Signed)
Longstanding issue Discussed it is okay to continue using Metamucil daily Also try to increase fiber in her diet

## 2019-06-30 NOTE — Assessment & Plan Note (Signed)
Slightly elevated in setting of increased stress previously well controlled Advised to monitor home readings No changes to medications today Recheck metabolic panel

## 2019-07-02 ENCOUNTER — Other Ambulatory Visit: Payer: Self-pay | Admitting: Family Medicine

## 2019-07-02 DIAGNOSIS — E876 Hypokalemia: Secondary | ICD-10-CM

## 2019-07-02 MED ORDER — POTASSIUM CHLORIDE CRYS ER 20 MEQ PO TBCR: EXTENDED_RELEASE_TABLET | ORAL | 3 refills | Status: DC

## 2019-07-02 NOTE — Telephone Encounter (Signed)
[employer] faxed refill request for the following medications:   potassium chloride SA (K-DUR,KLOR-CON) 20 MEQ tablet   Please advise.  Thanks, [employer]

## 2019-07-07 DIAGNOSIS — I10 Essential (primary) hypertension: Secondary | ICD-10-CM | POA: Diagnosis not present

## 2019-07-07 DIAGNOSIS — M25569 Pain in unspecified knee: Secondary | ICD-10-CM | POA: Diagnosis not present

## 2019-07-07 DIAGNOSIS — I351 Nonrheumatic aortic (valve) insufficiency: Secondary | ICD-10-CM | POA: Diagnosis not present

## 2019-07-07 DIAGNOSIS — R0789 Other chest pain: Secondary | ICD-10-CM | POA: Diagnosis not present

## 2019-07-07 DIAGNOSIS — I491 Atrial premature depolarization: Secondary | ICD-10-CM | POA: Diagnosis not present

## 2019-07-08 ENCOUNTER — Ambulatory Visit: Payer: Self-pay | Admitting: Family Medicine

## 2019-07-21 DIAGNOSIS — I351 Nonrheumatic aortic (valve) insufficiency: Secondary | ICD-10-CM | POA: Diagnosis not present

## 2019-07-26 ENCOUNTER — Other Ambulatory Visit: Payer: Self-pay | Admitting: Family Medicine

## 2019-07-26 DIAGNOSIS — F419 Anxiety disorder, unspecified: Secondary | ICD-10-CM

## 2019-07-26 NOTE — Telephone Encounter (Signed)
Requested medication (s) are due for refill today: Due 07/30/2019  Requested medication (s) are on the active medication list:yes   Last refill: 06/29/2019   #60  0 refills  Future visit scheduled Yes 01/12/2020  Notes to clinic: Not delegated  Requested Prescriptions  Pending Prescriptions Disp Refills   LORazepam (ATIVAN) 0.5 MG tablet [Pharmacy Med Name: LORAZEPAM 0.5 MG TABLET] 60 tablet 0    Sig: Take 1 tablet (0.5 mg total)by mouth 2 (two) times daily as needed for anxiety.      Not Delegated - Psychiatry:  Anxiolytics/Hypnotics Failed - 07/26/2019  1:34 PM      Failed - This refill cannot be delegated      Failed - Urine Drug Screen completed in last 360 days.      Passed - Valid encounter within last 6 months    Recent Outpatient Visits           3 weeks ago Benign hypertension   [facility] [place], Marzella Schlein, [place]   5 months ago Constipation, unspecified constipation type   [facility] Osvaldo Angst M, [place]   6 months ago Dysuria   [facility] [place] Osvaldo Angst M, PA-C   6 months ago Encounter for annual physical exam   [facility], Marzella Schlein, [place]   10 months ago Benign hypertension   [facility], Marzella Schlein, [place]

## 2019-08-10 ENCOUNTER — Other Ambulatory Visit: Payer: Self-pay | Admitting: Family Medicine

## 2019-10-11 ENCOUNTER — Ambulatory Visit
Admission: EM | Admit: 2019-10-11 | Discharge: 2019-10-11 | Disposition: A | Discharge status: Home / Self Care | Payer: Medicare PPO | Attending: Family Medicine | Admitting: Family Medicine

## 2019-10-11 ENCOUNTER — Encounter: Payer: Self-pay | Admitting: Emergency Medicine

## 2019-10-11 DIAGNOSIS — B372 Candidiasis of skin and nail: Secondary | ICD-10-CM | POA: Diagnosis not present

## 2019-10-11 DIAGNOSIS — N3001 Acute cystitis with hematuria: Secondary | ICD-10-CM

## 2019-10-11 LAB — POCT URINALYSIS DIP (MANUAL ENTRY)
Bilirubin, UA: NEGATIVE
Glucose, UA: NEGATIVE mg/dL
Ketones, POC UA: NEGATIVE mg/dL
Nitrite, UA: NEGATIVE
Protein Ur, POC: NEGATIVE mg/dL
Spec Grav, UA: >=1.03 — AB (ref 1.010–1.025)
Urobilinogen, UA: 0.2 E.U./dL
pH, UA: 6.5 (ref 5.0–8.0)

## 2019-10-11 MED ORDER — CEPHALEXIN 500 MG PO CAPS
500.0000 mg | ORAL_CAPSULE | Freq: Two times a day (BID) | ORAL | 0 refills | Status: AC
Start: 2019-10-11 — End: 2019-10-21

## 2019-10-11 MED ORDER — FLUCONAZOLE 150 MG PO TABS
150.0000 mg | ORAL_TABLET | Freq: Once | ORAL | 0 refills | Status: AC
Start: 2019-10-11 — End: 2019-10-11

## 2019-10-11 MED ORDER — NYSTATIN 100000 UNIT/GM EX CREA
TOPICAL_CREAM | CUTANEOUS | 1 refills | Status: DC
Start: 2019-10-11 — End: 2020-07-28

## 2019-10-11 NOTE — ED Provider Notes (Signed)
Susan Booth    CSN: [id number] Arrival date & time: 10/11/19  1545      History   Chief Complaint Chief Complaint  Patient presents with  . Urinary Tract Infection    HPI Susan Booth is a 86 y.o. female.   HPI  Patient presents today for evaluation of urinary frequency, burning with urination, and vaginal along with peritoneum irritation.  Patient has a history of Lichen sclerosus and reports previously being on a topical medication which was discontinued a while back per patient.  She also endorses some incontinent episodes and sometimes does not wear an absorbent pad.  The symptoms of urinary frequency and burning during urination started approximately 4 days ago however she had the skin irritation prior to the onset of urine frequency.  She denies fever, low back pain, nausea or vomiting.  Her heart rate is slightly elevated and she endorses poor oral intake of water.  She denies any dizziness, shortness of breath, headache or chest pain.  Past Medical History:  Diagnosis Date  . Anxiety   . Benign essential tremor   . Complication of anesthesia    nausea  . Gastroesophageal reflux disease   . Hypertension   . Lichen sclerosus et atrophicus of the vulva   . Murmur   . Need for SBE (subacute bacterial endocarditis) prophylaxis   . Osteoporosis, post-menopausal   . Other chest pain   . Paresthesia   . Rhinitis, allergic     Patient Active Problem List   Diagnosis Date Noted  . Underweight 01/07/2019  . Leg cramps 09/29/2018  . Jittery 11/24/2017  . Rectal burning 11/11/2017  . Constipation 11/11/2017  . Fatigue 04/29/2016  . Patella fracture 11/24/2015  . Rectal itching 07/04/2015  . Heart palpitations 11/25/2014  . Lichen sclerosus et atrophicus of the vulva   . Atypical chest pain 08/26/2014  . Paresthesia 08/26/2014  . Allergic rhinitis 07/22/2014  . Anxiety 07/22/2014  . Benign essential tremor 07/22/2014  . Acid reflux 07/22/2014  . OP  (osteoporosis) 07/22/2014  . Bone/cartilage disorder 08/19/2008  . Nonrheumatic tricuspid valve disorder 08/19/2008  . Colon, diverticulosis 08/15/2008  . Benign hypertension 08/15/2008  . Primary pulmonary hypertension (HCC) 08/15/2008  . H/O total hysterectomy 04/02/1967    Past Surgical History:  Procedure Laterality Date  . ABDOMINAL HYSTERECTOMY  1968  . ASD REPAIR, SECUNDUM  03/2007  . CATARACT EXTRACTION Bilateral 06/2012  . CORRECTION HAMMER TOE Bilateral 2003  . hole in heart repaired     continue to have some leakage  . ORIF PATELLA Left 11/24/2015   Procedure: OPEN REDUCTION INTERNAL (ORIF) FIXATION PATELLA;  Surgeon: Juanell Fairly, MD;  Location: [facility] ORS;  Service: Orthopedics;  Laterality: Left;  (PER Dr Rico Ala Spinal would be ideal     OB History    Gravida  2   Para  2   Term  2   Preterm      AB      Living  2     SAB      TAB      Ectopic      Multiple      Live Births  2            Home Medications    Prior to Admission medications   Medication Sig Start Date End Date Taking? Authorizing Provider  amLODipine (NORVASC) 2.5 MG tablet Take 1 tablet (2.5 mg total) by mouth daily. 08/10/19  Yes Bacigalupo, Marzella Schlein, MD  amoxicillin (AMOXIL) 500 MG tablet Take 500 mg by mouth. Take 1 tablet one hour prior to dental work.   Yes [provider]  aspirin EC 81 MG tablet Take 81 mg by mouth daily.   Yes [provider]  Calcium Carbonate-Vitamin D 600-400 MG-UNIT per tablet CALTRATE 600+D, 600-400MG -UNIT (Oral Tablet)  1 Every Day for 0 days  Quantity: 0.00;  Refills: 0   Ordered :12-Jan-2010  [name] ;  Started 15-Aug-2008 Active Comments: DX: 733.90 08/15/08  Yes [provider]  clobetasol cream (TEMOVATE) 0.05 % APPLY ONE APPLICATION TOPICALLY TWO TIMES A WEEK Patient taking differently: as needed.  05/19/17  Yes Defrancesco, Prentice Docker, MD  fluticasone (FLONASE) 50 MCG/ACT nasal spray Place 1 spray into both  nostrils daily as needed. 05/20/17  Yes Bacigalupo, Marzella Schlein, MD  hydrochlorothiazide (HYDRODIURIL) 25 MG tablet Take 1 tablet (25 mg total) by mouth daily. 01/22/19  Yes Osvaldo Angst M, PA-C  LORazepam (ATIVAN) 0.5 MG tablet Take 1 tablet (0.5 mg total)by mouth 2 (two) times daily as needed for anxiety. 07/26/19  Yes Bacigalupo, Marzella Schlein, MD  losartan (COZAAR) 100 MG tablet Take 1 tablet (100 mg total) by mouth daily. 04/19/19  Yes Bacigalupo, Marzella Schlein, MD  potassium chloride SA (KLOR-CON) 20 MEQ tablet TAKE ONE (1) TABLET BY MOUTH TWO (2) TIMES DAILY 07/02/19  Yes Bacigalupo, Marzella Schlein, MD  cephALEXin (KEFLEX) 500 MG capsule Take 1 capsule (500 mg total) by mouth 2 (two) times daily for 10 days. 10/11/19 10/21/19  Bing Neighbors, FNP  fluconazole (DIFLUCAN) 150 MG tablet Take 1 tablet (150 mg total) by mouth once for 1 dose. Repeat if needed 10/11/19 10/11/19  Bing Neighbors, FNP  nystatin cream (MYCOSTATIN) Apply to affected area 2 times daily 10/11/19   Bing Neighbors, FNP    Family History Family History  Problem Relation Age of Onset  . COPD Mother   . Early death Mother   . Lung cancer Mother   . Heart disease Father   . Heart attack Father   . Heart disease Sister        heart valve problem  . Breast cancer Maternal Aunt 70  . Prostate cancer Son   . Prostate cancer Son     Social History Social History   Tobacco Use  . Smoking status: Never Smoker  . Smokeless tobacco: Never Used  Vaping Use  . Vaping Use: Never used  Substance Use Topics  . Alcohol use: Yes    Comment: rare - 1 glass of wine  . Drug use: No     Allergies   Patient has no known allergies.   Review of Systems Review of Systems Pertinent negatives listed in HPI  Physical Exam Triage Vital Signs ED Triage Vitals  Enc Vitals Group     BP 10/11/19 1601 (!) 166/83     Pulse Rate 10/11/19 1601 (!) 103     Resp 10/11/19 1601 16     Temp 10/11/19 1601 97.9 F (36.6 C)     Temp Source  10/11/19 1601 Oral     SpO2 10/11/19 1601 100 %     Weight --      Height --      Head Circumference --      Peak Flow --      Pain Score 10/11/19 1603 0     Pain Loc --      Pain Edu? --      Excl. in  GC? --    No data found.  Updated Vital Signs BP (!) 166/83 (BP Location: Left Arm)   Pulse (!) 103   Temp 97.9 F (36.6 C) (Oral)   Resp 16   SpO2 100%   Visual Acuity Right Eye Distance:   Left Eye Distance:   Bilateral Distance:    Right Eye Near:   Left Eye Near:    Bilateral Near:     Physical Exam General appearance: alert, well developed, well nourished, cooperative and in no distress Head: Normocephalic, without obvious abnormality, atraumatic Respiratory: Respirations even and unlabored, normal respiratory rate Heart: Tachycardia.  Regular rhythm  Abdomen: BS +, no distention, no rebound tenderness, or no mass, no CVA tenderness Extremities: No gross deformities Skin: Skin color, texture, turgor normal. No rashes seen  Psych: Appropriate mood and affect. Neurologic:  Alert, oriented to person, place, and time, thought content appropriate.   UC Treatments / Results  Labs (all labs ordered are listed, but only abnormal results are displayed) Labs Reviewed  POCT URINALYSIS DIP (MANUAL ENTRY) - Abnormal; Notable for the following components:      Result Value   Spec Grav, UA >=1.030 (*)    Blood, UA moderate (*)    Leukocytes, UA Small (1+) (*)    All other components within normal limits  URINE CULTURE    EKG   Radiology No results found.  Procedures Procedures (including critical care time)  Medications Ordered in UC Medications - No data to display  Initial Impression / Assessment and Plan / UC Course  I have reviewed the triage vital signs and the nursing notes.  Pertinent labs & imaging results that were available during my care of the patient were reviewed by me and considered in my medical decision making (see chart for details).      UA significant small leuks, RBCs , with a SPG consistent with dehydration.  Encourage hydration with 3, 16 ounce bottles of water minimally per day.  Treating for a likely UTI given symptoms of frequency and burning with Keflex 500 mg twice daily x10 days. Reading also for what appears to be yeast dermatitis will prescribe Diflucan 150 mg 1 tablet may repeat after 3 days if symptoms persist.  Also prescribed a topical nystatin cream apply to affected areas twice a day as needed.  Also encourage patient to wear absorbable incontinence pads in order to prevent skin breakdown due to occasional stress incontinence.  Red flags discussed.  Patient verbalized understanding and agreement with plan.  Urine culture pending.  Final Clinical Impressions(s) / UC Diagnoses   Final diagnoses:  Yeast dermatitis  Acute cystitis with hematuria   Discharge Instructions   None    ED Prescriptions    Medication Sig Dispense Auth. Provider   fluconazole (DIFLUCAN) 150 MG tablet Take 1 tablet (150 mg total) by mouth once for 1 dose. Repeat if needed 2 tablet Bing Neighbors, FNP   nystatin cream (MYCOSTATIN) Apply to affected area 2 times daily 90 g Bing Neighbors, FNP   cephALEXin (KEFLEX) 500 MG capsule Take 1 capsule (500 mg total) by mouth 2 (two) times daily for 10 days. 20 capsule Bing Neighbors, FNP     PDMP not reviewed this encounter.   Bing Neighbors, FNP 10/11/19 1953

## 2019-10-11 NOTE — ED Triage Notes (Signed)
Pt c/o uti symptoms onset 2 days ago. Pt denies any bleeding. Pt states urination has been more frequent and painful at onset of symptoms.

## 2019-10-14 LAB — URINE CULTURE: Culture: >=100000 — AB

## 2019-11-05 ENCOUNTER — Other Ambulatory Visit: Payer: Self-pay | Admitting: Family Medicine

## 2019-11-18 ENCOUNTER — Other Ambulatory Visit: Payer: Self-pay

## 2019-11-18 ENCOUNTER — Encounter: Payer: Self-pay | Admitting: Obstetrics and Gynecology

## 2019-11-18 ENCOUNTER — Ambulatory Visit (INDEPENDENT_AMBULATORY_CARE_PROVIDER_SITE_OTHER): Payer: Medicare PPO | Admitting: Obstetrics and Gynecology

## 2019-11-18 VITALS — BP 134/72 | HR 73 | Ht 64.0 in | Wt 106.6 lb

## 2019-11-18 DIAGNOSIS — N904 Leukoplakia of vulva: Secondary | ICD-10-CM

## 2019-11-18 DIAGNOSIS — Z87440 Personal history of urinary (tract) infections: Secondary | ICD-10-CM | POA: Diagnosis not present

## 2019-11-18 DIAGNOSIS — R194 Change in bowel habit: Secondary | ICD-10-CM

## 2019-11-18 DIAGNOSIS — N952 Postmenopausal atrophic vaginitis: Secondary | ICD-10-CM

## 2019-11-18 MED ORDER — PREMARIN 0.625 MG/GM VA CREA
TOPICAL_CREAM | VAGINAL | 2 refills | Status: DC
Start: 2019-11-18 — End: 2020-01-28

## 2019-11-18 NOTE — Progress Notes (Signed)
Pt present for a f/u for lichen sclerosus.  Pt stated that has not noticed any changes in her symptoms since her last visit.

## 2019-11-18 NOTE — Progress Notes (Signed)
    GYNECOLOGY PROGRESS NOTE  Subjective:    Patient ID: Susan Booth, female    DOB: 13-Jun-1930, 86 y.o.   MRN: [id number]  HPI  Patient is a 86 y.o. [id number] female who presents for 1 year follow up of lichen sclerosis.  She reports she is overall doing well. Occasionally notes some itching and irritation.  Is using treatment (Temovate) as needed.   She also complains of having 2 urinary tract infections since November.  Notes that she has never had a UTI before this time.   Lastly, she notes that she is having issues with her bowel movements. Notes that she alternates between having hard stools and loose stools.  Takes Metamucil almost every day. Uses Miralax and stool softeners when constipation occurs.   The following portions of the patient's history were reviewed and updated as appropriate: allergies, current medications, past family history, past medical history, past social history, past surgical history and problem list.  Review of Systems Pertinent items noted in HPI and remainder of comprehensive ROS otherwise negative.   Objective:   Blood pressure 134/72, pulse 73, height 5\' 4"  (1.626 m), weight 106 lb 9.6 oz (48.4 kg). General appearance: alert and no distress Abdomen: soft, non-tender; bowel sounds normal; no masses,  no organomegaly Pelvic: external genitalia with atrophic labia and urethra. Faint lichenification at posterior fourchette. No evidence of epithelial skin breakdown, or ulceration. Vagina with moderate atrophic changes present; no discharge or lesions.  Bimanual-deferred. Rectum: no significant chronic skin changes seen today; no hemorrhoids; no rectal discharge or bleeding  Assessment:   Lichen sclerosis Vaginal atrophy H/o UTI Bowel habit changes  Plan:   - Patient can continue use of Temovate as needed.  - Given samples of premarin cream for likely genitourinary syndrome caused by vaginal atrophy. Will also prescribe.Can help reduce occurrences  of UTI's.  She also was advised to alternate between moist wipes and regular toilet paper.  - Discussed decreasing use of Metamucil to every other day. Maintaining bowel function with stool softeners as needed, limiting Miralax use unless significant constipation occurs. Also discussed other natural remedies for consitpation (coffee, apple/prune juice).  - To f/u in 1 year or sooner if symptoms reoccur.    , MD [facility]

## 2019-11-18 NOTE — Patient Instructions (Signed)
Atrophic Vaginitis Atrophic vaginitis is a condition in which the tissues that line the vagina become dry and thin. This condition occurs in women who have stopped having their period. It is caused by a drop in a female hormone (estrogen). This hormone helps:  To keep the vagina moist.  To make a clear fluid. This clear fluid helps: ? To make the vagina ready for sex. ? To protect the vagina from infection. If the lining of the vagina is dry and thin, it may cause irritation, burning, or itchiness. It may also:  Make sex painful.  Make an exam of your vagina painful.  Cause bleeding.  Make you lose interest in sex.  Cause a burning feeling when you pee (urinate).  Cause a brown or yellow fluid to come from your vagina. Some women do not have symptoms. Follow these instructions at home: Medicines  Take over-the-counter and prescription medicines only as told by your doctor.  Do not use herbs or other medicines unless your doctor says it is okay.  Use medicines for for dryness. These include: ? Oils to make the vagina soft. ? Creams. ? Moisturizers. General instructions  Do not douche.  Do not use products that can make your vagina dry. These include: ? Scented sprays. ? Scented tampons. ? Scented soaps.  Sex can help increase blood flow and soften the tissue in the vagina. If it hurts to have sex: ? Tell your partner. ? Use products to make sex more comfortable. Use these only as told by your doctor. Contact a doctor if you:  Have discharge from the vagina that is different than usual.  Have a bad smell coming from your vagina.  Have new symptoms.  Do not get better.  Get worse. Summary  Atrophic vaginitis is a condition in which the lining of the vagina becomes dry and thin.  This condition affects women who have stopped having their periods.  Treatment may include using products that help make the vagina soft.  Call a doctor if do not get better with  treatment. This information is not intended to replace advice given to you by your health care provider. Make sure you discuss any questions you have with your health care provider. Document Revised: 03/31/2017 Document Reviewed: 03/31/2017 [employer]vier Patient Education  2020 [employer]  

## 2020-01-10 DIAGNOSIS — Z961 Presence of intraocular lens: Secondary | ICD-10-CM | POA: Diagnosis not present

## 2020-01-11 NOTE — Progress Notes (Signed)
Subjective:   Susan Booth is a 86 y.o. female who presents for Medicare Annual (Subsequent) preventive examination.  I connected with Deniece Ree today by telephone and verified that I am speaking with the correct person using two identifiers. Location patient: home Location provider: work Persons participating in the virtual visit: patient, provider.   I discussed the limitations, risks, security and privacy concerns of performing an evaluation and management service by telephone and the availability of in person appointments. I also discussed with the patient that there may be a patient responsible charge related to this service. The patient expressed understanding and verbally consented to this telephonic visit.    Interactive audio and video telecommunications were attempted between this provider and patient, however failed, due to patient having technical difficulties OR patient did not have access to video capability.  We continued and completed visit with audio only.   Review of Systems    N/A  Cardiac Risk Factors include: advanced age (>20men, >3 women);hypertension     Objective:    There were no vitals filed for this visit. There is no height or weight on file to calculate BMI.  Advanced Directives 01/12/2020 01/07/2019 11/03/2017 11/18/2016 08/16/2016 11/24/2015 11/22/2015  Does Patient Have a Medical Advance Directive? No No No No No No No  Would patient like information on creating a medical advance directive? No - Patient declined No - Patient declined No - Patient declined - - No - patient declined information -    Current Medications (verified) Outpatient Encounter Medications as of 01/12/2020  Medication Sig  . amoxicillin (AMOXIL) 500 MG tablet Take 500 mg by mouth. Take 1 tablet one hour prior to dental work.  [name] aspirin EC 81 MG tablet Take 81 mg by mouth daily.  . Calcium Carbonate-Vitamin D 600-400 MG-UNIT per tablet CALTRATE 600+D, 600-400MG -UNIT  (Oral Tablet)  1 Every Day for 0 days  Quantity: 0.00;  Refills: 0   Ordered :12-Jan-2010  [name] ;  Started 15-Aug-2008 Active Comments: DX: 733.90  . clobetasol cream (TEMOVATE) 0.05 % APPLY ONE APPLICATION TOPICALLY TWO TIMES A WEEK (Patient taking differently: as needed. )  . fluticasone (FLONASE) 50 MCG/ACT nasal spray Place 1 spray into both nostrils daily as needed.  . hydrochlorothiazide (HYDRODIURIL) 25 MG tablet Take 1 tablet (25 mg total) by mouth daily.  [name] LORazepam (ATIVAN) 0.5 MG tablet Take 1 tablet (0.5 mg total)by mouth 2 (two) times daily as needed for anxiety.  [name] losartan (COZAAR) 100 MG tablet Take 1 tablet (100 mg total) by mouth daily.  . potassium chloride SA (KLOR-CON) 20 MEQ tablet TAKE ONE (1) TABLET BY MOUTH TWO (2) TIMES DAILY  . amLODipine (NORVASC) 2.5 MG tablet Take 1 tablet (2.5 mg total) by mouth daily. (Patient not taking: Reported on 01/12/2020)  . conjugated estrogens (PREMARIN) vaginal cream Apply dime-sized amount to urethra and vagina twice weekly at bedtime. (Patient not taking: Reported on 01/12/2020)  . nystatin cream (MYCOSTATIN) Apply to affected area 2 times daily (Patient not taking: Reported on 01/12/2020)   No facility-administered encounter medications on file as of 01/12/2020.    Allergies (verified) Patient has no known allergies.   History: Past Medical History:  Diagnosis Date  . Anxiety   . Benign essential tremor   . Complication of anesthesia    nausea  . Gastroesophageal reflux disease   . Hypertension   . Lichen sclerosus et atrophicus of the vulva   . Murmur   . Need for SBE (  subacute bacterial endocarditis) prophylaxis   . Osteoporosis, post-menopausal   . Other chest pain   . Paresthesia   . Rhinitis, allergic    Past Surgical History:  Procedure Laterality Date  . ABDOMINAL HYSTERECTOMY  1968  . ASD REPAIR, SECUNDUM  03/2007  . CATARACT EXTRACTION Bilateral 06/2012  . CORRECTION HAMMER TOE Bilateral 2003  .  hole in heart repaired     continue to have some leakage  . ORIF PATELLA Left 11/24/2015   Procedure: OPEN REDUCTION INTERNAL (ORIF) FIXATION PATELLA;  Surgeon: Juanell FairlyKevin Krasinski, MD;  Location: [facility] ORS;  Service: Orthopedics;  Laterality: Left;  (PER Dr Rico AlaKransinski Spinal would be ideal    Family History  Problem Relation Age of Onset  . COPD Mother   . Early death Mother   . Lung cancer Mother   . Heart disease Father   . Heart attack Father   . Heart disease Sister        heart valve problem  . Dementia Sister   . Breast cancer Maternal Aunt 70  . Prostate cancer Son   . Prostate cancer Son    Social History   Socioeconomic History  . Marital status: Married    Spouse name: [name]  . Number of children: 2  . Years of education: Some College  . Highest education level: Some college, no degree  Occupational History  . Occupation: Retired  Tobacco Use  . Smoking status: Never Smoker  . Smokeless tobacco: Never Used  Vaping Use  . Vaping Use: Never used  Substance and Sexual Activity  . Alcohol use: Yes    Comment: rare - 1 glass of wine  . Drug use: No  . Sexual activity: Never  Other Topics Concern  . Not on file  Social History Narrative  . Not on file   Social Determinants of Health   Financial Resource Strain: Low Risk   . Difficulty of Paying Living Expenses: Not hard at all  Food Insecurity: No Food Insecurity  . Worried About Programme researcher, broadcasting/film/videounning Out of Food in the Last Year: Never true  . Ran Out of Food in the Last Year: Never true  Transportation Needs: No Transportation Needs  . Lack of Transportation (Medical): No  . Lack of Transportation (Non-Medical): No  Physical Activity: Inactive  . Days of Exercise per Week: 0 days  . Minutes of Exercise per Session: 0 min  Stress: Stress Concern Present  . Feeling of Stress : To some extent  Social Connections: Moderately Integrated  . Frequency of Communication with Friends and Family: More than three times a week  .  Frequency of Social Gatherings with Friends and Family: More than three times a week  . Attends Religious Services: More than 4 times per year  . Active Member of Clubs or Organizations: No  . Attends BankerClub or Organization Meetings: Never  . Marital Status: Married    Tobacco Counseling Counseling given: Not Answered   Clinical Intake:  Pre-visit preparation completed: Yes  Pain : No/denies pain     Nutritional Risks: None Diabetes: No  How often do you need to have someone help you when you read instructions, pamphlets, or other written materials from your doctor or pharmacy?: 1 - Never  Diabetic? No  Interpreter Needed?: No  Information entered by :: [facility] CenterMmarkoski, LPN   Activities of Daily Living In your present state of health, do you have any difficulty performing the following activities: 01/12/2020  Hearing? N  Comment Wears bilateral hearing  aids.  Vision? N  Difficulty concentrating or making decisions? N  Walking or climbing stairs? N  Dressing or bathing? N  Doing errands, shopping? N  Preparing Food and eating ? N  Using the Toilet? N  In the past six months, have you accidently leaked urine? Y  Comment Occasionally, wears protection when going out.  Do you have problems with loss of bowel control? N  Managing your Medications? N  Managing your Finances? N  Housekeeping or managing your Housekeeping? N  Some recent data might be hidden    Patient Care Team: Erasmo Downer, MD as PCP - General (Family Medicine) Dingeldein, Viviann Spare, MD as Consulting Physician (Ophthalmology) Lady Gary Darlin Priestly, MD as Consulting Physician (Cardiology) Hildred Laser, MD as Referring Physician (Obstetrics and Gynecology)  Indicate any recent Medical Services you may have received from other than [facility] providers in the past year (date may be approximate).     Assessment:   This is a routine wellness examination for [place].  Hearing/Vision screen No exam data  present  Dietary issues and exercise activities discussed: Current Exercise Habits: The patient does not participate in regular exercise at present, Exercise limited by: None identified  Goals    . DIET - INCREASE WATER INTAKE     Recommend increasing water intake to 4-6 glasses a day.       Depression Screen PHQ 2/9 Scores 01/12/2020 06/29/2019 01/07/2019 11/03/2017 11/18/2016 09/11/2015 09/09/2014  PHQ - 2 Score 0 3 0 0 0 0 0  PHQ- 9 Score - 11 - - - - -    Fall Risk Fall Risk  01/12/2020 01/07/2019 11/03/2017 11/18/2016 09/11/2015  Falls in the past year? 0 0 Yes No No  Number falls in past yr: 0 0 1 - -  Injury with Fall? 0 0 Yes - -  Comment - - sprained ankle - -  Follow up - - Falls prevention discussed - -    Any stairs in or around the home? Yes  If so, are there any without handrails? No  Home free of loose throw rugs in walkways, pet beds, electrical cords, etc? Yes  Adequate lighting in your home to reduce risk of falls? Yes   ASSISTIVE DEVICES UTILIZED TO PREVENT FALLS:  Life alert? No  Use of a cane, walker or w/c? No  Grab bars in the bathroom? Yes  Shower chair or bench in shower? Yes  Elevated toilet seat or a handicapped toilet? Yes   Cognitive Function:     6CIT Screen 11/03/2017  What Year? 0 points  What month? 0 points  What time? 0 points  Count back from 20 0 points  Months in reverse 0 points  Repeat phrase 2 points  Total Score 2    Immunizations Immunization History  Administered Date(s) Administered  . Fluad Quad(high Dose 65+) 01/07/2019  . Influenza, High Dose Seasonal PF 01/05/2014, 12/22/2019  . Influenza,inj,quad, With Preservative 12/29/2017  . Influenza-Unspecified 12/29/2016, 12/31/2017  . PFIZER SARS-COV-2 Vaccination 04/07/2019, 04/28/2019  . Pneumococcal Conjugate-13 09/06/2013  . Pneumococcal Polysaccharide-23 02/08/2004  . Td 07/12/2003  . Zoster 09/29/2010  . Zoster Recombinat (Shingrix) 02/24/2018, 05/15/2018    TDAP  status: Due, Education has been provided regarding the importance of this vaccine. Advised may receive this vaccine at local pharmacy or Health Dept. Aware to provide a copy of the vaccination record if obtained from local pharmacy or Health Dept. Verbalized acceptance and understanding. Flu Vaccine status: Up to date Pneumococcal vaccine status: Up  to date Covid-19 vaccine status: Completed vaccines  Qualifies for Shingles Vaccine? Yes   Zostavax completed Yes   Shingrix Completed?: Yes  Screening Tests Health Maintenance  Topic Date Due  . DEXA SCAN  01/11/2021 (Originally 09/28/2015)  . TETANUS/TDAP  01/01/2023 (Originally 07/11/2013)  . INFLUENZA VACCINE  Completed  . COVID-19 Vaccine  Completed  . PNA vac Low Risk Adult  Completed    Health Maintenance  There are no preventive care reminders to display for this patient.  Colorectal cancer screening: No longer required.  Mammogram status: No longer required.  Bone Density status: Currently due. Declined order at this time.   Lung Cancer Screening: (Low Dose CT Chest recommended if Age 49-80 years, 30 pack-year currently smoking OR have quit w/in 15years.) does not qualify.    Additional Screening:  Vision Screening: Recommended annual ophthalmology exams for early detection of glaucoma and other disorders of the eye. Is the patient up to date with their annual eye exam?  Yes  Who is the provider or what is the name of the office in which the patient attends annual eye exams? Dr Dingeldein @ [facility] If pt is not established with a provider, would they like to be referred to a provider to establish care? No .   Dental Screening: Recommended annual dental exams for proper oral hygiene  Community Resource Referral / Chronic Care Management: CRR required this visit?  No   CCM required this visit?  No      Plan:     I have personally reviewed and noted the following in the patient's chart:   . Medical and social  history . Use of alcohol, tobacco or illicit drugs  . Current medications and supplements . Functional ability and status . Nutritional status . Physical activity . Advanced directives . List of other physicians . Hospitalizations, surgeries, and ER visits in previous 12 months . Vitals . Screenings to include cognitive, depression, and falls . Referrals and appointments  In addition, I have reviewed and discussed with patient certain preventive protocols, quality metrics, and best practice recommendations. A written personalized care plan for preventive services as well as general preventive health recommendations were provided to patient.     Sandria Mcenroe [place], [place]   [id number]   Nurse Notes: Declined a DEXA scan at this time.

## 2020-01-12 ENCOUNTER — Encounter: Payer: Medicare Other | Admitting: Family Medicine

## 2020-01-12 ENCOUNTER — Ambulatory Visit (INDEPENDENT_AMBULATORY_CARE_PROVIDER_SITE_OTHER): Payer: Medicare PPO

## 2020-01-12 ENCOUNTER — Other Ambulatory Visit: Payer: Self-pay

## 2020-01-12 DIAGNOSIS — Z0000 Encounter for general adult medical examination without abnormal findings: Secondary | ICD-10-CM | POA: Diagnosis not present

## 2020-01-12 NOTE — Patient Instructions (Signed)
Susan Booth , Thank you for taking time to come for your Medicare Wellness Visit. I appreciate your ongoing commitment to your health goals. Please review the following plan we discussed and let me know if I can assist you in the future.   Screening recommendations/referrals: Colonoscopy: No longer required.  Mammogram: No longer required.  Bone Density: Currently due. Declined order at this time. Recommended yearly ophthalmology/optometry visit for glaucoma screening and checkup Recommended yearly dental visit for hygiene and checkup  Vaccinations: Influenza vaccine: Done 12/2019 Pneumococcal vaccine: Completed series Tdap vaccine: Currently due, declined at this time. Shingles vaccine: Completed series    Advanced directives: Advance directive discussed with you today. Even though you declined this today please call our office should you change your mind and we can give you the proper paperwork for you to fill out.  Conditions/risks identified: Recommend increasing water intake to 6-8 8 oz glasses a day.   Next appointment: 01/28/20 @ 10:00 AM with Dr Beryle Flock    Preventive Care 65 Years and Older, Female Preventive care refers to lifestyle choices and visits with your health care provider that can promote health and wellness. What does preventive care include?  A yearly physical exam. This is also called an annual well check.  Dental exams once or twice a year.  Routine eye exams. Ask your health care provider how often you should have your eyes checked.  Personal lifestyle choices, including:  Daily care of your teeth and gums.  Regular physical activity.  Eating a healthy diet.  Avoiding tobacco and drug use.  Limiting alcohol use.  Practicing safe sex.  Taking low-dose aspirin every day.  Taking vitamin and mineral supplements as recommended by your health care provider. What happens during an annual well check? The services and screenings done by your  health care provider during your annual well check will depend on your age, overall health, lifestyle risk factors, and family history of disease. Counseling  Your health care provider may ask you questions about your:  Alcohol use.  Tobacco use.  Drug use.  Emotional well-being.  Home and relationship well-being.  Sexual activity.  Eating habits.  History of falls.  Memory and ability to understand (cognition).  Work and work Astronomer.  Reproductive health. Screening  You may have the following tests or measurements:  Height, weight, and BMI.  Blood pressure.  Lipid and cholesterol levels. These may be checked every 5 years, or more frequently if you are over 64 years old.  Skin check.  Lung cancer screening. You may have this screening every year starting at age 88 if you have a 30-pack-year history of smoking and currently smoke or have quit within the past 15 years.  Fecal occult blood test (FOBT) of the stool. You may have this test every year starting at age 56.  Flexible sigmoidoscopy or colonoscopy. You may have a sigmoidoscopy every 5 years or a colonoscopy every 10 years starting at age 63.  Hepatitis C blood test.  Hepatitis B blood test.  Sexually transmitted disease (STD) testing.  Diabetes screening. This is done by checking your blood sugar (glucose) after you have not eaten for a while (fasting). You may have this done every 1-3 years.  Bone density scan. This is done to screen for osteoporosis. You may have this done starting at age 19.  Mammogram. This may be done every 1-2 years. Talk to your health care provider about how often you should have regular mammograms. Talk with your health  care provider about your test results, treatment options, and if necessary, the need for more tests. Vaccines  Your health care provider may recommend certain vaccines, such as:  Influenza vaccine. This is recommended every year.  Tetanus, diphtheria, and  acellular pertussis (Tdap, Td) vaccine. You may need a Td booster every 10 years.  Zoster vaccine. You may need this after age 16.  Pneumococcal 13-valent conjugate (PCV13) vaccine. One dose is recommended after age 35.  Pneumococcal polysaccharide (PPSV23) vaccine. One dose is recommended after age 65. Talk to your health care provider about which screenings and vaccines you need and how often you need them. This information is not intended to replace advice given to you by your health care provider. Make sure you discuss any questions you have with your health care provider. Document Released: 04/14/2015 Document Revised: 12/06/2015 Document Reviewed: 01/17/2015 Elsevier Interactive Patient Education  2017 [place] Prevention in the Home Falls can cause injuries. They can happen to people of all ages. There are many things you can do to make your home safe and to help prevent falls. What can I do on the outside of my home?  Regularly fix the edges of walkways and driveways and fix any cracks.  Remove anything that might make you trip as you walk through a door, such as a raised step or threshold.  Trim any bushes or trees on the path to your home.  Use bright outdoor lighting.  Clear any walking paths of anything that might make someone trip, such as rocks or tools.  Regularly check to see if handrails are loose or broken. Make sure that both sides of any steps have handrails.  Any raised decks and porches should have guardrails on the edges.  Have any leaves, snow, or ice cleared regularly.  Use sand or salt on walking paths during winter.  Clean up any spills in your garage right away. This includes oil or grease spills. What can I do in the bathroom?  Use night lights.  Install grab bars by the toilet and in the tub and shower. Do not use towel bars as grab bars.  Use non-skid mats or decals in the tub or shower.  If you need to sit down in the shower, use  a plastic, non-slip stool.  Keep the floor dry. Clean up any water that spills on the floor as soon as it happens.  Remove soap buildup in the tub or shower regularly.  Attach bath mats securely with double-sided non-slip rug tape.  Do not have throw rugs and other things on the floor that can make you trip. What can I do in the bedroom?  Use night lights.  Make sure that you have a light by your bed that is easy to reach.  Do not use any sheets or blankets that are too big for your bed. They should not hang down onto the floor.  Have a firm chair that has side arms. You can use this for support while you get dressed.  Do not have throw rugs and other things on the floor that can make you trip. What can I do in the kitchen?  Clean up any spills right away.  Avoid walking on wet floors.  Keep items that you use a lot in easy-to-reach places.  If you need to reach something above you, use a strong step stool that has a grab bar.  Keep electrical cords out of the way.  Do not use floor  polish or wax that makes floors slippery. If you must use wax, use non-skid floor wax.  Do not have throw rugs and other things on the floor that can make you trip. What can I do with my stairs?  Do not leave any items on the stairs.  Make sure that there are handrails on both sides of the stairs and use them. Fix handrails that are broken or loose. Make sure that handrails are as long as the stairways.  Check any carpeting to make sure that it is firmly attached to the stairs. Fix any carpet that is loose or worn.  Avoid having throw rugs at the top or bottom of the stairs. If you do have throw rugs, attach them to the floor with carpet tape.  Make sure that you have a light switch at the top of the stairs and the bottom of the stairs. If you do not have them, ask someone to add them for you. What else can I do to help prevent falls?  Wear shoes that:  Do not have high heels.  Have  rubber bottoms.  Are comfortable and fit you well.  Are closed at the toe. Do not wear sandals.  If you use a stepladder:  Make sure that it is fully opened. Do not climb a closed stepladder.  Make sure that both sides of the stepladder are locked into place.  Ask someone to hold it for you, if possible.  Clearly mark and make sure that you can see:  Any grab bars or handrails.  First and last steps.  Where the edge of each step is.  Use tools that help you move around (mobility aids) if they are needed. These include:  Canes.  Walkers.  Scooters.  Crutches.  Turn on the lights when you go into a dark area. Replace any light bulbs as soon as they burn out.  Set up your furniture so you have a clear path. Avoid moving your furniture around.  If any of your floors are uneven, fix them.  If there are any pets around you, be aware of where they are.  Review your medicines with your doctor. Some medicines can make you feel dizzy. This can increase your chance of falling. Ask your doctor what other things that you can do to help prevent falls. This information is not intended to replace advice given to you by your health care provider. Make sure you discuss any questions you have with your health care provider. Document Released: 01/12/2009 Document Revised: 08/24/2015 Document Reviewed: 04/22/2014 Elsevier Interactive Patient Education  2017 [employer].

## 2020-01-27 DIAGNOSIS — I351 Nonrheumatic aortic (valve) insufficiency: Secondary | ICD-10-CM | POA: Diagnosis not present

## 2020-01-27 DIAGNOSIS — I10 Essential (primary) hypertension: Secondary | ICD-10-CM | POA: Diagnosis not present

## 2020-01-27 DIAGNOSIS — I491 Atrial premature depolarization: Secondary | ICD-10-CM | POA: Diagnosis not present

## 2020-01-28 ENCOUNTER — Encounter: Payer: Self-pay | Admitting: Family Medicine

## 2020-01-28 ENCOUNTER — Other Ambulatory Visit: Payer: Self-pay

## 2020-01-28 ENCOUNTER — Ambulatory Visit (INDEPENDENT_AMBULATORY_CARE_PROVIDER_SITE_OTHER): Payer: Medicare PPO | Admitting: Family Medicine

## 2020-01-28 VITALS — BP 163/77 | HR 71 | Temp 98.0°F | Wt 104.0 lb

## 2020-01-28 DIAGNOSIS — R739 Hyperglycemia, unspecified: Secondary | ICD-10-CM

## 2020-01-28 DIAGNOSIS — K5904 Chronic idiopathic constipation: Secondary | ICD-10-CM

## 2020-01-28 DIAGNOSIS — I270 Primary pulmonary hypertension: Secondary | ICD-10-CM | POA: Diagnosis not present

## 2020-01-28 DIAGNOSIS — Z0000 Encounter for general adult medical examination without abnormal findings: Secondary | ICD-10-CM | POA: Diagnosis not present

## 2020-01-28 DIAGNOSIS — R636 Underweight: Secondary | ICD-10-CM | POA: Diagnosis not present

## 2020-01-28 DIAGNOSIS — I10 Essential (primary) hypertension: Secondary | ICD-10-CM

## 2020-01-28 NOTE — Progress Notes (Signed)
Annual Wellness Visit     Patient: Susan Booth, Female    DOB: 09-12-1930, 86 y.o.   MRN: [id number] Visit Date: 01/28/2020  Today's Provider: Shirlee Latch, MD   Chief Complaint  Patient presents with  . Annual Exam   Subjective    Susan Booth is a 86 y.o. female who presents today for her Annual Wellness Visit. She reports consuming a general diet.  She generally feels well. She reports sleeping fairly well. She does have additional problems to discuss today.   HPI  Alternating constipation and diarrhea for 6 months or [place].  Also has a lot of gas. Loud bowel sounds. Sometimes loose or sometimes requires straining.  Tried Metamucil once daily with some improvement.    Patient Active Problem List   Diagnosis Date Noted  . Underweight 01/07/2019  . Leg cramps 09/29/2018  . Jittery 11/24/2017  . Rectal burning 11/11/2017  . Constipation 11/11/2017  . Fatigue 04/29/2016  . Patella fracture 11/24/2015  . Rectal itching 07/04/2015  . Heart palpitations 11/25/2014  . Lichen sclerosus et atrophicus of the vulva   . Atypical chest pain 08/26/2014  . Paresthesia 08/26/2014  . Allergic rhinitis 07/22/2014  . Anxiety 07/22/2014  . Benign essential tremor 07/22/2014  . Acid reflux 07/22/2014  . OP (osteoporosis) 07/22/2014  . Bone/cartilage disorder 08/19/2008  . Nonrheumatic tricuspid valve disorder 08/19/2008  . Colon, diverticulosis 08/15/2008  . Benign hypertension 08/15/2008  . Primary pulmonary hypertension (HCC) 08/15/2008  . H/O total hysterectomy 04/02/1967   Past Medical History:  Diagnosis Date  . Anxiety   . Benign essential tremor   . Complication of anesthesia    nausea  . Gastroesophageal reflux disease   . Hypertension   . Lichen sclerosus et atrophicus of the vulva   . Murmur   . Need for SBE (subacute bacterial endocarditis) prophylaxis   . Osteoporosis, post-menopausal   . Other chest pain   . Paresthesia   . Rhinitis,  allergic    Social History   Tobacco Use  . Smoking status: Never Smoker  . Smokeless tobacco: Never Used  Vaping Use  . Vaping Use: Never used  Substance Use Topics  . Alcohol use: Yes    Comment: rare - 1 glass of wine  . Drug use: No   No Known Allergies   Medications: Outpatient Medications Prior to Visit  Medication Sig  . amoxicillin (AMOXIL) 500 MG tablet Take 500 mg by mouth. Take 1 tablet one hour prior to dental work.  [name] aspirin EC 81 MG tablet Take 81 mg by mouth daily.  . Calcium Carbonate-Vitamin D 600-400 MG-UNIT per tablet CALTRATE 600+D, 600-400MG -UNIT (Oral Tablet)  1 Every Day for 0 days  Quantity: 0.00;  Refills: 0   Ordered :12-Jan-2010  [name] ;  Started 15-Aug-2008 Active Comments: DX: 733.90  . clobetasol cream (TEMOVATE) 0.05 % APPLY ONE APPLICATION TOPICALLY TWO TIMES A WEEK (Patient taking differently: as needed. )  . fluticasone (FLONASE) 50 MCG/ACT nasal spray Place 1 spray into both nostrils daily as needed.  . hydrochlorothiazide (HYDRODIURIL) 25 MG tablet Take 1 tablet (25 mg total) by mouth daily. (Patient taking differently: Take 37.5 mg by mouth daily. )  . LORazepam (ATIVAN) 0.5 MG tablet Take 1 tablet (0.5 mg total)by mouth 2 (two) times daily as needed for anxiety.  [name] losartan (COZAAR) 100 MG tablet Take 1 tablet (100 mg total) by mouth daily.  [name] nystatin cream (MYCOSTATIN) Apply to affected  area 2 times daily  . potassium chloride SA (KLOR-CON) 20 MEQ tablet TAKE ONE (1) TABLET BY MOUTH TWO (2) TIMES DAILY  . [DISCONTINUED] amLODipine (NORVASC) 2.5 MG tablet Take 1 tablet (2.5 mg total) by mouth daily. (Patient not taking: Reported on 01/12/2020)  . [DISCONTINUED] conjugated estrogens (PREMARIN) vaginal cream Apply dime-sized amount to urethra and vagina twice weekly at bedtime. (Patient not taking: Reported on 01/12/2020)   No facility-administered medications prior to visit.    No Known Allergies  Patient Care Team: Erasmo Downer, MD as PCP - General (Family Medicine) Sallee Lange, MD as Consulting Physician (Ophthalmology) Lady Gary Darlin Priestly, MD as Consulting Physician (Cardiology) Hildred Laser, MD as Referring Physician (Obstetrics and Gynecology)  Review of Systems  Constitutional: Negative.   HENT: Negative.   Eyes: Negative.   Respiratory: Negative.   Cardiovascular: Negative.   Gastrointestinal: Positive for abdominal distention, constipation and diarrhea. Negative for abdominal pain, anal bleeding, blood in stool, nausea, rectal pain and vomiting.  Endocrine: Positive for cold intolerance. Negative for heat intolerance, polydipsia, polyphagia and polyuria.  Genitourinary: Negative.   Musculoskeletal: Negative.   Skin: Negative.   Allergic/Immunologic: Negative.   Neurological: Negative.   Hematological: Negative.   Psychiatric/Behavioral: Negative.       Objective    Vitals: BP (!) 163/77 (BP Location: Left Arm, Patient Position: Sitting, Cuff Size: Normal)   Pulse 71   Temp 98 F (36.7 C) (Oral)   Wt 104 lb (47.2 kg)   BMI 17.85 kg/m    Physical Exam Vitals reviewed.  Constitutional:      General: She is not in acute distress.    Appearance: Normal appearance. She is well-developed. She is not diaphoretic.  HENT:     Head: Normocephalic and atraumatic.     Right Ear: Tympanic membrane, ear canal and external ear normal.     Left Ear: Tympanic membrane, ear canal and external ear normal.  Eyes:     General: No scleral icterus.    Conjunctiva/sclera: Conjunctivae normal.     Pupils: Pupils are equal, round, and reactive to light.  Neck:     Thyroid: No thyromegaly.  Cardiovascular:     Rate and Rhythm: Normal rate and regular rhythm.     Pulses: Normal pulses.     Heart sounds: Murmur heard.   Pulmonary:     Effort: Pulmonary effort is normal. No respiratory distress.     Breath sounds: Normal breath sounds. No wheezing or rales.  Abdominal:     General: There is no  distension.     Palpations: Abdomen is soft.     Tenderness: There is no abdominal tenderness.  Musculoskeletal:        General: No deformity.     Cervical back: Neck supple.     Right lower leg: No edema.     Left lower leg: No edema.  Lymphadenopathy:     Cervical: No cervical adenopathy.  Skin:    General: Skin is warm and dry.     Findings: No rash.  Neurological:     Mental Status: She is alert and oriented to person, place, and time. Mental status is at baseline.     Gait: Gait normal.  Psychiatric:        Mood and Affect: Mood normal.        Behavior: Behavior normal.        Thought Content: Thought content normal.      Most recent functional status assessment: In  your present state of health, do you have any difficulty performing the following activities: 01/28/2020  Hearing? Y  Comment -  Vision? N  Difficulty concentrating or making decisions? N  Walking or climbing stairs? N  Dressing or bathing? N  Doing errands, shopping? N  Preparing Food and eating ? -  Using the Toilet? -  In the past six months, have you accidently leaked urine? -  Comment -  Do you have problems with loss of bowel control? -  Managing your Medications? -  Managing your Finances? -  Housekeeping or managing your Housekeeping? -  Some recent data might be hidden   Most recent fall risk assessment: Fall Risk  01/28/2020  Falls in the past year? 0  Number falls in past yr: 0  Injury with Fall? 0  Comment -  Risk for fall due to : No Fall Risks  Follow up Falls evaluation completed    Most recent depression screenings: PHQ 2/9 Scores 01/28/2020 01/12/2020  PHQ - 2 Score 0 0  PHQ- 9 Score 1 -   Most recent cognitive screening: 6CIT Screen 11/03/2017  What Year? 0 points  What month? 0 points  What time? 0 points  Count back from 20 0 points  Months in reverse 0 points  Repeat phrase 2 points  Total Score 2   Most recent Audit-C alcohol use screening Alcohol Use Disorder  Test (AUDIT) 01/28/2020  1. How often do you have a drink containing alcohol? 1  2. How many drinks containing alcohol do you have on a typical day when you are drinking? 0  3. How often do you have six or more drinks on one occasion? 0  AUDIT-C Score 1  Alcohol Brief Interventions/Follow-up AUDIT Score <7 follow-up not indicated   A score of 3 or more in women, and 4 or more in men indicates increased risk for alcohol abuse, EXCEPT if all of the points are from question 1   No results found for any visits on 01/28/20.  Assessment & Plan     Annual wellness visit done today including the all of the following: Reviewed patient's Family Medical History Reviewed and updated list of patient's medical providers Assessment of cognitive impairment was done Assessed patient's functional ability Established a written schedule for health screening services Health Risk Assessent Completed and Reviewed  Exercise Activities and Dietary recommendations Goals    . DIET - INCREASE WATER INTAKE     Recommend increasing water intake to 4-6 glasses a day.        Immunization History  Administered Date(s) Administered  . Fluad Quad(high Dose 65+) 01/07/2019  . Influenza, High Dose Seasonal PF 01/05/2014, 12/22/2019  . Influenza,inj,quad, With Preservative 12/29/2017  . Influenza-Unspecified 12/29/2016, 12/31/2017  . PFIZER SARS-COV-2 Vaccination 04/07/2019, 04/28/2019  . Pneumococcal Conjugate-13 09/06/2013  . Pneumococcal Polysaccharide-23 02/08/2004  . Td 07/12/2003  . Zoster 09/29/2010  . Zoster Recombinat (Shingrix) 02/24/2018, 05/15/2018    Health Maintenance  Topic Date Due  . DEXA SCAN  01/11/2021 (Originally 09/28/2015)  . TETANUS/TDAP  01/01/2023 (Originally 07/11/2013)  . INFLUENZA VACCINE  Completed  . COVID-19 Vaccine  Completed  . PNA vac Low Risk Adult  Completed     Discussed health benefits of physical activity, and encouraged her to engage in regular exercise  appropriate for her age and condition.    Problem List Items Addressed This Visit      Cardiovascular and Mediastinum   Benign hypertension    Uncontrolled, elevated Managed  by cardiology Recent dose increase of HCTZ Will not make further changes today Recheck metabolic panel She is following up with cardiology in 2 weeks      Relevant Orders   Lipid panel   Comprehensive metabolic panel   Primary pulmonary hypertension (HCC)    Followed by cardiology        Other   Constipation    Longstanding issue She was previously doing well with Metamucil Now having some leak around loose stools Start MiraLAX daily and titrate to 1 soft bowel movement daily Discussed increasing fiber in diet      Underweight    Encourage high-protein diet Discussed importance of normal weight and risks of being underweight       Other Visit Diagnoses    Encounter for annual physical exam    -  Primary   Relevant Orders   Lipid panel   Comprehensive metabolic panel   Hemoglobin A1c   Hyperglycemia       Relevant Orders   Hemoglobin A1c       Return in about 6 months (around 07/28/2020) for chronic disease f/u.     I, Shirlee LatchAngela Darryel Diodato, MD, have reviewed all documentation for this visit. The documentation on 01/28/20 for the exam, diagnosis, procedures, and orders are all accurate and complete.   Gertude Benito, Marzella SchleinAngela M, MD, MPH [facility]

## 2020-01-28 NOTE — Assessment & Plan Note (Addendum)
Uncontrolled, elevated Managed by cardiology Recent dose increase of HCTZ Will not make further changes today Recheck metabolic panel She is following up with cardiology in 2 weeks

## 2020-01-28 NOTE — Assessment & Plan Note (Signed)
Followed by cardiology 

## 2020-01-28 NOTE — Assessment & Plan Note (Signed)
Longstanding issue She was previously doing well with Metamucil Now having some leak around loose stools Start MiraLAX daily and titrate to 1 soft bowel movement daily Discussed increasing fiber in diet

## 2020-01-28 NOTE — Patient Instructions (Addendum)
Chronic Constipation  Chronic constipation is a condition in which a person has three or fewer bowel movements a week, for three months or longer. This condition is especially common in older adults. The two main kinds of chronic constipation are secondary constipation and functional constipation. Secondary constipation results from another condition or a treatment. Functional constipation, also called primary or idiopathic constipation, is divided into three types:  Normal transit constipation. In this type, movement of stool through the colon (stool transit) occurs normally.  Slow transit constipation. In this type, stool moves slowly through the colon.  Outlet constipation or pelvic floor dysfunction. In this type, the nerves and muscles that empty the rectum do not work normally. What are the causes? Causes of secondary constipation may include:  Failing to drink enough fluid, eat enough food or fiber, or get physically active.  Pregnancy.  A tear in the anus (anal fissure).  Blockage in the bowel (bowel obstruction).  Narrowing of the bowel (bowel stricture).  Having a long-term medical condition, such as: ? Diabetes. ? Hypothyroidism. ? Multiple sclerosis. ? Parkinson disease. ? Stroke. ? Spinal cord injury. ? Dementia. ? Colon cancer. ? Inflammatory bowel disease (IBD). ? Iron-deficiency anemia. ? Outward collapse of the rectum (rectal prolapse). ? Hemorrhoids.  Taking certain medicines, including: ? Narcotics. These are a certain type of prescription pain medicine. ? Antacids. ? Iron supplements. ? Water pills (diuretics). ? Certain blood pressure medicines. ? Anti-seizure medicines. ? Antidepressants. ? Medicines for Parkinson disease. The cause of functional constipation is not known, but some conditions are associated with it. These conditions include:  Stress.  Problems in the nerves and muscles that control stool transit.  Weak or impaired pelvic floor  muscles. What increases the risk? You may be at higher risk for chronic constipation if you:  Are older than age 70.  Are female.  Live in a long-term care facility.  Do not get much exercise or physical activity (have a sedentary lifestyle).  Do not drink enough fluids.  Do not eat enough food, especially fiber.  Have a long-term disease.  Have a mental health disorder or eating disorder.  Take many medicines. What are the signs or symptoms? The main symptom of chronic constipation is having three or fewer bowel movements a week for several weeks. Other signs and symptoms may vary from person to person. These include:  Pushing hard (straining) to pass stool.  Painful bowel movements.  Having hard or lumpy stools.  Having lower belly discomfort, such as cramps or bloating.  Being unable to have a bowel movement when you feel the urge.  Feeling like you still need to pass stool after a bowel movement.  Feeling that you have something in your rectum that is blocking or preventing bowel movements.  Seeing blood on the toilet paper or in your stool.  Worsening confusion (in older adults). How is this diagnosed? This condition may be diagnosed based on:  Symptoms and medical history. You will be asked about your symptoms, lifestyle, diet, and any medicines that you are taking.  Physical exam. ? Your belly (abdomen) will be examined. ? A digital rectal exam may be done. For this exam, a health care provider places a lubricated, gloved finger into the rectum.  Other tests to check for any underlying causes of your constipation. These may be ordered if you have bleeding in your rectum, weight loss, or a family history of colon cancer. In these cases, you may have: ? Imaging studies of   the colon. These may include X-ray, ultrasound, or CT scan. ? Blood tests. ? A procedure to examine the inside of your colon (colonoscopy). ? More specialized tests to check:  Whether  your anal sphincter works well. This is a ring-shaped muscle that controls the closing of the anus.  How well food moves through your colon. ? Tests to measure the nerve signal in your pelvic floor muscles (electromyography). How is this treated? Treatment for chronic constipation depends on the cause. Most often, treatment starts with:  Being more active and getting regular exercise.  Drinking more fluids.  Adding fiber to your diet. Sources of fiber include fruits, vegetables, whole grains, and fiber supplements.  Using medicines such as stool softeners or medicines that increase contractions in your digestive system (pro-motility agents).  Training your pelvic muscles with biofeedback.  Surgery, if there is obstruction. Treatment for secondary chronic constipation depends on the underlying condition. You may need to:  Stop or change some medicines if they cause constipation.  Use a fiber supplement (bulk laxative) or stool softener.  Use prescription laxative. This works by absorbing water into your colon (osmotic laxative). You may also need to see a specialist who treats conditions of the digestive system (gastroenterologist). Follow these instructions at home:   Take over-the-counter and prescription medicines only as told by your health care provider.  If you are taking a laxative, take it as told by your health care provider.  Eat a balanced diet that includes enough fiber. Ask your health care provider to recommend a diet that is right for you.  Drink clear fluids, especially water. Avoid drinking alcohol, caffeine, and soda.  Drink enough fluid to keep your urine pale yellow.  Get some physical activity every day. Ask your health care provider what physical activities are safe for you.  Get colon cancer screenings as told by your health care provider.  Keep all follow-up visits as told by your health care provider. This is important. Contact a health care  provider if:  You are having three or fewer bowel movements a week.  Your stools are hard or lumpy.  You notice blood on the toilet paper or in your stool after you have a bowel movement.  You have unexplained weight loss.  You have rectum (rectal) pain.  You have stool leakage.  You experience nausea or vomiting. Get help right away if:  You have rectal bleeding or you pass blood clots.  You have severe rectal pain.  You have body tissue that pushes out (protrudes) from your anus.  You have severe pain or bloating (distension) in your abdomen.  You have vomiting that you cannot control. Summary  Chronic constipation is a condition in which a person has three or fewer bowel movements a week, for three months or longer.  You may have a higher risk for this condition if you are an older adult, or if you do not drink enough water or get enough physical activity (are sedentary).  Treatment for this condition depends on the cause. Most treatments for chronic constipation include adding fiber to your diet, drinking more fluids, and getting more physical activity. You may also need to treat any underlying medical conditions or stop or change certain medicines if they cause constipation.  If lifestyle changes do not relieve constipation, your health care provider may recommend taking a laxative. This information is not intended to replace advice given to you by your health care provider. Make sure you discuss any questions you   have with your health care provider. Document Revised: 02/28/2017 Document Reviewed: 12/03/2016 Elsevier Patient Education  [place] 65 Years and Older, Female Preventive care refers to lifestyle choices and visits with your health care provider that can promote health and wellness. This includes:  A yearly physical exam. This is also called an annual well check.  Regular dental and eye exams.  Immunizations.  Screening  for certain conditions.  Healthy lifestyle choices, such as diet and exercise. What can I expect for my preventive care visit? Physical exam Your health care provider will check:  Height and weight. These may be used to calculate body mass index (BMI), which is a measurement that tells if you are at a healthy weight.  Heart rate and blood pressure.  Your skin for abnormal spots. Counseling Your health care provider may ask you questions about:  Alcohol, tobacco, and drug use.  Emotional well-being.  Home and relationship well-being.  Sexual activity.  Eating habits.  History of falls.  Memory and ability to understand (cognition).  Work and work Statistician.  Pregnancy and menstrual history. What immunizations do I need?  Influenza (flu) vaccine  This is recommended every year. Tetanus, diphtheria, and pertussis (Tdap) vaccine  You may need a Td booster every 10 years. Varicella (chickenpox) vaccine  You may need this vaccine if you have not already been vaccinated. Zoster (shingles) vaccine  You may need this after age 33. Pneumococcal conjugate (PCV13) vaccine  One dose is recommended after age 55. Pneumococcal polysaccharide (PPSV23) vaccine  One dose is recommended after age 20. Measles, mumps, and rubella (MMR) vaccine  You may need at least one dose of MMR if you were born in 1957 or later. You may also need a second dose. Meningococcal conjugate (MenACWY) vaccine  You may need this if you have certain conditions. Hepatitis A vaccine  You may need this if you have certain conditions or if you travel or work in places where you may be exposed to hepatitis A. Hepatitis B vaccine  You may need this if you have certain conditions or if you travel or work in places where you may be exposed to hepatitis B. Haemophilus influenzae type b (Hib) vaccine  You may need this if you have certain conditions. You may receive vaccines as individual doses or as  more than one vaccine together in one shot (combination vaccines). Talk with your health care provider about the risks and benefits of combination vaccines. What tests do I need? Blood tests  Lipid and cholesterol levels. These may be checked every 5 years, or more frequently depending on your overall health.  Hepatitis C test.  Hepatitis B test. Screening  Lung cancer screening. You may have this screening every year starting at age 24 if you have a 30-pack-year history of smoking and currently smoke or have quit within the past 15 years.  Colorectal cancer screening. All adults should have this screening starting at age 27 and continuing until age 77. Your health care provider may recommend screening at age 44 if you are at increased risk. You will have tests every 1-10 years, depending on your results and the type of screening test.  Diabetes screening. This is done by checking your blood sugar (glucose) after you have not eaten for a while (fasting). You may have this done every 1-3 years.  Mammogram. This may be done every 1-2 years. Talk with your health care provider about how often you should have  regular mammograms.  BRCA-related cancer screening. This may be done if you have a family history of breast, ovarian, tubal, or peritoneal cancers. Other tests  Sexually transmitted disease (STD) testing.  Bone density scan. This is done to screen for osteoporosis. You may have this done starting at age 91. Follow these instructions at home: Eating and drinking  Eat a diet that includes fresh fruits and vegetables, whole grains, lean protein, and low-fat dairy products. Limit your intake of foods with high amounts of sugar, saturated fats, and salt.  Take vitamin and mineral supplements as recommended by your health care provider.  Do not drink alcohol if your health care provider tells you not to drink.  If you drink alcohol: ? Limit how much you have to 0-1 drink a day. ? Be  aware of how much alcohol is in your drink. In the [place] one drink equals one 12 oz bottle of beer (355 mL), one 5 oz glass of wine (148 mL), or one 1 oz glass of hard liquor (44 mL). Lifestyle  Take daily care of your teeth and gums.  Stay active. Exercise for at least 30 minutes on 5 or more days each week.  Do not use any products that contain nicotine or tobacco, such as cigarettes, e-cigarettes, and chewing tobacco. If you need help quitting, ask your health care provider.  If you are sexually active, practice safe sex. Use a condom or other form of protection in order to prevent STIs (sexually transmitted infections).  Talk with your health care provider about taking a low-dose aspirin or statin. What's next?  Go to your health care provider once a year for a well check visit.  Ask your health care provider how often you should have your eyes and teeth checked.  Stay up to date on all vaccines. This information is not intended to replace advice given to you by your health care provider. Make sure you discuss any questions you have with your health care provider. Document Revised: 03/12/2018 Document Reviewed: 03/12/2018 Elsevier Patient Education  2020 [employer].

## 2020-01-28 NOTE — Assessment & Plan Note (Signed)
Encourage high-protein diet Discussed importance of normal weight and risks of being underweight

## 2020-01-29 LAB — HEMOGLOBIN A1C
Est. average glucose Bld gHb Est-mCnc: 108 mg/dL
Hgb A1c MFr Bld: 5.4 % (ref 4.8–5.6)

## 2020-01-29 LAB — COMPREHENSIVE METABOLIC PANEL
ALT: 8 IU/L (ref 0–32)
AST: 20 IU/L (ref 0–40)
Albumin/Globulin Ratio: 1.8 (ref 1.2–2.2)
Albumin: 4.3 g/dL (ref 3.6–4.6)
Alkaline Phosphatase: 84 IU/L (ref 44–121)
BUN/Creatinine Ratio: 14 (ref 12–28)
BUN: 10 mg/dL (ref 8–27)
Bilirubin Total: 0.6 mg/dL (ref 0.0–1.2)
CO2: 22 mmol/L (ref 20–29)
Calcium: 9.9 mg/dL (ref 8.7–10.3)
Chloride: 102 mmol/L (ref 96–106)
Creatinine, Ser: 0.74 mg/dL (ref 0.57–1.00)
GFR calc Af Amer: 84 mL/min/{1.73_m2} (ref >59)
GFR calc non Af Amer: 73 mL/min/{1.73_m2} (ref >59)
Globulin, Total: 2.4 g/dL (ref 1.5–4.5)
Glucose: 96 mg/dL (ref 65–99)
Potassium: 3.8 mmol/L (ref 3.5–5.2)
Sodium: 138 mmol/L (ref 134–144)
Total Protein: 6.7 g/dL (ref 6.0–8.5)

## 2020-01-29 LAB — LIPID PANEL
Chol/HDL Ratio: 3.1 ratio (ref 0.0–4.4)
Cholesterol, Total: 173 mg/dL (ref 100–199)
HDL: 55 mg/dL (ref >39)
LDL Chol Calc (NIH): 109 mg/dL — ABNORMAL HIGH (ref 0–99)
Triglycerides: 44 mg/dL (ref 0–149)
VLDL Cholesterol Cal: 9 mg/dL (ref 5–40)

## 2020-01-31 ENCOUNTER — Telehealth: Payer: Self-pay

## 2020-01-31 NOTE — Telephone Encounter (Signed)
Pt advised.   Thanks,   -Janthony Holleman  

## 2020-01-31 NOTE — Telephone Encounter (Signed)
-----   Message from Erasmo Downer, MD sent at 01/31/2020 10:14 AM EDT ----- Normal/stable labs

## 2020-04-18 ENCOUNTER — Other Ambulatory Visit: Payer: Self-pay | Admitting: Family Medicine

## 2020-04-18 ENCOUNTER — Telehealth: Payer: Self-pay | Admitting: Family Medicine

## 2020-04-18 MED ORDER — LOSARTAN POTASSIUM 100 MG PO TABS
100.0000 mg | ORAL_TABLET | Freq: Every day | ORAL | 1 refills | Status: DC
Start: 2020-04-18 — End: 2020-04-18

## 2020-04-18 MED ORDER — VALSARTAN 160 MG PO TABS: 160.0000 mg | ORAL_TABLET | Freq: Every day | ORAL | 1 refills | Status: DC

## 2020-04-18 NOTE — Telephone Encounter (Signed)
[facility] faxed refill request for the following medications:  losartan (COZAAR) 100 MG tablet  Last Rx: 04/19/2019 LOV: 01/28/2020 NOV: 07/28/2020 Please advise. Thanks TNP

## 2020-07-03 ENCOUNTER — Other Ambulatory Visit: Payer: Self-pay | Admitting: Family Medicine

## 2020-07-03 DIAGNOSIS — E876 Hypokalemia: Secondary | ICD-10-CM

## 2020-07-03 NOTE — Telephone Encounter (Signed)
Requested Prescriptions  Pending Prescriptions Disp Refills  . potassium chloride SA (KLOR-CON) 20 MEQ tablet [Pharmacy Med Name: POTASSIUM CL ER 20 MEQ TABLET] 180 tablet 0    Sig: TAKE ONE (1) TABLET BY MOUTH TWO (2) TIMES DAILY     Endocrinology:  Minerals - Potassium Supplementation Passed - 07/03/2020 12:48 PM      Passed - K in normal range and within 360 days    Potassium  Date Value Ref Range Status  01/28/2020 3.8 3.5 - 5.2 mmol/L Final  02/21/2013 3.6 3.5 - 5.1 mmol/L Final         Passed - Cr in normal range and within 360 days    Creatinine  Date Value Ref Range Status  02/21/2013 0.77 0.60 - 1.30 mg/dL Final   Creatinine, Ser  Date Value Ref Range Status  01/28/2020 0.74 0.57 - 1.00 mg/dL Final         Passed - Valid encounter within last 12 months    Recent Outpatient Visits          5 months ago Encounter for annual physical exam   [facility], Marzella Schlein, [place]   1 year ago Benign hypertension   [facility], Marzella Schlein, [place]   1 year ago Constipation, unspecified constipation type   [facility] Trey Sailors, [place]   1 year ago Dysuria   [facility], Lavella Hammock, [place]   1 year ago Encounter for annual physical exam   [facility] [place], Marzella Schlein, [place]      Future Appointments            In 3 weeks [place], Marzella Schlein, MD [facility]

## 2020-07-12 DIAGNOSIS — I491 Atrial premature depolarization: Secondary | ICD-10-CM | POA: Diagnosis not present

## 2020-07-12 DIAGNOSIS — I10 Essential (primary) hypertension: Secondary | ICD-10-CM | POA: Diagnosis not present

## 2020-07-12 DIAGNOSIS — I351 Nonrheumatic aortic (valve) insufficiency: Secondary | ICD-10-CM | POA: Diagnosis not present

## 2020-07-12 DIAGNOSIS — I270 Primary pulmonary hypertension: Secondary | ICD-10-CM | POA: Diagnosis not present

## 2020-07-12 DIAGNOSIS — F419 Anxiety disorder, unspecified: Secondary | ICD-10-CM | POA: Diagnosis not present

## 2020-07-12 DIAGNOSIS — R0602 Shortness of breath: Secondary | ICD-10-CM | POA: Diagnosis not present

## 2020-07-12 DIAGNOSIS — R0789 Other chest pain: Secondary | ICD-10-CM | POA: Diagnosis not present

## 2020-07-12 DIAGNOSIS — R5383 Other fatigue: Secondary | ICD-10-CM | POA: Diagnosis not present

## 2020-07-13 DIAGNOSIS — R0602 Shortness of breath: Secondary | ICD-10-CM | POA: Diagnosis not present

## 2020-07-13 DIAGNOSIS — J449 Chronic obstructive pulmonary disease, unspecified: Secondary | ICD-10-CM | POA: Diagnosis not present

## 2020-07-27 NOTE — Patient Instructions (Addendum)
Try Hydrocortisone cream twice a day   Hypertension, Adult High blood pressure (hypertension) is when the force of blood pumping through the arteries is too strong. The arteries are the blood vessels that carry blood from the heart throughout the body. Hypertension forces the heart to work harder to pump blood and may cause arteries to become narrow or stiff. Untreated or uncontrolled hypertension can cause a heart attack, heart failure, a stroke, kidney disease, and other problems. A blood pressure reading consists of a higher number over a lower number. Ideally, your blood pressure should be below 120/80. The first ("top") number is called the systolic pressure. It is a measure of the pressure in your arteries as your heart beats. The second ("bottom") number is called the diastolic pressure. It is a measure of the pressure in your arteries as the heart relaxes. What are the causes? The exact cause of this condition is not known. There are some conditions that result in or are related to high blood pressure. What increases the risk? Some risk factors for high blood pressure are under your control. The following factors may make you more likely to develop this condition:  Smoking.  Having type 2 diabetes mellitus, high cholesterol, or both.  Not getting enough exercise or physical activity.  Being overweight.  Having too much fat, sugar, calories, or salt (sodium) in your diet.  Drinking too much alcohol. Some risk factors for high blood pressure may be difficult or impossible to change. Some of these factors include:  Having chronic kidney disease.  Having a family history of high blood pressure.  Age. Risk increases with age.  Race. You may be at higher risk if you are African American.  Gender. Men are at higher risk than women before age 72. After age 72, women are at higher risk than men.  Having obstructive sleep apnea.  Stress. What are the signs or symptoms? High blood  pressure may not cause symptoms. Very high blood pressure (hypertensive crisis) may cause:  Headache.  Anxiety.  Shortness of breath.  Nosebleed.  Nausea and vomiting.  Vision changes.  Severe chest pain.  Seizures. How is this diagnosed? This condition is diagnosed by measuring your blood pressure while you are seated, with your arm resting on a flat surface, your legs uncrossed, and your feet flat on the floor. The cuff of the blood pressure monitor will be placed directly against the skin of your upper arm at the level of your heart. It should be measured at least twice using the same arm. Certain conditions can cause a difference in blood pressure between your right and left arms. Certain factors can cause blood pressure readings to be lower or higher than normal for a short period of time:  When your blood pressure is higher when you are in a health care provider's office than when you are at home, this is called white coat hypertension. Most people with this condition do not need medicines.  When your blood pressure is higher at home than when you are in a health care provider's office, this is called masked hypertension. Most people with this condition may need medicines to control blood pressure. If you have a high blood pressure reading during one visit or you have normal blood pressure with other risk factors, you may be asked to:  Return on a different day to have your blood pressure checked again.  Monitor your blood pressure at home for 1 week or longer. If you are diagnosed  with hypertension, you may have other blood or imaging tests to help your health care provider understand your overall risk for other conditions. How is this treated? This condition is treated by making healthy lifestyle changes, such as eating healthy foods, exercising more, and reducing your alcohol intake. Your health care provider may prescribe medicine if lifestyle changes are not enough to get  your blood pressure under control, and if:  Your systolic blood pressure is above 130.  Your diastolic blood pressure is above 80. Your personal target blood pressure may vary depending on your medical conditions, your age, and other factors. Follow these instructions at home: Eating and drinking  Eat a diet that is high in fiber and potassium, and low in sodium, added sugar, and fat. An example eating plan is called the DASH (Dietary Approaches to Stop Hypertension) diet. To eat this way: ? Eat plenty of fresh fruits and vegetables. Try to fill one half of your plate at each meal with fruits and vegetables. ? Eat whole grains, such as whole-wheat pasta, brown rice, or whole-grain bread. Fill about one fourth of your plate with whole grains. ? Eat or drink low-fat dairy products, such as skim milk or low-fat yogurt. ? Avoid fatty cuts of meat, processed or cured meats, and poultry with skin. Fill about one fourth of your plate with lean proteins, such as fish, chicken without skin, beans, eggs, or tofu. ? Avoid pre-made and processed foods. These tend to be higher in sodium, added sugar, and fat.  Reduce your daily sodium intake. Most people with hypertension should eat less than 1,500 mg of sodium a day.  Do not drink alcohol if: ? Your health care provider tells you not to drink. ? You are pregnant, may be pregnant, or are planning to become pregnant.  If you drink alcohol: ? Limit how much you use to:  0-1 drink a day for women.  0-2 drinks a day for men. ? Be aware of how much alcohol is in your drink. In the [place] one drink equals one 12 oz bottle of beer (355 mL), one 5 oz glass of wine (148 mL), or one 1 oz glass of hard liquor (44 mL).   Lifestyle  Work with your health care provider to maintain a healthy body weight or to lose weight. Ask what an ideal weight is for you.  Get at least 30 minutes of exercise most days of the week. Activities may include walking, swimming, or  biking.  Include exercise to strengthen your muscles (resistance exercise), such as Pilates or lifting weights, as part of your weekly exercise routine. Try to do these types of exercises for 30 minutes at least 3 days a week.  Do not use any products that contain nicotine or tobacco, such as cigarettes, e-cigarettes, and chewing tobacco. If you need help quitting, ask your health care provider.  Monitor your blood pressure at home as told by your health care provider.  Keep all follow-up visits as told by your health care provider. This is important.   Medicines  Take over-the-counter and prescription medicines only as told by your health care provider. Follow directions carefully. Blood pressure medicines must be taken as prescribed.  Do not skip doses of blood pressure medicine. Doing this puts you at risk for problems and can make the medicine less effective.  Ask your health care provider about side effects or reactions to medicines that you should watch for. Contact a health care provider if you:  Think you are having a reaction to a medicine you are taking.  Have headaches that keep coming back (recurring).  Feel dizzy.  Have swelling in your ankles.  Have trouble with your vision. Get help right away if you:  Develop a severe headache or confusion.  Have unusual weakness or numbness.  Feel faint.  Have severe pain in your chest or abdomen.  Vomit repeatedly.  Have trouble breathing. Summary  Hypertension is when the force of blood pumping through your arteries is too strong. If this condition is not controlled, it may put you at risk for serious complications.  Your personal target blood pressure may vary depending on your medical conditions, your age, and other factors. For most people, a normal blood pressure is less than 120/80.  Hypertension is treated with lifestyle changes, medicines, or a combination of both. Lifestyle changes include losing weight, eating  a healthy, low-sodium diet, exercising more, and limiting alcohol. This information is not intended to replace advice given to you by your health care provider. Make sure you discuss any questions you have with your health care provider. Document Revised: 11/26/2017 Document Reviewed: 11/26/2017 Elsevier Patient Education  2021 [employer].

## 2020-07-28 ENCOUNTER — Encounter: Payer: Self-pay | Admitting: Family Medicine

## 2020-07-28 ENCOUNTER — Ambulatory Visit: Payer: Medicare PPO | Admitting: Family Medicine

## 2020-07-28 ENCOUNTER — Other Ambulatory Visit: Payer: Self-pay

## 2020-07-28 VITALS — BP 130/70 | HR 67 | Temp 97.7°F | Resp 16 | Ht 62.0 in | Wt 103.6 lb

## 2020-07-28 DIAGNOSIS — F419 Anxiety disorder, unspecified: Secondary | ICD-10-CM | POA: Diagnosis not present

## 2020-07-28 DIAGNOSIS — I10 Essential (primary) hypertension: Secondary | ICD-10-CM | POA: Diagnosis not present

## 2020-07-28 DIAGNOSIS — K5904 Chronic idiopathic constipation: Secondary | ICD-10-CM

## 2020-07-28 DIAGNOSIS — L290 Pruritus ani: Secondary | ICD-10-CM

## 2020-07-28 MED ORDER — LOSARTAN POTASSIUM 100 MG PO TABS: 100.0000 mg | ORAL_TABLET | Freq: Every day | ORAL | 3 refills | Status: DC

## 2020-07-28 MED ORDER — LORAZEPAM 0.5 MG PO TABS: ORAL_TABLET | ORAL | 2 refills | Status: DC

## 2020-07-28 NOTE — Assessment & Plan Note (Signed)
Trial of hydrocortisone BID

## 2020-07-28 NOTE — Assessment & Plan Note (Signed)
Chronic and previously well controlled Exacerbated in the setting of health issues in her husband and sister Continue lorazepam prn - discussed risks decliens SSRI Encourage therapy

## 2020-07-28 NOTE — Progress Notes (Signed)
Established patient visit   Patient: Susan Booth   DOB: 12-29-1930   87 y.o. Female  MRN: [id number] Visit Date: 07/28/2020  Today's healthcare provider: Shirlee Latch, MD   Chief Complaint  Patient presents with  . Hypertension  . Anxiety   Subjective    Hypertension Associated symptoms include anxiety. Pertinent negatives include no chest pain, headaches or shortness of breath.  Anxiety Patient reports no chest pain, dizziness, nausea or shortness of breath.      Hypertension, follow-up  BP Readings from Last 3 Encounters:  07/28/20 130/70  01/28/20 (!) 163/77  11/18/19 134/72   Wt Readings from Last 3 Encounters:  07/28/20 103 lb 9.6 oz (47 kg)  01/28/20 104 lb (47.2 kg)  11/18/19 106 lb 9.6 oz (48.4 kg)     She was last seen for hypertension 6 months ago.  BP at that visit was 163/77. Management since that visit includes no changes.  She reports excellent compliance with treatment. She is not having side effects.  She is following a Low Sodium diet. She is not exercising. She does not smoke.  Use of agents associated with hypertension: none.  Her blood pressure medication was switched to Valsartan but she would prefer to be switched back to Losartan. She reports that her current medication causes bilateral lower extremity edema and cramps occasionally. She states that she believes it is because of the higher Valsartan dosage.   Outside blood pressures are not being checked. Symptoms: No chest pain No chest pressure  No palpitations No syncope  Yes dyspnea No orthopnea  No paroxysmal nocturnal dyspnea No lower extremity edema   Pertinent labs: Lab Results  Component Value Date   CHOL 173 01/28/2020   HDL 55 01/28/2020   LDLCALC 109 (H) 01/28/2020   TRIG 44 01/28/2020   CHOLHDL 3.1 01/28/2020   Lab Results  Component Value Date   NA 138 01/28/2020   K 3.8 01/28/2020   CREATININE 0.74 01/28/2020   GFRNONAA 73 01/28/2020   GFRAA 84  01/28/2020   GLUCOSE 96 01/28/2020     The ASCVD Risk score (Goff DC Jr., et al., 2013) failed to calculate for the following reasons:   The 2013 ASCVD risk score is only valid for ages 29 to 62   --------------------------------------------------------------------------------------------------- Anxiety, Follow-up  She was last seen for anxiety 1 years ago. Changes made at last visit include no changes, continue Lorazepam as needed.   She reports excellent compliance with treatment. She reports excellent tolerance of treatment. She is not having side effects.   She feels her anxiety is mild and Unchanged since last visit. She is continuing to take her Lorazepam medication consistently. She denies any symptoms. Symptoms: No chest pain No difficulty concentrating  No dizziness Yes fatigue  No feelings of losing control Yes insomnia  No irritable No palpitations  No panic attacks No racing thoughts  Yes shortness of breath No sweating  No tremors/shakes      Constipation  She states that she is having constipation. The constipation is followed by a burning sensation. She requests to be prescribed cream to resolve these symptoms.   GAD-7 Results GAD-7 Generalized Anxiety Disorder Screening Tool 07/28/2020 06/29/2019  1. Feeling Nervous, Anxious, or on Edge 0 2  2. Not Being Able to Stop or Control Worrying 1 1  3. Worrying Too Much About Different Things 1 1  4. Trouble Relaxing 0 1  5. Being So Restless it's Hard To Sit Still  0 0  6. Becoming Easily Annoyed or Irritable 0 0  7. Feeling Afraid As If Something Awful Might Happen 0 0  Total GAD-7 Score 2 5  Difficulty At Work, Home, or Getting  Along With Others? Not difficult at all Not difficult at all    PHQ-9 Scores PHQ9 SCORE ONLY 07/28/2020 01/28/2020 01/12/2020  PHQ-9 Total Score 2 1 0    ---------------------------------------------------------------------------------------------------   Patient Active Problem  List   Diagnosis Date Noted  . Underweight 01/07/2019  . Leg cramps 09/29/2018  . Rectal burning 11/11/2017  . Constipation 11/11/2017  . Fatigue 04/29/2016  . Patella fracture 11/24/2015  . Rectal itching 07/04/2015  . Heart palpitations 11/25/2014  . Lichen sclerosus et atrophicus of the vulva   . Atypical chest pain 08/26/2014  . Paresthesia 08/26/2014  . Allergic rhinitis 07/22/2014  . Anxiety 07/22/2014  . Benign essential tremor 07/22/2014  . Acid reflux 07/22/2014  . OP (osteoporosis) 07/22/2014  . Bone/cartilage disorder 08/19/2008  . Nonrheumatic tricuspid valve disorder 08/19/2008  . Colon, diverticulosis 08/15/2008  . Benign hypertension 08/15/2008  . Primary pulmonary hypertension (HCC) 08/15/2008  . H/O total hysterectomy 04/02/1967   Social History   Tobacco Use  . Smoking status: Never Smoker  . Smokeless tobacco: Never Used  Vaping Use  . Vaping Use: Never used  Substance Use Topics  . Alcohol use: Yes    Comment: rare - 1 glass of wine  . Drug use: No   No Known Allergies     Medications: Outpatient Medications Prior to Visit  Medication Sig  . aspirin EC 81 MG tablet Take 81 mg by mouth daily.  . Calcium Carbonate-Vitamin D 600-400 MG-UNIT per tablet CALTRATE 600+D, 600-400MG -UNIT (Oral Tablet)  1 Every Day for 0 days  Quantity: 0.00;  Refills: 0   Ordered :12-Jan-2010  [name] ;  Started 15-Aug-2008 Active Comments: DX: 733.90  . clobetasol cream (TEMOVATE) 0.05 % APPLY ONE APPLICATION TOPICALLY TWO TIMES A WEEK (Patient taking differently: as needed.)  . fluticasone (FLONASE) 50 MCG/ACT nasal spray Place 1 spray into both nostrils daily as needed.  . hydrochlorothiazide (HYDRODIURIL) 25 MG tablet Take 1 tablet (25 mg total) by mouth daily. (Patient taking differently: Take 37.5 mg by mouth daily.)  . potassium chloride SA (KLOR-CON) 20 MEQ tablet TAKE ONE (1) TABLET BY MOUTH TWO (2) TIMES DAILY  . [DISCONTINUED] LORazepam (ATIVAN) 0.5 MG  tablet Take 1 tablet (0.5 mg total)by mouth 2 (two) times daily as needed for anxiety.  . [DISCONTINUED] valsartan (DIOVAN) 160 MG tablet Take 1 tablet (160 mg total) by mouth daily.  . [DISCONTINUED] amoxicillin (AMOXIL) 500 MG tablet Take 500 mg by mouth. Take 1 tablet one hour prior to dental work. (Patient not taking: Reported on 07/28/2020)  . [DISCONTINUED] nystatin cream (MYCOSTATIN) Apply to affected area 2 times daily (Patient not taking: Reported on 07/28/2020)   No facility-administered medications prior to visit.    Review of Systems  Constitutional: Negative for chills, fatigue and fever.  HENT: Negative for congestion, ear pain, rhinorrhea, sinus pain and sore throat.   Respiratory: Negative for cough, shortness of breath and wheezing.   Cardiovascular: Negative for chest pain and leg swelling.  Gastrointestinal: Positive for constipation (followed by a burning sensation). Negative for abdominal pain, blood in stool, diarrhea, nausea and vomiting.  Genitourinary: Negative for dysuria, flank pain, frequency and urgency.  Neurological: Negative for dizziness and headaches.        Objective    BP  130/70 (BP Location: Left Arm, Patient Position: Sitting, Cuff Size: Normal)   Pulse 67   Temp 97.7 F (36.5 C) (Oral)   Resp 16   Ht 5\' 2"  (1.575 m)   Wt 103 lb 9.6 oz (47 kg)   SpO2 98%   BMI 18.95 kg/m  BP Readings from Last 3 Encounters:  07/28/20 130/70  01/28/20 (!) 163/77  11/18/19 134/72   Wt Readings from Last 3 Encounters:  07/28/20 103 lb 9.6 oz (47 kg)  01/28/20 104 lb (47.2 kg)  11/18/19 106 lb 9.6 oz (48.4 kg)      Physical Exam Vitals reviewed.  Constitutional:      General: She is not in acute distress.    Appearance: Normal appearance. She is well-developed. She is not diaphoretic.  HENT:     Head: Normocephalic and atraumatic.  Eyes:     General: No scleral icterus.    Conjunctiva/sclera: Conjunctivae normal.  Neck:     Thyroid: No  thyromegaly.  Cardiovascular:     Rate and Rhythm: Normal rate and regular rhythm.     Pulses: Normal pulses.     Heart sounds: Murmur heard.    Pulmonary:     Effort: Pulmonary effort is normal. No respiratory distress.     Breath sounds: Normal breath sounds. No wheezing, rhonchi or rales.  Musculoskeletal:     Cervical back: Neck supple.     Right lower leg: No edema.     Left lower leg: No edema.  Lymphadenopathy:     Cervical: No cervical adenopathy.  Skin:    General: Skin is warm and dry.     Findings: No rash.  Neurological:     Mental Status: She is alert and oriented to person, place, and time. Mental status is at baseline.  Psychiatric:        Mood and Affect: Mood normal.        Behavior: Behavior normal.      No results found for any visits on 07/28/20.  Assessment & Plan     Problem List Items Addressed This Visit      Cardiovascular and Mediastinum   Benign hypertension - Primary    Well controlled Continue current medications, but switch back from valsartan to losartan now that backorder is resolved Recheck metabolic panel F/u in 6 months       Relevant Medications   losartan (COZAAR) 100 MG tablet   Other Relevant Orders   Basic Metabolic Panel (BMET)     Musculoskeletal and Integument   Rectal itching    Trial of hydrocortisone BID        Other   Anxiety    Chronic and previously well controlled Exacerbated in the setting of health issues in her husband and sister Continue lorazepam prn - discussed risks decliens SSRI Encourage therapy      Relevant Medications   LORazepam (ATIVAN) 0.5 MG tablet   Constipation    Longstanding Will try to find balance with metamucil to avoid diarrhea Goal 1 soft BM daily          Return in about 6 months (around 01/27/2021) for CPE, AWV.      01/29/2021 [place],acting as a Danelle Earthly for Neurosurgeon, MD.,have documented all relevant documentation on the behalf of Shirlee Latch, MD,as  directed by  Shirlee Latch, MD while in the presence of Shirlee Latch, MD.  I, Shirlee Latch, MD, have reviewed all documentation for this visit. The documentation on 07/28/20 for the exam,  diagnosis, procedures, and orders are all accurate and complete.   Ryah Cribb, Dionne Bucy, MD, MPH [facility]

## 2020-07-28 NOTE — Assessment & Plan Note (Signed)
Longstanding Will try to find balance with metamucil to avoid diarrhea Goal 1 soft BM daily

## 2020-07-28 NOTE — Assessment & Plan Note (Signed)
Well controlled Continue current medications, but switch back from valsartan to losartan now that backorder is resolved Recheck metabolic panel F/u in 6 months

## 2020-07-29 LAB — BASIC METABOLIC PANEL
BUN/Creatinine Ratio: 13 (ref 12–28)
BUN: 11 mg/dL (ref 8–27)
CO2: 22 mmol/L (ref 20–29)
Calcium: 10.2 mg/dL (ref 8.7–10.3)
Chloride: 98 mmol/L (ref 96–106)
Creatinine, Ser: 0.88 mg/dL (ref 0.57–1.00)
Glucose: 109 mg/dL — ABNORMAL HIGH (ref 65–99)
Potassium: 3.8 mmol/L (ref 3.5–5.2)
Sodium: 137 mmol/L (ref 134–144)
eGFR: 64 mL/min/{1.73_m2} (ref >59)

## 2020-08-02 ENCOUNTER — Ambulatory Visit: Payer: Medicare PPO | Admitting: Obstetrics and Gynecology

## 2020-08-02 ENCOUNTER — Encounter: Payer: Self-pay | Admitting: Obstetrics and Gynecology

## 2020-08-02 ENCOUNTER — Other Ambulatory Visit: Payer: Self-pay

## 2020-08-02 VITALS — BP 126/72 | Ht 64.0 in | Wt 105.4 lb

## 2020-08-02 DIAGNOSIS — N904 Leukoplakia of vulva: Secondary | ICD-10-CM

## 2020-08-02 MED ORDER — CLOBETASOL PROPIONATE 0.05 % EX OINT: TOPICAL_OINTMENT | CUTANEOUS | 5 refills | Status: DC

## 2020-08-02 NOTE — Progress Notes (Signed)
Patient ID: Susan Booth, female   DOB: 06/13/1930, 87 y.o.   MRN: [id number]  Reason for Consult: Gynecologic Exam   Referred by Erasmo Downer, MD  Subjective:     HPI:  Susan Booth is a 87 y.o. female. She reports that she was seen in the past by Dr. Greggory Keen for lichen sclerosus.  She reports that she was started on clobetasol which she used for a long time and improved.    She has recently stopped using the clobetasol but the pain has returned.  She feels a tugging pain which is especially painful closer to her rectum.  She was prescribed Premarin but has not seen an improvement in her symptoms.  She has significant vulvar itching and burning pain.  Gynecological History  No LMP recorded. Patient has had a hysterectomy. Menarche: uncertain Menopause: 36  History of fibroids, polyps, or ovarian cysts? : no  History of PCOS? no Hstory of Endometriosis? no History of abnormal pap smears? no Have you had any sexually transmitted infections in the past? no  She identifies as a female. She is not sexually active.   Obstetrical History OB History  Gravida Para Term Preterm AB Living  2 2 2     2   SAB IAB Ectopic Multiple Live Births          2    # Outcome Date GA Lbr Len/2nd Weight Sex Delivery Anes PTL Lv  2 Term 04/01/56 [redacted]w[redacted]d  6 lb 8 oz (2.948 kg) M Vag-Spont   LIV  1 Term 04/02/55 [redacted]w[redacted]d  6 lb 1 oz (2.75 kg) M Vag-Spont   LIV     Past Medical History:  Diagnosis Date  . Anxiety   . Benign essential tremor   . Complication of anesthesia    nausea  . Gastroesophageal reflux disease   . Hypertension   . Lichen sclerosus et atrophicus of the vulva   . Murmur   . Need for SBE (subacute bacterial endocarditis) prophylaxis   . Osteoporosis, post-menopausal   . Other chest pain   . Paresthesia   . Rhinitis, allergic    Family History  Problem Relation Age of Onset  . COPD Mother   . Early death Mother   . Lung cancer Mother   . Heart  disease Father   . Heart attack Father   . Heart disease Sister        heart valve problem  . Dementia Sister   . Breast cancer Maternal Aunt 70  . Prostate cancer Son   . Prostate cancer Son    Past Surgical History:  Procedure Laterality Date  . ABDOMINAL HYSTERECTOMY  1968  . ASD REPAIR, SECUNDUM  03/2007  . CATARACT EXTRACTION Bilateral 06/2012  . CORRECTION HAMMER TOE Bilateral 2003  . hole in heart repaired     continue to have some leakage  . ORIF PATELLA Left 11/24/2015   Procedure: OPEN REDUCTION INTERNAL (ORIF) FIXATION PATELLA;  Surgeon: 11/26/2015, MD;  Location: [facility] ORS;  Service: Orthopedics;  Laterality: Left;  (PER Dr Juanell Fairly Spinal would be ideal     Short Social History:  Social History   Tobacco Use  . Smoking status: Never Smoker  . Smokeless tobacco: Never Used  Substance Use Topics  . Alcohol use: Yes    Comment: rare - 1 glass of wine    No Known Allergies  Current Outpatient Medications  Medication Sig Dispense Refill  . aspirin EC 81 MG tablet  Take 81 mg by mouth daily.    . Calcium Carbonate-Vitamin D 600-400 MG-UNIT per tablet CALTRATE 600+D, 600-400MG -UNIT (Oral Tablet)  1 Every Day for 0 days  Quantity: 0.00;  Refills: 0   Ordered :12-Jan-2010  [name] ;  Started 15-Aug-2008 Active Comments: DX: 733.90    . clobetasol ointment (TEMOVATE) 0.05 % Apply to affected area every night for 4 weeks, then every other day for 4 weeks and then twice a week for 4 weeks or until resolution. 30 g 5  . fluticasone (FLONASE) 50 MCG/ACT nasal spray Place 1 spray into both nostrils daily as needed. 48 g 3  . hydrochlorothiazide (HYDRODIURIL) 25 MG tablet Take 1 tablet (25 mg total) by mouth daily. (Patient taking differently: Take 37.5 mg by mouth daily.) 90 tablet 3  . LORazepam (ATIVAN) 0.5 MG tablet Take 1 tablet (0.5 mg total)by mouth 2 (two) times daily as needed for anxiety. 60 tablet 2  . losartan (COZAAR) 100 MG tablet Take 1 tablet (100 mg  total) by mouth daily. 90 tablet 3  . potassium chloride SA (KLOR-CON) 20 MEQ tablet TAKE ONE (1) TABLET BY MOUTH TWO (2) TIMES DAILY 180 tablet 0   No current facility-administered medications for this visit.    Review of Systems  Constitutional: Negative for chills, fatigue, fever and unexpected weight change.  HENT: Negative for trouble swallowing.  Eyes: Negative for loss of vision.  Respiratory: Negative for cough, shortness of breath and wheezing.  Cardiovascular: Negative for chest pain, leg swelling, palpitations and syncope.  GI: Negative for abdominal pain, blood in stool, diarrhea, nausea and vomiting.  GU: Negative for difficulty urinating, dysuria, frequency and hematuria.  Musculoskeletal: Negative for back pain, leg pain and joint pain.  Skin: Negative for rash.  Neurological: Negative for dizziness, headaches, light-headedness, numbness and seizures.  Psychiatric: Negative for behavioral problem, confusion, depressed mood and sleep disturbance.        Objective:  Objective   Vitals:   08/02/20 1006  BP: 126/72  Weight: 105 lb 6.4 oz (47.8 kg)  Height: 5\' 4"  (1.626 m)   Body mass index is 18.09 kg/m.  Physical Exam Vitals and nursing note reviewed. Exam conducted with a chaperone present.  Constitutional:      Appearance: Normal appearance. She is well-developed.  HENT:     Head: Normocephalic and atraumatic.  Eyes:     Extraocular Movements: Extraocular movements intact.     Pupils: Pupils are equal, round, and reactive to light.  Cardiovascular:     Rate and Rhythm: Normal rate and regular rhythm.  Pulmonary:     Effort: Pulmonary effort is normal. No respiratory distress.     Breath sounds: Normal breath sounds.  Abdominal:     General: Abdomen is flat.     Palpations: Abdomen is soft.  Genitourinary:      Comments: External: vulvar changes characteristic of lichen sclerosis.  Speculum examination: Normal appearing cuff. Bimanual examination:  Uterus absent.  No adnexal masses. No adnexal tenderness. Pelvis not fixed.  Breast exam: exam not performed Musculoskeletal:        General: No signs of injury.  Skin:    General: Skin is warm and dry.  Neurological:     Mental Status: She is alert and oriented to person, place, and time.  Psychiatric:        Behavior: Behavior normal.        Thought Content: Thought content normal.        Judgment: Judgment  normal.     Assessment/Plan:    87 yo with lichen sclerosis We discussed proper care for the vulva and hygiene measures. Encouraged to not use soap to wash the vulva, but instead warm water.  Will restart clobetasol ointment. Daily for 4 weeks, then every other day for 4 weeks, then decrease to once to twice a week applications long term for maintenance.  Discussed surveillance of the vulva every 6 months long term for vulvar monitoring.  Provided with detailed handout on lichen sclerosis.   More than 15 minutes were spent face to face with the patient in the room, reviewing the medical record, labs and images, and coordinating care for the patient. The plan of management was discussed in detail and counseling was provided.    Adelene Idler MD [facility], [facility] 08/02/2020 10:56 AM

## 2020-08-02 NOTE — Patient Instructions (Signed)
General vulvovaginal hygiene measures Keep vulva clean, dry, and well aerated  0 Avoid sleeper pajamas. Nightgowns allow air to circulate.  0 Cotton underpants. Double-rinse underwear after washing to avoid residual irritants. Do not use fabric softeners for underwear and swimsuits.  0 Avoid tights, leotards, and leggings. Skirts and loose-fitting pants allow air to circulate.  0 Avoid sitting in wet swimsuits for long periods of time.  Daily warm bathing   0 Do not use bubble baths or perfumed soaps.  0 soak in clean water (no soap) for 10 to 15 minutes.  0 Use soap to wash regions other than the genital area just before getting out of the tub. Limit use of any soap on genital areas.  0 Rinse the genital area well and gently pat dry.  0 A hair dryer on the cool setting may be helpful to assist with drying the genital region.  0 If the vulvar area is tender or swollen, cool compresses may relieve the discomfort. Emollients may help protect skin.  Review toilet hygiene    1 Sit with knees apart to reduce reflux of urine into the vagina.    0 Emphasize wiping front to back after bowel movements.  0 Wet wipes can be used instead of toilet paper for wiping as long as they don't cause a "stinging" sensation.     

## 2020-08-30 DIAGNOSIS — I270 Primary pulmonary hypertension: Secondary | ICD-10-CM | POA: Diagnosis not present

## 2020-08-30 DIAGNOSIS — I351 Nonrheumatic aortic (valve) insufficiency: Secondary | ICD-10-CM | POA: Diagnosis not present

## 2020-09-28 ENCOUNTER — Other Ambulatory Visit: Payer: Self-pay | Admitting: Family Medicine

## 2020-09-28 DIAGNOSIS — E876 Hypokalemia: Secondary | ICD-10-CM

## 2020-10-03 ENCOUNTER — Encounter: Payer: Self-pay | Admitting: Obstetrics and Gynecology

## 2020-10-03 ENCOUNTER — Other Ambulatory Visit: Payer: Self-pay

## 2020-10-03 ENCOUNTER — Ambulatory Visit: Payer: Medicare PPO | Admitting: Obstetrics and Gynecology

## 2020-10-03 VITALS — BP 118/70 | Ht 64.0 in | Wt 103.4 lb

## 2020-10-03 DIAGNOSIS — N3001 Acute cystitis with hematuria: Secondary | ICD-10-CM

## 2020-10-03 DIAGNOSIS — K219 Gastro-esophageal reflux disease without esophagitis: Secondary | ICD-10-CM | POA: Diagnosis not present

## 2020-10-03 DIAGNOSIS — R059 Cough, unspecified: Secondary | ICD-10-CM | POA: Diagnosis not present

## 2020-10-03 DIAGNOSIS — R300 Dysuria: Secondary | ICD-10-CM

## 2020-10-03 LAB — POCT URINALYSIS DIPSTICK
Bilirubin, UA: NEGATIVE
Blood, UA: POSITIVE
Glucose, UA: NEGATIVE
Nitrite, UA: NEGATIVE
Protein, UA: POSITIVE — AB
Spec Grav, UA: 1.025 (ref 1.010–1.025)
Urobilinogen, UA: 0.2 E.U./dL
pH, UA: 5 (ref 5.0–8.0)

## 2020-10-03 MED ORDER — AMOXICILLIN-POT CLAVULANATE 875-125 MG PO TABS: 1.0000 | ORAL_TABLET | Freq: Two times a day (BID) | ORAL | 0 refills | Status: AC

## 2020-10-03 NOTE — Progress Notes (Signed)
Patient ID: Susan Booth, female   DOB: 05-20-1930, 87 y.o.   MRN: [id number]  Reason for Consult: Gynecologic Exam   Referred by Erasmo Downer, MD  Subjective:     HPI:  Susan Booth is a 87 y.o. female.  She reports that she has been having severe pain with urination since Friday of last week.  She believes that she may have a urinary tract infection.  She has only had 1 prior urinary tract infection in her life.  She denies any fevers.  She has been using clobetasol although continues to have itching of her bottom. She does scratch or pat her vulva because of the pain.  She is seeing small amounts of bleeding.  Gynecological History  No LMP recorded. Patient has had a hysterectomy.   Past Medical History:  Diagnosis Date   Anxiety    Benign essential tremor    Complication of anesthesia    nausea   Gastroesophageal reflux disease    Hypertension    Lichen sclerosus et atrophicus of the vulva    Murmur    Need for SBE (subacute bacterial endocarditis) prophylaxis    Osteoporosis, post-menopausal    Other chest pain    Paresthesia    Rhinitis, allergic    Family History  Problem Relation Age of Onset   COPD Mother    Early death Mother    Lung cancer Mother    Heart disease Father    Heart attack Father    Heart disease Sister        heart valve problem   Dementia Sister    Breast cancer Maternal Aunt 54   Prostate cancer Son    Prostate cancer Son    Past Surgical History:  Procedure Laterality Date   ABDOMINAL HYSTERECTOMY  1968   ASD REPAIR, SECUNDUM  03/2007   CATARACT EXTRACTION Bilateral 06/2012   CORRECTION HAMMER TOE Bilateral 2003   hole in heart repaired     continue to have some leakage   ORIF PATELLA Left 11/24/2015   Procedure: OPEN REDUCTION INTERNAL (ORIF) FIXATION PATELLA;  Surgeon: Juanell Fairly, MD;  Location: [facility] ORS;  Service: Orthopedics;  Laterality: Left;  (PER Dr Rico Ala Spinal would be ideal     Short  Social History:  Social History   Tobacco Use   Smoking status: Never   Smokeless tobacco: Never  Substance Use Topics   Alcohol use: Yes    Comment: rare - 1 glass of wine    No Known Allergies  Current Outpatient Medications  Medication Sig Dispense Refill   aspirin EC 81 MG tablet Take 81 mg by mouth daily.     Calcium Carbonate-Vitamin D 600-400 MG-UNIT per tablet CALTRATE 600+D, 600-400MG -UNIT (Oral Tablet)  1 Every Day for 0 days  Quantity: 0.00;  Refills: 0   Ordered :12-Jan-2010  [name] ;  Started 15-Aug-2008 Active Comments: DX: 733.90     clobetasol ointment (TEMOVATE) 0.05 % Apply to affected area every night for 4 weeks, then every other day for 4 weeks and then twice a week for 4 weeks or until resolution. 30 g 5   fluticasone (FLONASE) 50 MCG/ACT nasal spray Place 1 spray into both nostrils daily as needed. 48 g 3   hydrochlorothiazide (HYDRODIURIL) 25 MG tablet Take 1 tablet (25 mg total) by mouth daily. (Patient taking differently: Take 37.5 mg by mouth daily.) 90 tablet 3   LORazepam (ATIVAN) 0.5 MG tablet Take 1 tablet (0.5 mg  total)by mouth 2 (two) times daily as needed for anxiety. 60 tablet 2   losartan (COZAAR) 100 MG tablet Take 1 tablet (100 mg total) by mouth daily. 90 tablet 3   potassium chloride SA (KLOR-CON) 20 MEQ tablet TAKE ONE (1) TABLET BY MOUTH TWO (2) TIMES DAILY 180 tablet 0   No current facility-administered medications for this visit.    Review of Systems  Constitutional: Negative for chills, fatigue, fever and unexpected weight change.  HENT: Negative for trouble swallowing.  Eyes: Negative for loss of vision.  Respiratory: Negative for cough, shortness of breath and wheezing.  Cardiovascular: Negative for chest pain, leg swelling, palpitations and syncope.  GI: Negative for abdominal pain, blood in stool, diarrhea, nausea and vomiting.  GU: Negative for difficulty urinating, dysuria, frequency and hematuria.  Musculoskeletal: Negative  for back pain, leg pain and joint pain.  Skin: Negative for rash.  Neurological: Negative for dizziness, headaches, light-headedness, numbness and seizures.  Psychiatric: Negative for behavioral problem, confusion, depressed mood and sleep disturbance.       Objective:  Objective   Vitals:   10/03/20 0950  BP: 118/70  Weight: 103 lb 6.4 oz (46.9 kg)  Height: 5\' 4"  (1.626 m)   Body mass index is 17.75 kg/m.  Physical Exam Vitals and nursing note reviewed. Exam conducted with a chaperone present.  Constitutional:      Appearance: Normal appearance. She is well-developed.  HENT:     Head: Normocephalic and atraumatic.  Eyes:     Extraocular Movements: Extraocular movements intact.     Pupils: Pupils are equal, round, and reactive to light.  Cardiovascular:     Rate and Rhythm: Normal rate and regular rhythm.  Pulmonary:     Effort: Pulmonary effort is normal. No respiratory distress.     Breath sounds: Normal breath sounds.  Abdominal:     General: Abdomen is flat.     Palpations: Abdomen is soft.  Genitourinary:    Comments: External: Diffuse petechiae bilaterally.  Erythema bilaterally.  Loss of pigmentation at clitoris and perineum.  Musculoskeletal:        General: No signs of injury.  Skin:    General: Skin is warm and dry.  Neurological:     Mental Status: She is alert and oriented to person, place, and time.  Psychiatric:        Behavior: Behavior normal.        Thought Content: Thought content normal.        Judgment: Judgment normal.    Assessment/Plan:    87 year old with dysuria.  UA suggestive of urinary tract infection.  Prescription sent for antibiotics to her pharmacy which encouraged her to start today.  Come to the hospital if issues with change in mental status or fevers noted. Continue clobetasol use daily at this point.  We will reevaluate evaluate vulva with follow-up visit in 2 weeks.  Encourage patient not to scratch and discussed that itch  scratch cycle.  More than 20 minutes were spent face to face with the patient in the room, reviewing the medical record, labs and images, and coordinating care for the patient. The plan of management was discussed in detail and counseling was provided.    97 MD [facility], [facility] 10/03/2020 10:26 AM

## 2020-10-04 LAB — URINALYSIS, ROUTINE W REFLEX MICROSCOPIC
Bilirubin, UA: NEGATIVE
Glucose, UA: NEGATIVE
Ketones, UA: NEGATIVE
Leukocytes,UA: NEGATIVE
Nitrite, UA: NEGATIVE
Protein,UA: NEGATIVE
Specific Gravity, UA: 1.011 (ref 1.005–1.030)
Urobilinogen, Ur: 0.2 mg/dL (ref 0.2–1.0)
pH, UA: 8 — ABNORMAL HIGH (ref 5.0–7.5)

## 2020-10-04 LAB — MICROSCOPIC EXAMINATION
Casts: NONE SEEN /lpf
Epithelial Cells (non renal): NONE SEEN /hpf (ref 0–10)

## 2020-10-07 LAB — URINE CULTURE

## 2020-10-16 DIAGNOSIS — D229 Melanocytic nevi, unspecified: Secondary | ICD-10-CM | POA: Diagnosis not present

## 2020-10-16 DIAGNOSIS — L821 Other seborrheic keratosis: Secondary | ICD-10-CM | POA: Diagnosis not present

## 2020-10-16 DIAGNOSIS — L820 Inflamed seborrheic keratosis: Secondary | ICD-10-CM | POA: Diagnosis not present

## 2020-10-16 DIAGNOSIS — L814 Other melanin hyperpigmentation: Secondary | ICD-10-CM | POA: Diagnosis not present

## 2020-10-17 ENCOUNTER — Other Ambulatory Visit: Payer: Self-pay | Admitting: Family Medicine

## 2020-10-19 ENCOUNTER — Encounter: Payer: Self-pay | Admitting: Obstetrics and Gynecology

## 2020-10-19 ENCOUNTER — Ambulatory Visit: Payer: Medicare PPO | Admitting: Obstetrics and Gynecology

## 2020-10-19 ENCOUNTER — Other Ambulatory Visit: Payer: Self-pay

## 2020-10-19 VITALS — BP 120/70 | Ht 64.0 in | Wt 103.0 lb

## 2020-10-19 DIAGNOSIS — N904 Leukoplakia of vulva: Secondary | ICD-10-CM | POA: Diagnosis not present

## 2020-10-19 NOTE — Progress Notes (Signed)
Patient ID: Susan Booth, female   DOB: 12/12/1930, 87 y.o.   MRN: [id number]  Reason for Consult: Follow-up   Referred by Erasmo Downer, MD  Subjective:     HPI:  Susan Booth is a 87 y.o. female she reports that she has been feeling better and having less symptoms of a urinary tract infection.  She is able to complete her antibiotic.  She has been applying her clobetasol ointment nightly and is having some improvement although she notes an itching which is worse at nighttime.  Gynecological History  No LMP recorded. Patient has had a hysterectomy.  Past Medical History:  Diagnosis Date   Anxiety    Benign essential tremor    Complication of anesthesia    nausea   Gastroesophageal reflux disease    Hypertension    Lichen sclerosus et atrophicus of the vulva    Murmur    Need for SBE (subacute bacterial endocarditis) prophylaxis    Osteoporosis, post-menopausal    Other chest pain    Paresthesia    Rhinitis, allergic    Family History  Problem Relation Age of Onset   COPD Mother    Early death Mother    Lung cancer Mother    Heart disease Father    Heart attack Father    Heart disease Sister        heart valve problem   Dementia Sister    Breast cancer Maternal Aunt 5   Prostate cancer Son    Prostate cancer Son    Past Surgical History:  Procedure Laterality Date   ABDOMINAL HYSTERECTOMY  1968   ASD REPAIR, SECUNDUM  03/2007   CATARACT EXTRACTION Bilateral 06/2012   CORRECTION HAMMER TOE Bilateral 2003   hole in heart repaired     continue to have some leakage   ORIF PATELLA Left 11/24/2015   Procedure: OPEN REDUCTION INTERNAL (ORIF) FIXATION PATELLA;  Surgeon: Juanell Fairly, MD;  Location: [facility] ORS;  Service: Orthopedics;  Laterality: Left;  (PER Dr Rico Ala Spinal would be ideal     Short Social History:  Social History   Tobacco Use   Smoking status: Never   Smokeless tobacco: Never  Substance Use Topics   Alcohol use: Yes     Comment: rare - 1 glass of wine    No Known Allergies  Current Outpatient Medications  Medication Sig Dispense Refill   aspirin EC 81 MG tablet Take 81 mg by mouth daily.     Calcium Carbonate-Vitamin D 600-400 MG-UNIT per tablet CALTRATE 600+D, 600-400MG -UNIT (Oral Tablet)  1 Every Day for 0 days  Quantity: 0.00;  Refills: 0   Ordered :12-Jan-2010  [name] ;  Started 15-Aug-2008 Active Comments: DX: 733.90     clobetasol ointment (TEMOVATE) 0.05 % Apply to affected area every night for 4 weeks, then every other day for 4 weeks and then twice a week for 4 weeks or until resolution. 30 g 5   fluticasone (FLONASE) 50 MCG/ACT nasal spray Place 1 spray into both nostrils daily as needed. 48 g 3   hydrochlorothiazide (HYDRODIURIL) 25 MG tablet Take 1 tablet (25 mg total) by mouth daily. (Patient taking differently: Take 37.5 mg by mouth daily.) 90 tablet 3   LORazepam (ATIVAN) 0.5 MG tablet Take 1 tablet (0.5 mg total)by mouth 2 (two) times daily as needed for anxiety. 60 tablet 2   losartan (COZAAR) 100 MG tablet Take 1 tablet (100 mg total) by mouth daily. 90 tablet 3  potassium chloride SA (KLOR-CON) 20 MEQ tablet TAKE ONE (1) TABLET BY MOUTH TWO (2) TIMES DAILY 180 tablet 0   No current facility-administered medications for this visit.    Review of Systems  Constitutional: Negative for chills, fatigue, fever and unexpected weight change.  HENT: Negative for trouble swallowing.  Eyes: Negative for loss of vision.  Respiratory: Negative for cough, shortness of breath and wheezing.  Cardiovascular: Negative for chest pain, leg swelling, palpitations and syncope.  GI: Negative for abdominal pain, blood in stool, diarrhea, nausea and vomiting.  GU: Negative for difficulty urinating, dysuria, frequency and hematuria.  Musculoskeletal: Negative for back pain, leg pain and joint pain.  Skin: Negative for rash.  Neurological: Negative for dizziness, headaches, light-headedness, numbness  and seizures.  Psychiatric: Negative for behavioral problem, confusion, depressed mood and sleep disturbance.       Objective:  Objective   Vitals:   10/19/20 1006  BP: 120/70  Weight: 103 lb (46.7 kg)  Height: 5\' 4"  (1.626 m)   Body mass index is 17.68 kg/m.  Physical Exam Vitals and nursing note reviewed. Exam conducted with a chaperone present.  Constitutional:      Appearance: Normal appearance. She is well-developed.  HENT:     Head: Normocephalic and atraumatic.  Eyes:     Extraocular Movements: Extraocular movements intact.     Pupils: Pupils are equal, round, and reactive to light.  Cardiovascular:     Rate and Rhythm: Normal rate and regular rhythm.  Pulmonary:     Effort: Pulmonary effort is normal. No respiratory distress.     Breath sounds: Normal breath sounds.  Abdominal:     General: Abdomen is flat.     Palpations: Abdomen is soft.  Genitourinary:    Comments: Bilateral petechia of the labia.  Improvement in the whitening of the labia minora and clitoral areas. Musculoskeletal:        General: No signs of injury.  Skin:    General: Skin is warm and dry.  Neurological:     Mental Status: She is alert and oriented to person, place, and time.  Psychiatric:        Behavior: Behavior normal.        Thought Content: Thought content normal.        Judgment: Judgment normal.    Assessment/Plan:     87 year old with lichen sclerosis Plan to continue with nightly application of clobetasol ointment for the next 4 weeks and will follow up in 4 weeks to reassess.  Patient appears to have had visual improvement in skin integrity already.  Discussed that she can consider nightly Benadryl before bedtime to help with itching although I cautioned her that this medication may cause drowsiness.  Follow-up in 4 weeks More than 10 minutes were spent face to face with the patient in the room, reviewing the medical record, labs and images, and coordinating care for the  patient. The plan of management was discussed in detail and counseling was provided.     97 MD [facility], [facility] 10/19/2020 10:33 AM

## 2020-11-15 DIAGNOSIS — R059 Cough, unspecified: Secondary | ICD-10-CM | POA: Diagnosis not present

## 2020-11-16 ENCOUNTER — Encounter: Payer: Medicare PPO | Admitting: Obstetrics and Gynecology

## 2020-11-20 ENCOUNTER — Other Ambulatory Visit: Payer: Self-pay

## 2020-11-20 ENCOUNTER — Ambulatory Visit: Payer: Medicare PPO | Admitting: Obstetrics and Gynecology

## 2020-11-20 ENCOUNTER — Encounter: Payer: Self-pay | Admitting: Obstetrics and Gynecology

## 2020-11-20 VITALS — BP 118/70 | Ht 64.0 in | Wt 104.2 lb

## 2020-11-20 DIAGNOSIS — N952 Postmenopausal atrophic vaginitis: Secondary | ICD-10-CM

## 2020-11-20 DIAGNOSIS — N904 Leukoplakia of vulva: Secondary | ICD-10-CM

## 2020-11-20 NOTE — Progress Notes (Signed)
Patient ID: Susan Booth, female   DOB: [birth year], 87 y.o.   MRN: [id number]  Reason for Consult: Gynecologic Exam   Referred by Erasmo Downer, MD  Subjective:     HPI:  Susan Booth is a 87 y.o. female she is following up today regarding lichen sclerosus.  She reports that she is not been applying steroid ointment to her bottom.  She reports that she experiences burning is mostly at nighttime.  She denies any itching.  She reports that she has just been applying A&D ointment.  She notes that she has had several family stressors.  Her husband is not well.  Her sister was recently diagnosed with Alzheimer's.  Her son has been diagnosed with prostate cancer and lives in [place].  None of her children live in state.  She lives across the street from her sister who she checks off.  She is very close with her sister.  Gynecological History  No LMP recorded. Patient has had a hysterectomy.  Past Medical History:  Diagnosis Date   Anxiety    Benign essential tremor    Complication of anesthesia    nausea   Gastroesophageal reflux disease    Hypertension    Lichen sclerosus et atrophicus of the vulva    Murmur    Need for SBE (subacute bacterial endocarditis) prophylaxis    Osteoporosis, post-menopausal    Other chest pain    Paresthesia    Rhinitis, allergic    Family History  Problem Relation Age of Onset   COPD Mother    Early death Mother    Lung cancer Mother    Heart disease Father    Heart attack Father    Heart disease Sister        heart valve problem   Dementia Sister    Breast cancer Maternal Aunt 37   Prostate cancer Son    Prostate cancer Son    Past Surgical History:  Procedure Laterality Date   ABDOMINAL HYSTERECTOMY  1968   ASD REPAIR, SECUNDUM  03/2007   CATARACT EXTRACTION Bilateral 06/2012   CORRECTION HAMMER TOE Bilateral 2003   hole in heart repaired     continue to have some leakage   ORIF PATELLA Left 11/24/2015   Procedure:  OPEN REDUCTION INTERNAL (ORIF) FIXATION PATELLA;  Surgeon: Juanell Fairly, MD;  Location: [facility] ORS;  Service: Orthopedics;  Laterality: Left;  (PER Dr Rico Ala Spinal would be ideal     Short Social History:  Social History   Tobacco Use   Smoking status: Never   Smokeless tobacco: Never  Substance Use Topics   Alcohol use: Yes    Comment: rare - 1 glass of wine    No Known Allergies  Current Outpatient Medications  Medication Sig Dispense Refill   aspirin EC 81 MG tablet Take 81 mg by mouth daily.     Calcium Carbonate-Vitamin D 600-400 MG-UNIT per tablet CALTRATE 600+D, 600-400MG -UNIT (Oral Tablet)  1 Every Day for 0 days  Quantity: 0.00;  Refills: 0   Ordered :12-Jan-2010  [name] ;  Started 15-Aug-2008 Active Comments: DX: 733.90     clobetasol ointment (TEMOVATE) 0.05 % Apply to affected area every night for 4 weeks, then every other day for 4 weeks and then twice a week for 4 weeks or until resolution. 30 g 5   fluticasone (FLONASE) 50 MCG/ACT nasal spray Place 1 spray into both nostrils daily as needed. 48 g 3   hydrochlorothiazide (HYDRODIURIL) 25  MG tablet Take 1 tablet (25 mg total) by mouth daily. (Patient taking differently: Take 37.5 mg by mouth daily.) 90 tablet 3   LORazepam (ATIVAN) 0.5 MG tablet Take 1 tablet (0.5 mg total)by mouth 2 (two) times daily as needed for anxiety. 60 tablet 2   losartan (COZAAR) 100 MG tablet Take 1 tablet (100 mg total) by mouth daily. 90 tablet 3   potassium chloride SA (KLOR-CON) 20 MEQ tablet TAKE ONE (1) TABLET BY MOUTH TWO (2) TIMES DAILY 180 tablet 0   No current facility-administered medications for this visit.    Review of Systems  Constitutional: Negative for chills, fatigue, fever and unexpected weight change.  HENT: Negative for trouble swallowing.  Eyes: Negative for loss of vision.  Respiratory: Negative for cough, shortness of breath and wheezing.  Cardiovascular: Negative for chest pain, leg swelling, palpitations  and syncope.  GI: Negative for abdominal pain, blood in stool, diarrhea, nausea and vomiting.  GU: Negative for difficulty urinating, dysuria, frequency and hematuria.  Musculoskeletal: Negative for back pain, leg pain and joint pain.  Skin: Negative for rash.  Neurological: Negative for dizziness, headaches, light-headedness, numbness and seizures.  Psychiatric: Negative for behavioral problem, confusion, depressed mood and sleep disturbance.       Objective:  Objective   Vitals:   11/20/20 1007  BP: 118/70  Weight: 104 lb 3.2 oz (47.3 kg)  Height: 5\' 4"  (1.626 m)   Body mass index is 17.89 kg/m.  Physical Exam Vitals and nursing note reviewed. Exam conducted with a chaperone present.  Constitutional:      Appearance: Normal appearance. She is well-developed.  HENT:     Head: Normocephalic and atraumatic.  Eyes:     Extraocular Movements: Extraocular movements intact.     Pupils: Pupils are equal, round, and reactive to light.  Cardiovascular:     Rate and Rhythm: Normal rate and regular rhythm.  Pulmonary:     Effort: Pulmonary effort is normal. No respiratory distress.     Breath sounds: Normal breath sounds.  Abdominal:     General: Abdomen is flat.     Palpations: Abdomen is soft.  Genitourinary:    Comments: Whitening has improved Bilateral petechiae present on labia.  Small erythema at the posterior fourchette  still present  Breast exam: exam not performed Musculoskeletal:        General: No signs of injury.  Skin:    General: Skin is warm and dry.  Neurological:     Mental Status: She is alert and oriented to person, place, and time.  Psychiatric:        Behavior: Behavior normal.        Thought Content: Thought content normal.        Judgment: Judgment normal.     Assessment/Plan:    87 yo with lichen sclerosis. Patient did not recall that we had discussed of application of clobetasol on a nightly basis.  She also did not recall that we had  discussed using clobetasol long-term once to twice a week to maintain her symptoms of once resolution and improvement was experienced. Discussed restarting clobetasol ointment application every other night and alternating with A&E ointment. We will plan to follow-up with patient in 3 months.  More than 20 minutes were spent face to face with the patient in the room, reviewing the medical record, labs and images, and coordinating care for the patient. The plan of management was discussed in detail and counseling was provided.  Adelene Idler MD [facility], [facility] 11/20/2020 10:41 AM

## 2020-12-25 ENCOUNTER — Other Ambulatory Visit: Payer: Self-pay | Admitting: Family Medicine

## 2020-12-25 DIAGNOSIS — E876 Hypokalemia: Secondary | ICD-10-CM

## 2021-01-09 DIAGNOSIS — I10 Essential (primary) hypertension: Secondary | ICD-10-CM | POA: Diagnosis not present

## 2021-01-09 DIAGNOSIS — I270 Primary pulmonary hypertension: Secondary | ICD-10-CM | POA: Diagnosis not present

## 2021-01-09 DIAGNOSIS — I491 Atrial premature depolarization: Secondary | ICD-10-CM | POA: Diagnosis not present

## 2021-01-09 DIAGNOSIS — I351 Nonrheumatic aortic (valve) insufficiency: Secondary | ICD-10-CM | POA: Diagnosis not present

## 2021-01-10 DIAGNOSIS — Z0100 Encounter for examination of eyes and vision without abnormal findings: Secondary | ICD-10-CM | POA: Diagnosis not present

## 2021-01-10 DIAGNOSIS — Z961 Presence of intraocular lens: Secondary | ICD-10-CM | POA: Diagnosis not present

## 2021-02-01 ENCOUNTER — Encounter: Payer: Medicare PPO | Admitting: Family Medicine

## 2021-02-02 NOTE — Progress Notes (Signed)
Annual Wellness Visit     Patient: Susan Booth, Female    DOB: [birth year], 87 y.o.   MRN: [id number] Visit Date: 02/05/2021  Today's Provider: Lavon Paganini, MD   Chief Complaint  Patient presents with   Medicare Wellness   Subjective    Susan Booth is a 87 y.o. female who presents today for her Annual Wellness Visit. She reports consuming a general diet.  She generally feels well. She reports sleeping fairly well. She does have additional problems to discuss today.   HPI 12/03/16 Mammogram-BI-RADS 1 09/27/13 BMD 04/02/02 Colonoscopy   Continues to have intermittent loose stools and hard stools that cause her to strain.  Sometimes she has leak around of liquid stool when she is constipated.  Tried Miralax and it was helpful, but then worsened the runny stools.  Medications: Outpatient Medications Prior to Visit  Medication Sig   aspirin EC 81 MG tablet Take 81 mg by mouth daily.   Calcium Carbonate-Vitamin D 600-400 MG-UNIT per tablet CALTRATE 600+D, 600-400MG-UNIT (Oral Tablet)  1 Every Day for 0 days  Quantity: 0.00;  Refills: 0   Ordered :12-Jan-2010  Abran Richard ;  Started 15-Aug-2008 Active Comments: DX: 733.90   clobetasol ointment (TEMOVATE) 0.05 % Apply to affected area every night for 4 weeks, then every other day for 4 weeks and then twice a week for 4 weeks or until resolution.   fluticasone (FLONASE) 50 MCG/ACT nasal spray Place 1 spray into both nostrils daily as needed.   hydrochlorothiazide (HYDRODIURIL) 25 MG tablet Take 1 tablet (25 mg total) by mouth daily. (Patient taking differently: Take 37.5 mg by mouth daily.)   LORazepam (ATIVAN) 0.5 MG tablet Take 1 tablet (0.5 mg total)by mouth 2 (two) times daily as needed for anxiety.   losartan (COZAAR) 100 MG tablet Take 1 tablet (100 mg total) by mouth daily.   potassium chloride SA (KLOR-CON) 20 MEQ tablet TAKE ONE (1) TABLET BY MOUTH TWO (2) TIMES DAILY   No facility-administered  medications prior to visit.    No Known Allergies  Patient Care Team: Virginia Crews, MD as PCP - General (Family Medicine) Estill Cotta, MD as Consulting Physician (Ophthalmology) Ubaldo Glassing Javier Docker, MD as Consulting Physician (Cardiology) Rubie Maid, MD as Referring Physician (Obstetrics and Gynecology)  Review of Systems  Constitutional:  Positive for fatigue.  Gastrointestinal:  Positive for constipation.  Endocrine: Positive for cold intolerance.  Psychiatric/Behavioral:  The patient is nervous/anxious.   All other systems reviewed and are negative.  Last metabolic panel Lab Results  Component Value Date   GLUCOSE 109 (H) 07/28/2020   NA 137 07/28/2020   K 3.8 07/28/2020   CL 98 07/28/2020   CO2 22 07/28/2020   BUN 11 07/28/2020   CREATININE 0.88 07/28/2020   EGFR 64 07/28/2020   CALCIUM 10.2 07/28/2020   PROT 6.7 01/28/2020   ALBUMIN 4.3 01/28/2020   LABGLOB 2.4 01/28/2020   AGRATIO 1.8 01/28/2020   BILITOT 0.6 01/28/2020   ALKPHOS 84 01/28/2020   AST 20 01/28/2020   ALT 8 01/28/2020   ANIONGAP 5 11/25/2015   Last lipids Lab Results  Component Value Date   CHOL 173 01/28/2020   HDL 55 01/28/2020   LDLCALC 109 (H) 01/28/2020   TRIG 44 01/28/2020   CHOLHDL 3.1 01/28/2020   Last hemoglobin A1c Lab Results  Component Value Date   HGBA1C 5.4 01/28/2020        Objective    Vitals: BP [name])  162/80 (BP Location: Left Arm, Patient Position: Sitting, Cuff Size: Small)   Pulse 69   Temp (!) 97.3 F (36.3 C) (Temporal)   Resp 16   Ht 5' 2.5" (1.588 m)   Wt 105 lb 1.6 oz (47.7 kg)   SpO2 98%   BMI 18.92 kg/m  BP Readings from Last 3 Encounters:  02/05/21 (!) 162/80  11/20/20 118/70  10/19/20 120/70   Wt Readings from Last 3 Encounters:  02/05/21 105 lb 1.6 oz (47.7 kg)  11/20/20 104 lb 3.2 oz (47.3 kg)  10/19/20 103 lb (46.7 kg)      Physical Exam Vitals reviewed.  Constitutional:      General: She is not in acute distress.     Appearance: Normal appearance. She is well-developed. She is not diaphoretic.  HENT:     Head: Normocephalic and atraumatic.  Eyes:     General: No scleral icterus.    Conjunctiva/sclera: Conjunctivae normal.  Neck:     Thyroid: No thyromegaly.  Cardiovascular:     Rate and Rhythm: Normal rate and regular rhythm.     Pulses: Normal pulses.     Heart sounds: Normal heart sounds. No murmur heard. Pulmonary:     Effort: Pulmonary effort is normal. No respiratory distress.     Breath sounds: Normal breath sounds. No wheezing, rhonchi or rales.  Musculoskeletal:     Cervical back: Neck supple.     Right lower leg: No edema.     Left lower leg: No edema.  Lymphadenopathy:     Cervical: No cervical adenopathy.  Skin:    General: Skin is warm and dry.     Findings: No rash.  Neurological:     Mental Status: She is alert and oriented to person, place, and time. Mental status is at baseline.  Psychiatric:        Mood and Affect: Mood normal.        Behavior: Behavior normal.    Most recent functional status assessment: In your present state of health, do you have any difficulty performing the following activities: 02/05/2021  Hearing? Y  Vision? N  Difficulty concentrating or making decisions? N  Walking or climbing stairs? N  Dressing or bathing? N  Doing errands, shopping? N  Some recent data might be hidden   Most recent fall risk assessment: Fall Risk  02/05/2021  Falls in the past year? 0  Number falls in past yr: 0  Injury with Fall? 0  Comment -  Risk for fall due to : -  Follow up -    Most recent depression screenings: PHQ 2/9 Scores 02/05/2021 07/28/2020  PHQ - 2 Score 0 0  PHQ- 9 Score - 2   Most recent cognitive screening: 6CIT Screen 02/05/2021  What Year? 0 points  What month? 0 points  What time? 0 points  Count back from 20 0 points  Months in reverse 0 points  Repeat phrase 4 points  Total Score 4   Most recent Audit-C alcohol use screening Alcohol  Use Disorder Test (AUDIT) 02/05/2021  1. How often do you have a drink containing alcohol? 0  2. How many drinks containing alcohol do you have on a typical day when you are drinking? 0  3. How often do you have six or more drinks on one occasion? 0  AUDIT-C Score 0  Alcohol Brief Interventions/Follow-up -   A score of 3 or more in women, and 4 or more in men indicates increased risk  for alcohol abuse, EXCEPT if all of the points are from question 1   No results found for any visits on 02/05/21.  Assessment & Plan     Annual wellness visit done today including the all of the following: Reviewed patient's Family Medical History Reviewed and updated list of patient's medical providers Assessment of cognitive impairment was done Assessed patient's functional ability Established a written schedule for health screening [place] Completed and Reviewed  Exercise Activities and Dietary recommendations  Goals      DIET - INCREASE WATER INTAKE     Recommend increasing water intake to 4-6 glasses a day.         Immunization History  Administered Date(s) Administered   Fluad Quad(high Dose 65+) 01/07/2019   Influenza, High Dose Seasonal PF 01/05/2014, 12/22/2019   Influenza,inj,quad, With Preservative 12/29/2017   Influenza-Unspecified 12/29/2016, 12/31/2017, 01/02/2021   PFIZER(Purple Top)SARS-COV-2 Vaccination 04/07/2019, 04/28/2019   Pfizer Covid-19 Vaccine Bivalent Booster 80yr & up 01/06/2020, 08/15/2020, 01/02/2021   Pneumococcal Conjugate-13 09/06/2013   Pneumococcal Polysaccharide-23 02/08/2004   Td 07/12/2003   Zoster Recombinat (Shingrix) 02/24/2018, 05/15/2018   Zoster, Live 09/29/2010    Health Maintenance  Topic Date Due   DEXA SCAN  09/28/2015   TETANUS/TDAP  01/01/2023 (Originally 07/11/2013)   Pneumonia Vaccine 87 Years old  Completed   INFLUENZA VACCINE  Completed   COVID-19 Vaccine  Completed   Zoster Vaccines- Shingrix  Completed    HPV VACCINES  Aged Out     Discussed health benefits of physical activity, and encouraged her to engage in regular exercise appropriate for her age and condition.    Problem List Items Addressed This Visit       Cardiovascular and Mediastinum   Benign hypertension    Well controlled on recent readings, but elevated here today likely 2/2 stress Monitor home BPs and call if remain elevated Continue current meds Recheck metabolic panel F/u in 666m    Relevant Orders   Comprehensive metabolic panel   Lipid Panel With LDL/HDL Ratio   Primary pulmonary hypertension ([place]   F/b Cardiology        Other   Constipation    Longstanding Having trouble with meds due to difficulty with intermittent loose stools Will refer to GI for evaluation and management      Relevant Orders   Ambulatory referral to Gastroenterology   Other Visit Diagnoses     Encounter for annual wellness visit (AWV) in Medicare patient    -  Primary   Encounter for annual physical exam       Hyperglycemia       Relevant Orders   Hemoglobin A1c        Return in about 6 months (around 08/05/2021) for chronic disease f/u.     I, AnLavon PaganiniMD, have reviewed all documentation for this visit. The documentation on 02/05/21 for the exam, diagnosis, procedures, and orders are all accurate and complete.   Tashira Torre, AnDionne BucyMD, MPH [facility]

## 2021-02-05 ENCOUNTER — Ambulatory Visit (INDEPENDENT_AMBULATORY_CARE_PROVIDER_SITE_OTHER): Payer: Medicare PPO | Admitting: Family Medicine

## 2021-02-05 ENCOUNTER — Encounter: Payer: Self-pay | Admitting: Family Medicine

## 2021-02-05 ENCOUNTER — Other Ambulatory Visit: Payer: Self-pay

## 2021-02-05 VITALS — BP 162/80 | HR 69 | Temp 97.3°F | Resp 16 | Ht 62.5 in | Wt 105.1 lb

## 2021-02-05 DIAGNOSIS — I270 Primary pulmonary hypertension: Secondary | ICD-10-CM

## 2021-02-05 DIAGNOSIS — K5904 Chronic idiopathic constipation: Secondary | ICD-10-CM | POA: Diagnosis not present

## 2021-02-05 DIAGNOSIS — R739 Hyperglycemia, unspecified: Secondary | ICD-10-CM

## 2021-02-05 DIAGNOSIS — Z0000 Encounter for general adult medical examination without abnormal findings: Secondary | ICD-10-CM | POA: Diagnosis not present

## 2021-02-05 DIAGNOSIS — I10 Essential (primary) hypertension: Secondary | ICD-10-CM | POA: Diagnosis not present

## 2021-02-05 DIAGNOSIS — R636 Underweight: Secondary | ICD-10-CM

## 2021-02-05 NOTE — Assessment & Plan Note (Signed)
F/b Cardiology 

## 2021-02-05 NOTE — Assessment & Plan Note (Signed)
Longstanding Having trouble with meds due to difficulty with intermittent loose stools Will refer to GI for evaluation and management

## 2021-02-05 NOTE — Assessment & Plan Note (Addendum)
Well controlled on recent readings, but elevated here today likely 2/2 stress Monitor home BPs and call if remain elevated Continue current meds Recheck metabolic panel F/u in 6m

## 2021-02-06 ENCOUNTER — Telehealth: Payer: Self-pay

## 2021-02-06 DIAGNOSIS — I10 Essential (primary) hypertension: Secondary | ICD-10-CM | POA: Diagnosis not present

## 2021-02-06 DIAGNOSIS — R739 Hyperglycemia, unspecified: Secondary | ICD-10-CM | POA: Diagnosis not present

## 2021-02-07 LAB — COMPREHENSIVE METABOLIC PANEL
ALT: 10 IU/L (ref 0–32)
AST: 20 IU/L (ref 0–40)
Albumin/Globulin Ratio: 1.5 (ref 1.2–2.2)
Albumin: 4 g/dL (ref 3.6–4.6)
Alkaline Phosphatase: 87 IU/L (ref 44–121)
BUN/Creatinine Ratio: 15 (ref 12–28)
BUN: 14 mg/dL (ref 8–27)
Bilirubin Total: 0.6 mg/dL (ref 0.0–1.2)
CO2: 25 mmol/L (ref 20–29)
Calcium: 9.6 mg/dL (ref 8.7–10.3)
Chloride: 101 mmol/L (ref 96–106)
Creatinine, Ser: 0.93 mg/dL (ref 0.57–1.00)
Globulin, Total: 2.7 g/dL (ref 1.5–4.5)
Glucose: 94 mg/dL (ref 70–99)
Potassium: 3.7 mmol/L (ref 3.5–5.2)
Sodium: 138 mmol/L (ref 134–144)
Total Protein: 6.7 g/dL (ref 6.0–8.5)
eGFR: 59 mL/min/{1.73_m2} — ABNORMAL LOW (ref >59)

## 2021-02-07 LAB — LIPID PANEL WITH LDL/HDL RATIO
Cholesterol, Total: 170 mg/dL (ref 100–199)
HDL: 55 mg/dL (ref >39)
LDL Chol Calc (NIH): 107 mg/dL — ABNORMAL HIGH (ref 0–99)
LDL/HDL Ratio: 1.9 ratio (ref 0.0–3.2)
Triglycerides: 40 mg/dL (ref 0–149)
VLDL Cholesterol Cal: 8 mg/dL (ref 5–40)

## 2021-02-07 LAB — HEMOGLOBIN A1C
Est. average glucose Bld gHb Est-mCnc: 108 mg/dL
Hgb A1c MFr Bld: 5.4 % (ref 4.8–5.6)

## 2021-02-27 ENCOUNTER — Telehealth: Payer: Self-pay

## 2021-02-27 NOTE — Telephone Encounter (Signed)
Patient dropped off blood pressure reading for Dr. B to review.   02/11/21 10:35 am 142/96 hr 63 *irregular heartbeat 02/12/21 9am 143/96 HR 63 02/12/21 7:30pm 146/81 HR 64 02/13/21 10am 162/94 HR 79 *irregular heartbeat  02/15/21 10:10am 159/84 HR 63  *blood pressure monitor has/shows irregular heartbeat  Per Dr. B early BPs of 140/80s ok. 160s and 150s BP are high. Would have to add a 3rd BP medication. Recommend OV to discuss irregular HR. May need ZIO patch.  Patient advised. sd

## 2021-03-01 ENCOUNTER — Encounter: Payer: Self-pay | Admitting: Obstetrics and Gynecology

## 2021-03-01 ENCOUNTER — Ambulatory Visit: Payer: Medicare PPO | Admitting: Obstetrics and Gynecology

## 2021-03-01 ENCOUNTER — Other Ambulatory Visit: Payer: Self-pay

## 2021-03-01 VITALS — BP 116/72 | Ht 62.0 in | Wt 103.8 lb

## 2021-03-01 DIAGNOSIS — N904 Leukoplakia of vulva: Secondary | ICD-10-CM | POA: Diagnosis not present

## 2021-03-12 ENCOUNTER — Encounter: Payer: Self-pay | Admitting: Family Medicine

## 2021-03-12 ENCOUNTER — Other Ambulatory Visit: Payer: Self-pay

## 2021-03-12 ENCOUNTER — Ambulatory Visit: Payer: Medicare PPO | Admitting: Family Medicine

## 2021-03-12 VITALS — BP 159/67 | HR 75 | Temp 97.6°F | Resp 16 | Wt 103.6 lb

## 2021-03-12 DIAGNOSIS — I491 Atrial premature depolarization: Secondary | ICD-10-CM | POA: Insufficient documentation

## 2021-03-12 DIAGNOSIS — I10 Essential (primary) hypertension: Secondary | ICD-10-CM | POA: Diagnosis not present

## 2021-03-12 DIAGNOSIS — I499 Cardiac arrhythmia, unspecified: Secondary | ICD-10-CM | POA: Diagnosis not present

## 2021-03-12 DIAGNOSIS — F4322 Adjustment disorder with anxiety: Secondary | ICD-10-CM

## 2021-03-12 MED ORDER — SERTRALINE HCL 50 MG PO TABS: 50.0000 mg | ORAL_TABLET | Freq: Every day | ORAL | 3 refills | Status: DC

## 2021-03-12 NOTE — Assessment & Plan Note (Addendum)
Home BPs slightly elevated Elevated BP likely due to stress and anxiety Continue to monitor home BP Continue HCTZ 25mg  and losartan 100mg  Recent metabolic panel wnl F/u in 6 weeks

## 2021-03-12 NOTE — Assessment & Plan Note (Signed)
F/b cardiology Known history of PACs

## 2021-03-12 NOTE — Assessment & Plan Note (Signed)
New onset, poorly controlled Start sertraline 50mg  Continue ativan as needed

## 2021-03-12 NOTE — Progress Notes (Signed)
Established patient visit   Patient: Susan Booth   DOB: 1930-08-13   87 y.o. Female  MRN: [id number] Visit Date: 03/12/2021  Today's healthcare provider: Shirlee Latch, MD   Chief Complaint  Patient presents with   Hypertension   Subjective    Hypertension Associated symptoms include shortness of breath. Pertinent negatives include no chest pain or headaches.    HTN - occasionally feels lightheaded when standing up - no headaches or chest pain  Irregular heartbeat  - known history of PACs - f/b cardiology  Anxiety - Endorses high anxiety levels - Stress and anxiety around health of multiple family members  Medications: Outpatient Medications Prior to Visit  Medication Sig   aspirin EC 81 MG tablet Take 81 mg by mouth daily.   Calcium Carbonate-Vitamin D 600-400 MG-UNIT per tablet CALTRATE 600+D, 600-400MG -UNIT (Oral Tablet)  1 Every Day for 0 days  Quantity: 0.00;  Refills: 0   Ordered :12-Jan-2010  [name] ;  Started 15-Aug-2008 Active Comments: DX: 733.90   clobetasol ointment (TEMOVATE) 0.05 % Apply to affected area every night for 4 weeks, then every other day for 4 weeks and then twice a week for 4 weeks or until resolution.   fluticasone (FLONASE) 50 MCG/ACT nasal spray Place 1 spray into both nostrils daily as needed.   hydrochlorothiazide (HYDRODIURIL) 25 MG tablet Take 1 tablet (25 mg total) by mouth daily.   LORazepam (ATIVAN) 0.5 MG tablet Take 1 tablet (0.5 mg total)by mouth 2 (two) times daily as needed for anxiety.   losartan (COZAAR) 100 MG tablet Take 1 tablet (100 mg total) by mouth daily.   potassium chloride SA (KLOR-CON) 20 MEQ tablet TAKE ONE (1) TABLET BY MOUTH TWO (2) TIMES DAILY   No facility-administered medications prior to visit.    Review of Systems  Respiratory:  Positive for cough and shortness of breath.   Cardiovascular:  Negative for chest pain and leg swelling.  Neurological:  Positive for light-headedness.  Negative for headaches.  Psychiatric/Behavioral:  The patient is nervous/anxious.       Objective    BP (!) 159/67 (BP Location: Left Arm, Patient Position: Sitting, Cuff Size: Normal)   Pulse 75   Temp 97.6 F (36.4 C) (Temporal)   Resp 16   Wt 103 lb 9.6 oz (47 kg)   SpO2 99%   BMI 18.95 kg/m  {Show previous vital signs (optional):23777}  Physical Exam Vitals reviewed.  Constitutional:      General: She is not in acute distress.    Appearance: Normal appearance. She is not ill-appearing or toxic-appearing.  HENT:     Head: Normocephalic and atraumatic.     Right Ear: External ear normal.     Left Ear: External ear normal.     Nose: Nose normal.     Mouth/Throat:     Mouth: Mucous membranes are moist.     Pharynx: Oropharynx is clear. No oropharyngeal exudate or posterior oropharyngeal erythema.  Eyes:     General: No scleral icterus.    Extraocular Movements: Extraocular movements intact.     Conjunctiva/sclera: Conjunctivae normal.     Pupils: Pupils are equal, round, and reactive to light.  Cardiovascular:     Rate and Rhythm: Normal rate. Rhythm irregular.     Pulses: Normal pulses.     Heart sounds: Normal heart sounds. No murmur heard.   No friction rub. No gallop.  Pulmonary:     Effort: Pulmonary effort is normal.  No respiratory distress.     Breath sounds: Normal breath sounds. No wheezing or rhonchi.  Chest:     Chest wall: No tenderness.  Abdominal:     General: Abdomen is flat. There is no distension.     Palpations: Abdomen is soft.     Tenderness: There is no abdominal tenderness.  Musculoskeletal:        General: Normal range of motion.     Cervical back: Normal range of motion and neck supple.     Right lower leg: No edema.     Left lower leg: No edema.  Skin:    General: Skin is warm and dry.     Capillary Refill: Capillary refill takes less than 2 seconds.     Findings: No lesion or rash.  Neurological:     General: No focal deficit  present.     Mental Status: She is alert and oriented to person, place, and time. Mental status is at baseline.  Psychiatric:        Behavior: Behavior normal.        Thought Content: Thought content normal.        Judgment: Judgment normal.     No results found for any visits on 03/12/21.  Assessment & Plan     Problem List Items Addressed This Visit       Cardiovascular and Mediastinum   Benign hypertension    Home BPs slightly elevated Elevated BP likely due to stress and anxiety Continue to monitor home BP Continue HCTZ 25mg  and losartan 100mg  Recent metabolic panel wnl F/u in 6 weeks        Other   Adjustment disorder with anxious mood    New onset, poorly controlled Start sertraline 50mg  Continue ativan as needed      Irregular heart beat - Primary    F/b cardiology Known history of PACs        Return in about 6 weeks (around 04/23/2021) for chronic disease f/u.      , Medical Student 03/12/2021, 3:11 PM   Patient seen along with MS3 student 04/25/2021. I personally evaluated this patient along with the student, and verified all aspects of the history, physical exam, and medical decision making as documented by the student. I agree with the student's documentation and have made all necessary edits.  Litsy Epting, Gwyndolyn Kaufman, MD, MPH [facility]

## 2021-03-23 ENCOUNTER — Other Ambulatory Visit: Payer: Self-pay | Admitting: Family Medicine

## 2021-03-23 DIAGNOSIS — E876 Hypokalemia: Secondary | ICD-10-CM

## 2021-03-23 NOTE — Telephone Encounter (Signed)
Requested Prescriptions  Pending Prescriptions Disp Refills   potassium chloride SA (KLOR-CON M) 20 MEQ tablet [Pharmacy Med Name: POTASSIUM CL ER 20 MEQ TABLET] 180 tablet 0    Sig: TAKE ONE (1) TABLET BY MOUTH TWO (2) TIMES DAILY     Endocrinology:  Minerals - Potassium Supplementation Passed - 03/23/2021  9:27 AM      Passed - K in normal range and within 360 days    Potassium  Date Value Ref Range Status  02/06/2021 3.7 3.5 - 5.2 mmol/L Final  02/21/2013 3.6 3.5 - 5.1 mmol/L Final         Passed - Cr in normal range and within 360 days    Creatinine  Date Value Ref Range Status  02/21/2013 0.77 0.60 - 1.30 mg/dL Final   Creatinine, Ser  Date Value Ref Range Status  02/06/2021 0.93 0.57 - 1.00 mg/dL Final         Passed - Valid encounter within last 12 months    Recent Outpatient Visits          1 week ago Irregular heart beat   [facility] [place], Marzella Schlein, MD   1 month ago Encounter for annual wellness visit (AWV) in Medicare patient   [facility] [place], Marzella Schlein, MD   7 months ago Benign hypertension   [facility] [place], Marzella Schlein, MD   1 year ago Encounter for annual physical exam   [facility] [place], Marzella Schlein, MD   1 year ago Benign hypertension   [facility] [place], Marzella Schlein, MD      Future Appointments            In 3 weeks Bacigalupo, Marzella Schlein, MD [facility]   In 4 months Bacigalupo, Marzella Schlein, MD [facility]

## 2021-03-30 ENCOUNTER — Encounter: Payer: Self-pay | Admitting: Obstetrics and Gynecology

## 2021-03-30 ENCOUNTER — Ambulatory Visit (INDEPENDENT_AMBULATORY_CARE_PROVIDER_SITE_OTHER): Payer: Medicare PPO | Admitting: Obstetrics and Gynecology

## 2021-03-30 ENCOUNTER — Other Ambulatory Visit: Payer: Self-pay

## 2021-03-30 VITALS — BP 120/70 | Ht 64.0 in | Wt 104.0 lb

## 2021-03-30 DIAGNOSIS — N904 Leukoplakia of vulva: Secondary | ICD-10-CM | POA: Diagnosis not present

## 2021-03-30 NOTE — Progress Notes (Signed)
Patient ID: Susan Booth, female   DOB: 11-Apr-1930, 87 y.o.   MRN: [id number]  Reason for Consult: Follow-up   Referred by Virginia Crews, MD  Subjective:     HPI:  Susan Booth is a 87 y.o. female she is following up today for monitoring of lichen sclerosis.  She reports that she has been compliant with applying daily steroid ointment.  She reports that her bottom is feeling much improved.  Gynecological History  No LMP recorded. Patient has had a hysterectomy.  Past Medical History:  Diagnosis Date   Anxiety    Benign essential tremor    Complication of anesthesia    nausea   Gastroesophageal reflux disease    Hypertension    Lichen sclerosus et atrophicus of the vulva    Murmur    Need for SBE (subacute bacterial endocarditis) prophylaxis    Osteoporosis, post-menopausal    Other chest pain    Paresthesia    Rhinitis, allergic    Family History  Problem Relation Age of Onset   COPD Mother    Early death Mother    Lung cancer Mother    Heart disease Father    Heart attack Father    Heart disease Sister        heart valve problem   Dementia Sister    Breast cancer Maternal Aunt 70   Prostate cancer Son    Prostate cancer Son    Past Surgical History:  Procedure Laterality Date   ABDOMINAL HYSTERECTOMY  1968   ASD REPAIR, SECUNDUM  03/2007   CATARACT EXTRACTION Bilateral 06/2012   CORRECTION HAMMER TOE Bilateral 2003   hole in heart repaired     continue to have some leakage   ORIF PATELLA Left 11/24/2015   Procedure: OPEN REDUCTION INTERNAL (ORIF) FIXATION PATELLA;  Surgeon: Thornton Park, MD;  Location: [facility] ORS;  Service: Orthopedics;  Laterality: Left;  (PER Dr Tanja Port Spinal would be ideal     Short Social History:  Social History   Tobacco Use   Smoking status: Never   Smokeless tobacco: Never  Substance Use Topics   Alcohol use: Yes    Comment: rare - 1 glass of wine    No Known Allergies  Current Outpatient  Medications  Medication Sig Dispense Refill   aspirin EC 81 MG tablet Take 81 mg by mouth daily.     Calcium Carbonate-Vitamin D 600-400 MG-UNIT per tablet CALTRATE 600+D, 600-400MG -UNIT (Oral Tablet)  1 Every Day for 0 days  Quantity: 0.00;  Refills: 0   Ordered :12-Jan-2010  Abran Richard ;  Started 15-Aug-2008 Active Comments: DX: 733.90     clobetasol ointment (TEMOVATE) 0.05 % Apply to affected area every night for 4 weeks, then every other day for 4 weeks and then twice a week for 4 weeks or until resolution. 30 g 5   fluticasone (FLONASE) 50 MCG/ACT nasal spray Place 1 spray into both nostrils daily as needed. 48 g 3   hydrochlorothiazide (HYDRODIURIL) 25 MG tablet Take 1 tablet (25 mg total) by mouth daily. 90 tablet 3   LORazepam (ATIVAN) 0.5 MG tablet Take 1 tablet (0.5 mg total)by mouth 2 (two) times daily as needed for anxiety. 60 tablet 2   losartan (COZAAR) 100 MG tablet Take 1 tablet (100 mg total) by mouth daily. 90 tablet 3   potassium chloride SA (KLOR-CON M) 20 MEQ tablet TAKE ONE (1) TABLET BY MOUTH TWO (2) TIMES DAILY 180 tablet 0  sertraline (ZOLOFT) 50 MG tablet Take 1 tablet (50 mg total) by mouth daily. 30 tablet 3   No current facility-administered medications for this visit.    Review of Systems  Constitutional: Negative for chills, fatigue, fever and unexpected weight change.  HENT: Negative for trouble swallowing.  Eyes: Negative for loss of vision.  Respiratory: Negative for cough, shortness of breath and wheezing.  Cardiovascular: Negative for chest pain, leg swelling, palpitations and syncope.  GI: Negative for abdominal pain, blood in stool, diarrhea, nausea and vomiting.  GU: Negative for difficulty urinating, dysuria, frequency and hematuria.  Musculoskeletal: Negative for back pain, leg pain and joint pain.  Skin: Negative for rash.  Neurological: Negative for dizziness, headaches, light-headedness, numbness and seizures.  Psychiatric: Negative for  behavioral problem, confusion, depressed mood and sleep disturbance.       Objective:  Objective   Vitals:   03/30/21 1110  BP: 120/70  Weight: 104 lb (47.2 kg)  Height: 5\' 4"  (1.626 m)   Body mass index is 17.85 kg/m.  Physical Exam Vitals and nursing note reviewed. Exam conducted with a chaperone present.  Constitutional:      Appearance: Normal appearance. She is well-developed.  HENT:     Head: Normocephalic and atraumatic.  Eyes:     Extraocular Movements: Extraocular movements intact.     Pupils: Pupils are equal, round, and reactive to light.  Cardiovascular:     Rate and Rhythm: Normal rate and regular rhythm.  Pulmonary:     Effort: Pulmonary effort is normal. No respiratory distress.     Breath sounds: Normal breath sounds.  Abdominal:     General: Abdomen is flat.     Palpations: Abdomen is soft.  Genitourinary:    Comments: Small bilateral petechia on labia.  Improvement overall of the vulva.  Significantly less skin whitening.  Some return of labial definition bilaterally.  Inferior vestibule still has loss of pigmentation.  Encouraged to focus placement of clobetasol in this area. Musculoskeletal:        General: No signs of injury.  Skin:    General: Skin is warm and dry.  Neurological:     Mental Status: She is alert and oriented to person, place, and time.  Psychiatric:        Behavior: Behavior normal.        Thought Content: Thought content normal.        Judgment: Judgment normal.    Assessment/Plan:     87 year old with vulvar lichen sclerosus following up for monitoring as she continues daily treatment.  Labial definition improving Bilateral small petechia Small erosion at inferior vestibule- focused application of ointment in this area Continue daily steroid use  Follow up in 4 weeks  More than 20 minutes were spent face to face with the patient in the room, reviewing the medical record, labs and images, and coordinating care for the  patient. The plan of management was discussed in detail and counseling was provided.   98 MD, Adelene Idler OB/GYN, [facility] 03/30/2021 11:29 AM

## 2021-04-04 ENCOUNTER — Other Ambulatory Visit: Payer: Self-pay

## 2021-04-04 ENCOUNTER — Ambulatory Visit: Payer: Medicare PPO | Admitting: Gastroenterology

## 2021-04-04 ENCOUNTER — Encounter: Payer: Self-pay | Admitting: Gastroenterology

## 2021-04-04 VITALS — BP 162/83 | HR 91 | Temp 97.4°F | Ht 64.0 in | Wt 105.2 lb

## 2021-04-04 DIAGNOSIS — K529 Noninfective gastroenteritis and colitis, unspecified: Secondary | ICD-10-CM | POA: Diagnosis not present

## 2021-04-04 DIAGNOSIS — R140 Abdominal distension (gaseous): Secondary | ICD-10-CM | POA: Diagnosis not present

## 2021-04-04 DIAGNOSIS — R109 Unspecified abdominal pain: Secondary | ICD-10-CM | POA: Diagnosis not present

## 2021-04-04 NOTE — Patient Instructions (Signed)
Lactose-Free Diet, Adult If you have lactose intolerance, you are not able to digest lactose. Lactose is a natural sugar found mainly in dairy milk and dairy products. A lactose-freediet can help you avoid foods and beverages that contain lactose. What are tips for following this plan? Reading food labels Do not consume foods, beverages, vitamins, minerals, or medicines containing lactose. Read ingredient lists carefully. Look for the words "lactose-free" on labels. Meal planning Use alternatives to dairy milk and foods made with milk products. These include the following: Lactose-free milk. Soy milk with added calcium and vitamin D. Almond milk, coconut milk, rice milk, or other nondairy milk alternatives with added calcium and vitamin D. Note that a lot of these are low in protein. Soy products, such as soy yogurt, soy cheese, soy ice cream, and soy-based sour cream. Other nut milk products, such as almond yogurt, almond cheese, cashew yogurt, cashew cheese, cashew ice cream, coconut yogurt, and coconut ice cream. Medicines, vitamins, and supplements Use lactase enzyme drops or tablets as directed by your health care provider. Make sure you get enough calcium and vitamin D in your diet. A lactose-free eating plan can be lacking in these important nutrients. Take calcium and vitamin D supplements as directed by your health care provider. Talk with your health care provider about supplements if you are not able to get enough calcium and vitamin D from food. What foods should I eat?  Fruits All fresh, canned, frozen, or dried fruits and fruit juices that are notprocessed with lactose. Vegetables All fresh, frozen, and canned vegetables without cheese, cream, or buttersauces. Grains Any that are not made with dairy milk or dairy products. Meats and other proteins Any meat, fish, poultry, and other protein sources that are not made with dairymilk or dairy products. Fats and oils Any that are  not made with dairy milk or dairy products. Sweets and desserts Any that are not made with dairy milk or dairy products. Seasonings and condiments Any that are not made with dairy milk or dairy products. Calcium Calcium is found in many foods that contain lactose and is important for bone health. The amount of calcium you need depends on your age: Adults younger than 50 years: 1,000 mg of calcium a day. Adults older than 50 years: 1,200 mg of calcium a day. If you are not getting enough calcium, you may get it from other sources, including: Orange juice that has been fortified with calcium. This means that calcium has been added to the product. There are 300-350 mg of calcium in 1 cup (237 mL) of calcium-fortified orange juice. Soy milk fortified with calcium. There are 300-400 mg of calcium in 1 cup (237 mL) of calcium-fortified soy milk. Rice or almond milk fortified with calcium. There are 300 mg of calcium in 1 cup (237 mL) of calcium-fortified rice or almond milk. Breakfast cereals fortified with calcium. There are 100-1,000 mg of calcium in calcium-fortified breakfast cereals. Spinach, cooked. There are 145 mg of calcium in  cup (90 g) of cooked spinach. Edamame, cooked. There are 130 mg of calcium in  cup (47 g) of cooked edamame. Collard greens, cooked. There are 125 mg of calcium in  cup (85 g) of cooked collard greens. Kale, frozen or cooked. There are 90 mg of calcium in  cup (59 g) of cooked or frozen kale. Almonds. There are 95 mg of calcium in  cup (35 g) of almonds. Broccoli, cooked. There are 60 mg of calcium in 1 cup (156   g) of cooked broccoli. The items listed above may not be a complete list of foods and beverages you can eat. Contact a dietitian for more options. What foods should I avoid? Lactose is found in dairy milk and dairy products, such as: Yogurt. Cheese. Butter. Margarine. Sour cream. Cream. Whipped toppings and creamers. Ice cream and other  dairy-based desserts. Lactose is also found in foods or products made with dairy milk or milk ingredients. To find out whether a food contains dairy milk or a milk ingredient, look at the ingredients list. Avoid foods with the statement "May contain milk" and foods that contain: Milk powder. Whey. Curd. Lactose. Lactoglobulin. The items listed above may not be a complete list of foods and beverages to avoid. Contact a dietitian for more information. Where to find more information National Institute of Diabetes and Digestive and Kidney Diseases: www.niddk.nih.gov Summary If you are lactose intolerant, it means that you are not able to digest lactose, a natural sugar found in milk and milk products. Following a lactose-free diet can help you manage this condition. Calcium is important for bone health and is found in many foods that contain lactose. Talk with your health care provider about other sources of calcium. This information is not intended to replace advice given to you by your health care provider. Make sure you discuss any questions you have with your healthcare provider. Document Revised: 02/22/2020 Document Reviewed: 02/22/2020 Elsevier Patient Education  2022 Elsevier Inc.  

## 2021-04-04 NOTE — Progress Notes (Signed)
Susan Darby, MD [address]  [place], [place] [address]  Main: [phone]  Fax: [phone]    Gastroenterology Consultation  Referring Provider:     Virginia Crews, MD Primary Care Physician:  Susan Crews, MD Primary Gastroenterologist:  Dr. Cephas Booth Reason for Consultation:     Chronic diarrhea        HPI:   Susan Booth is a 88 y.o. female referred by Dr. Brita Booth, Susan Bucy, MD  for consultation & management of chronic diarrhea.  Patient reports that for almost 1 year, she has been experiencing nonbloody loose stools about 3 to 4/day or sometimes once a day.  The symptoms occur every 1 to 2 weeks and are sporadic, associated with abdominal bloating and cramps.  Denies any rectal bleeding, abdominal pain or weight loss.  She likes to eat cheese regularly along with ice cream.  Patient was treated with antibiotics for UTI in 09/2020.  She reports having diarrhea prior to episode of UTI.  Her labs are unremarkable  Patient does not smoke or drink alcohol  NSAIDs: None  Antiplts/Anticoagulants/Anti thrombotics: None  GI Procedures: Colonoscopy in 2004  Past Medical History:  Diagnosis Date   Anxiety    Benign essential tremor    Complication of anesthesia    nausea   Gastroesophageal reflux disease    Hypertension    Lichen sclerosus et atrophicus of the vulva    Murmur    Need for SBE (subacute bacterial endocarditis) prophylaxis    Osteoporosis, post-menopausal    Other chest pain    Paresthesia    Rhinitis, allergic     Past Surgical History:  Procedure Laterality Date   ABDOMINAL HYSTERECTOMY  1968   ASD REPAIR, SECUNDUM  03/2007   CATARACT EXTRACTION Bilateral 06/2012   CORRECTION HAMMER TOE Bilateral 2003   hole in heart repaired     continue to have some leakage   ORIF PATELLA Left 11/24/2015   Procedure: OPEN REDUCTION INTERNAL (ORIF) FIXATION PATELLA;  Surgeon: Thornton Park, MD;  Location: [facility] ORS;   Service: Orthopedics;  Laterality: Left;  (PER Dr Tanja Port Spinal would be ideal    Current Outpatient Medications:    aspirin EC 81 MG tablet, Take 81 mg by mouth daily., Disp: , Rfl:    Calcium Carbonate-Vitamin D 600-400 MG-UNIT per tablet, CALTRATE 600+D, 600-400MG -UNIT (Oral Tablet)  1 Every Day for 0 days  Quantity: 0.00;  Refills: 0   Ordered :12-Jan-2010  Abran Richard ;  Started 15-Aug-2008 Active Comments: DX: 733.90, Disp: , Rfl:    clobetasol ointment (TEMOVATE) 0.05 %, Apply to affected area every night for 4 weeks, then every other day for 4 weeks and then twice a week for 4 weeks or until resolution., Disp: 30 g, Rfl: 5   fluticasone (FLONASE) 50 MCG/ACT nasal spray, Place 1 spray into both nostrils daily as needed., Disp: 48 g, Rfl: 3   hydrochlorothiazide (HYDRODIURIL) 25 MG tablet, Take 1 tablet (25 mg total) by mouth daily., Disp: 90 tablet, Rfl: 3   LORazepam (ATIVAN) 0.5 MG tablet, Take 1 tablet (0.5 mg total)by mouth 2 (two) times daily as needed for anxiety., Disp: 60 tablet, Rfl: 2   losartan (COZAAR) 100 MG tablet, Take 1 tablet (100 mg total) by mouth daily., Disp: 90 tablet, Rfl: 3   potassium chloride SA (KLOR-CON M) 20 MEQ tablet, TAKE ONE (1) TABLET BY MOUTH TWO (2) TIMES DAILY, Disp: 180 tablet, Rfl: 0  sertraline (ZOLOFT) 50 MG tablet, Take 1 tablet (50 mg total) by mouth daily., Disp: 30 tablet, Rfl: 3    Family History  Problem Relation Age of Onset   COPD Mother    Early death Mother    Lung cancer Mother    Heart disease Father    Heart attack Father    Heart disease Sister        heart valve problem   Dementia Sister    Breast cancer Maternal Aunt 70   Prostate cancer Son    Prostate cancer Son      Social History   Tobacco Use   Smoking status: Never   Smokeless tobacco: Never  Vaping Use   Vaping Use: Never used  Substance Use Topics   Alcohol use: Yes    Comment: rare - 1 glass of wine   Drug use: No    Allergies as of 04/04/2021    (No Known Allergies)    Review of Systems:    All systems reviewed and negative except where noted in HPI.   Physical Exam:  BP (!) 162/83 (BP Location: Left Arm, Patient Position: Sitting, Cuff Size: Normal)    Pulse 91    Temp (!) 97.4 F (36.3 C) (Oral)    Ht 5\' 4"  (1.626 m)    Wt 105 lb 4 oz (47.7 kg)    BMI 18.07 kg/m  No LMP recorded. Patient has had a hysterectomy.  General:   Alert, thin built, moderately nourished, pleasant and cooperative in NAD Head:  Normocephalic and atraumatic. Eyes:  Sclera clear, no icterus.   Conjunctiva pink. Ears:  Normal auditory acuity. Nose:  No deformity, discharge, or lesions. Mouth:  No deformity or lesions,oropharynx pink & moist. Neck:  Supple; no masses or thyromegaly. Lungs:  Respirations even and unlabored.  Clear throughout to auscultation.   No wheezes, crackles, or rhonchi. No acute distress. Heart:  Regular rate and rhythm; no murmurs, clicks, rubs, or gallops. Abdomen:  Normal bowel sounds. Soft, non-tender and non-distended without masses, hepatosplenomegaly or hernias noted.  No guarding or rebound tenderness.   Rectal: Not performed Msk:  Symmetrical without gross deformities. Good, equal movement & strength bilaterally. Pulses:  Normal pulses noted. Extremities:  No clubbing or edema.  No cyanosis. Neurologic:  Alert and oriented x3;  grossly normal neurologically. Skin:  Intact without significant lesions or rashes. No jaundice. Psych:  Alert and cooperative. Normal mood and affect.  Imaging Studies: No recent abdominal imaging  Assessment and Plan:   Susan Booth is a 88 y.o. pleasant Caucasian female with history of hypertension, osteoporosis is seen in consultation for approximately 1 year history of nonbloody loose stools associated with abdominal bloating, cramps without anemia, weight loss or rectal bleeding.  Discussed with patient regarding 2 weeks trial of strict lactose-free diet, information provided.  If her  symptoms are persistent, she will call my office in 2 weeks to proceed with further work-up.  We will then order GI profile PCR, pancreatic fecal elastase levels, calprotectin levels and possibly colonoscopy to rule out microscopic colitis   Follow up as needed   Susan Darby, MD

## 2021-04-12 ENCOUNTER — Telehealth: Payer: Self-pay | Admitting: Family Medicine

## 2021-04-12 MED ORDER — HYDROCHLOROTHIAZIDE 25 MG PO TABS: 25.0000 mg | ORAL_TABLET | Freq: Every day | ORAL | 1 refills | Status: DC

## 2021-04-12 NOTE — Telephone Encounter (Signed)
[facility] faxed refill request for the following medications:  hydrochlorothiazide (HYDRODIURIL) 25 MG tablet   Please advise.

## 2021-04-17 NOTE — Progress Notes (Signed)
I,Roshena L Chambers,acting as a scribe for Lavon Paganini, MD.,have documented all relevant documentation on the behalf of Lavon Paganini, MD,as directed by  Lavon Paganini, MD while in the presence of Lavon Paganini, MD.    Established patient visit   Patient: Susan Booth   DOB: 10/25/1930   88 y.o. Female  MRN: [id number] Visit Date: 04/19/2021  Today's healthcare provider: Lavon Paganini, MD   Chief Complaint  Patient presents with   Hypertension   Subjective    HPI  Hypertension, follow-up  BP Readings from Last 3 Encounters:  04/19/21 (!) 151/69  04/04/21 (!) 162/83  03/30/21 120/70   Wt Readings from Last 3 Encounters:  04/19/21 103 lb (46.7 kg)  04/04/21 105 lb 4 oz (47.7 kg)  03/30/21 104 lb (47.2 kg)     She was last seen for hypertension 6 weeks ago.  BP at that visit was 159/67. Management since that visit includes Continue to monitor home BP Continue HCTZ 33m and losartan 1061m  She reports good compliance with treatment. She is not having side effects.  She is following a Regular diet.  Outside blood pressures are 15938ystolic. Patient unsure of diastolic readings.. Symptoms: No chest pain No chest pressure  No palpitations No syncope  Yes dyspnea No orthopnea  No paroxysmal nocturnal dyspnea No lower extremity edema   Pertinent labs: Lab Results  Component Value Date   CHOL 170 02/06/2021   HDL 55 02/06/2021   LDLCALC 107 (H) 02/06/2021   TRIG 40 02/06/2021   CHOLHDL 3.1 01/28/2020   Lab Results  Component Value Date   NA 138 02/06/2021   K 3.7 02/06/2021   CREATININE 0.93 02/06/2021   EGFR 59 (L) 02/06/2021   GLUCOSE 94 02/06/2021   TSH 1.580 11/24/2017     The ASCVD Risk score (Arnett DK, et al., 2019) failed to calculate for the following reasons:   The 2019 ASCVD risk score is only valid for ages 4037o 7949  ---------------------------------------------------------------------------------------------------   Medications: Outpatient Medications Prior to Visit  Medication Sig   aspirin EC 81 MG tablet Take 81 mg by mouth daily.   Calcium Carbonate-Vitamin D 600-400 MG-UNIT per tablet CALTRATE 600+D, 600-400MG-UNIT (Oral Tablet)  1 Every Day for 0 days  Quantity: 0.00;  Refills: 0   Ordered :12-Jan-2010  HaAbran Richard  Started 15-Aug-2008 Active Comments: DX: 733.90   clobetasol ointment (TEMOVATE) 0.05 % Apply to affected area every night for 4 weeks, then every other day for 4 weeks and then twice a week for 4 weeks or until resolution.   fluticasone (FLONASE) 50 MCG/ACT nasal spray Place 1 spray into both nostrils daily as needed.   LORazepam (ATIVAN) 0.5 MG tablet Take 1 tablet (0.5 mg total)by mouth 2 (two) times daily as needed for anxiety.   potassium chloride SA (KLOR-CON M) 20 MEQ tablet TAKE ONE (1) TABLET BY MOUTH TWO (2) TIMES DAILY   [DISCONTINUED] hydrochlorothiazide (HYDRODIURIL) 25 MG tablet Take 1 tablet (25 mg total) by mouth daily.   [DISCONTINUED] losartan (COZAAR) 100 MG tablet Take 1 tablet (100 mg total) by mouth daily.   [DISCONTINUED] sertraline (ZOLOFT) 50 MG tablet Take 1 tablet (50 mg total) by mouth daily.   No facility-administered medications prior to visit.    Review of Systems  Constitutional:  Negative for appetite change, chills, fatigue and fever.  Respiratory:  Positive for shortness of breath (during exertion). Negative for chest tightness.  Cardiovascular:  Negative for chest pain and palpitations.  Gastrointestinal:  Negative for abdominal pain, nausea and vomiting.  Neurological:  Negative for dizziness and weakness.      Objective    BP (!) 151/69 (BP Location: Right Arm, Patient Position: Sitting, Cuff Size: Normal)    Pulse 68    Temp 97.8 F (36.6 C) (Oral)    Resp 16    Wt 103 lb (46.7 kg)    SpO2 98% Comment: room air   BMI 17.68 kg/m   {Show previous vital signs (optional):23777}  Physical Exam Vitals reviewed.  Constitutional:      General: She is not in acute distress.    Appearance: Normal appearance. She is well-developed. She is not diaphoretic.  HENT:     Head: Normocephalic and atraumatic.  Eyes:     General: No scleral icterus.    Conjunctiva/sclera: Conjunctivae normal.  Neck:     Thyroid: No thyromegaly.  Cardiovascular:     Rate and Rhythm: Normal rate and regular rhythm.     Heart sounds: Murmur heard.  Pulmonary:     Effort: Pulmonary effort is normal. No respiratory distress.     Breath sounds: Normal breath sounds. No wheezing, rhonchi or rales.  Musculoskeletal:     Cervical back: Neck supple.     Right lower leg: No edema.     Left lower leg: No edema.  Lymphadenopathy:     Cervical: No cervical adenopathy.  Skin:    General: Skin is warm and dry.     Findings: No rash.  Neurological:     Mental Status: She is alert and oriented to person, place, and time. Mental status is at baseline.  Psychiatric:        Mood and Affect: Mood normal.        Behavior: Behavior normal.      No results found for any visits on 04/19/21.  Assessment & Plan     Problem List Items Addressed This Visit       Cardiovascular and Mediastinum   Benign hypertension    Home BPs remain elevated in 500B and 704U systolic Only one reading <130/90, so think it is safe to titrate meds Continue losartan-hctz 100-25 - will switch to combo pill though Add coreg 3.164m BID Did not tolerate amlodipine previously due to LE edema Recheck in 6 wks      Relevant Medications   losartan-hydrochlorothiazide (HYZAAR) 100-25 MG tablet   carvedilol (COREG) 3.125 MG tablet   Primary pulmonary hypertension (HCC) - Primary    F/b Cardiology      Relevant Medications   losartan-hydrochlorothiazide (HYZAAR) 100-25 MG tablet   carvedilol (COREG) 3.125 MG tablet   PAC (premature atrial contraction)    F/b cardiology       Relevant Medications   losartan-hydrochlorothiazide (HYZAAR) 100-25 MG tablet   carvedilol (COREG) 3.125 MG tablet   Aortic valve insufficiency    F/b cardiology      Relevant Medications   losartan-hydrochlorothiazide (HYZAAR) 100-25 MG tablet   carvedilol (COREG) 3.125 MG tablet     Other   Adjustment disorder with anxious mood    Stable Still having a lot of stress with the health of her family members Did not tolerate zoloft due to diarrhea        Return in about 6 weeks (around 05/31/2021) for BP f/u.      I, ALavon Paganini MD, have reviewed all documentation for this visit. The documentation on 04/19/21 for the  procedures, and orders are all accurate and complete. ° ° °,  M, MD, MPH °[facility] °[facility]   °

## 2021-04-19 ENCOUNTER — Encounter: Payer: Self-pay | Admitting: Family Medicine

## 2021-04-19 ENCOUNTER — Other Ambulatory Visit: Payer: Self-pay

## 2021-04-19 ENCOUNTER — Ambulatory Visit: Payer: Medicare PPO | Admitting: Family Medicine

## 2021-04-19 VITALS — BP 151/69 | HR 68 | Temp 97.8°F | Resp 16 | Wt 103.0 lb

## 2021-04-19 DIAGNOSIS — F4322 Adjustment disorder with anxiety: Secondary | ICD-10-CM | POA: Diagnosis not present

## 2021-04-19 DIAGNOSIS — I491 Atrial premature depolarization: Secondary | ICD-10-CM

## 2021-04-19 DIAGNOSIS — I10 Essential (primary) hypertension: Secondary | ICD-10-CM | POA: Diagnosis not present

## 2021-04-19 DIAGNOSIS — I270 Primary pulmonary hypertension: Secondary | ICD-10-CM | POA: Diagnosis not present

## 2021-04-19 DIAGNOSIS — I351 Nonrheumatic aortic (valve) insufficiency: Secondary | ICD-10-CM

## 2021-04-19 MED ORDER — CARVEDILOL 3.125 MG PO TABS: 3.1250 mg | ORAL_TABLET | Freq: Two times a day (BID) | ORAL | 3 refills | Status: DC

## 2021-04-19 MED ORDER — LOSARTAN POTASSIUM-HCTZ 100-25 MG PO TABS: 1.0000 | ORAL_TABLET | Freq: Every day | ORAL | 1 refills | Status: DC

## 2021-04-19 NOTE — Assessment & Plan Note (Signed)
Stable Still having a lot of stress with the health of her family members Did not tolerate zoloft due to diarrhea

## 2021-04-19 NOTE — Assessment & Plan Note (Signed)
F/b Cardiology 

## 2021-04-19 NOTE — Assessment & Plan Note (Signed)
F/b cardiology 

## 2021-04-19 NOTE — Assessment & Plan Note (Addendum)
Home BPs remain elevated in 140s and 150s systolic Only one reading <130/90, so think it is safe to titrate meds Continue losartan-hctz 100-25 - will switch to combo pill though Add coreg 3.125mg  BID Did not tolerate amlodipine previously due to LE edema Recheck in 6 wks

## 2021-04-27 ENCOUNTER — Other Ambulatory Visit: Payer: Self-pay

## 2021-04-27 ENCOUNTER — Ambulatory Visit: Payer: Medicare PPO | Admitting: Obstetrics and Gynecology

## 2021-04-27 ENCOUNTER — Encounter: Payer: Self-pay | Admitting: Obstetrics and Gynecology

## 2021-04-27 VITALS — BP 100/70 | Wt 105.2 lb

## 2021-04-27 DIAGNOSIS — N904 Leukoplakia of vulva: Secondary | ICD-10-CM | POA: Diagnosis not present

## 2021-04-27 NOTE — Progress Notes (Signed)
Patient ID: Susan Booth, female   DOB: 04-11-1930, 88 y.o.   MRN: [id number]  Reason for Consult: Gynecologic Exam   Referred by Virginia Crews, MD  Subjective:     HPI:  Susan Booth is a 88 y.o. female she is following up for monitoring of lichen sclerosis.  She is currently applying steroid ointment daily.  She reports that her bottom has been feeling much better.  Gynecological History  No LMP recorded. Patient has had a hysterectomy.  Past Medical History:  Diagnosis Date   Anxiety    Benign essential tremor    Complication of anesthesia    nausea   Gastroesophageal reflux disease    Hypertension    Lichen sclerosus et atrophicus of the vulva    Murmur    Need for SBE (subacute bacterial endocarditis) prophylaxis    Osteoporosis, post-menopausal    Other chest pain    Paresthesia    Rhinitis, allergic    Family History  Problem Relation Age of Onset   COPD Mother    Early death Mother    Lung cancer Mother    Heart disease Father    Heart attack Father    Heart disease Sister        heart valve problem   Dementia Sister    Breast cancer Maternal Aunt 70   Prostate cancer Son    Prostate cancer Son    Past Surgical History:  Procedure Laterality Date   ABDOMINAL HYSTERECTOMY  1968   ASD REPAIR, SECUNDUM  03/2007   CATARACT EXTRACTION Bilateral 06/2012   CORRECTION HAMMER TOE Bilateral 2003   hole in heart repaired     continue to have some leakage   ORIF PATELLA Left 11/24/2015   Procedure: OPEN REDUCTION INTERNAL (ORIF) FIXATION PATELLA;  Surgeon: Thornton Park, MD;  Location: [facility] ORS;  Service: Orthopedics;  Laterality: Left;  (PER Dr Tanja Port Spinal would be ideal     Short Social History:  Social History   Tobacco Use   Smoking status: Never   Smokeless tobacco: Never  Substance Use Topics   Alcohol use: Yes    Comment: rare - 1 glass of wine    No Known Allergies  Current Outpatient Medications  Medication Sig  Dispense Refill   aspirin EC 81 MG tablet Take 81 mg by mouth daily.     Calcium Carbonate-Vitamin D 600-400 MG-UNIT per tablet CALTRATE 600+D, 600-400MG -UNIT (Oral Tablet)  1 Every Day for 0 days  Quantity: 0.00;  Refills: 0   Ordered :12-Jan-2010  Abran Richard ;  Started 15-Aug-2008 Active Comments: DX: 733.90     carvedilol (COREG) 3.125 MG tablet Take 1 tablet (3.125 mg total) by mouth 2 (two) times daily with a meal. 60 tablet 3   clobetasol ointment (TEMOVATE) 0.05 % Apply to affected area every night for 4 weeks, then every other day for 4 weeks and then twice a week for 4 weeks or until resolution. 30 g 5   fluticasone (FLONASE) 50 MCG/ACT nasal spray Place 1 spray into both nostrils daily as needed. 48 g 3   LORazepam (ATIVAN) 0.5 MG tablet Take 1 tablet (0.5 mg total)by mouth 2 (two) times daily as needed for anxiety. 60 tablet 2   losartan-hydrochlorothiazide (HYZAAR) 100-25 MG tablet Take 1 tablet by mouth daily. 90 tablet 1   potassium chloride SA (KLOR-CON M) 20 MEQ tablet TAKE ONE (1) TABLET BY MOUTH TWO (2) TIMES DAILY 180 tablet 0   No  current facility-administered medications for this visit.    Review of Systems  Constitutional: Negative for chills, fatigue, fever and unexpected weight change.  HENT: Negative for trouble swallowing.  Eyes: Negative for loss of vision.  Respiratory: Negative for cough, shortness of breath and wheezing.  Cardiovascular: Negative for chest pain, leg swelling, palpitations and syncope.  GI: Negative for abdominal pain, blood in stool, diarrhea, nausea and vomiting.  GU: Negative for difficulty urinating, dysuria, frequency and hematuria.  Musculoskeletal: Negative for back pain, leg pain and joint pain.  Skin: Negative for rash.  Neurological: Negative for dizziness, headaches, light-headedness, numbness and seizures.  Psychiatric: Negative for behavioral problem, confusion, depressed mood and sleep disturbance.       Objective:  Objective    Vitals:   04/27/21 0937  BP: 100/70  Weight: 105 lb 3.2 oz (47.7 kg)   Body mass index is 18.06 kg/m.  Physical Exam Vitals and nursing note reviewed. Exam conducted with a chaperone present.  Constitutional:      Appearance: Normal appearance. She is well-developed.  HENT:     Head: Normocephalic and atraumatic.  Eyes:     Extraocular Movements: Extraocular movements intact.     Pupils: Pupils are equal, round, and reactive to light.  Cardiovascular:     Rate and Rhythm: Normal rate and regular rhythm.  Pulmonary:     Effort: Pulmonary effort is normal. No respiratory distress.     Breath sounds: Normal breath sounds.  Abdominal:     General: Abdomen is flat.     Palpations: Abdomen is soft.  Genitourinary:    Comments: Labial definition improving Bilateral small petechia on labia majora Small erosion at inferior vestibule improving- focused application of ointment in this area Decrease to every other day steroid use Musculoskeletal:        General: No signs of injury.  Skin:    General: Skin is warm and dry.  Neurological:     Mental Status: She is alert and oriented to person, place, and time.  Psychiatric:        Behavior: Behavior normal.        Thought Content: Thought content normal.        Judgment: Judgment normal.    Assessment/Plan:     88 yo with lichen sclerosis  Labial definition and pigmentation improving Bilateral small petechia on labia majora Small erosion at inferior vestibule improving- focused application of ointment in this area Decrease to every other day steroid use.  Follow up in 2 months for monitoring.   More than 10 minutes were spent face to face with the patient in the room, reviewing the medical record, labs and images, and coordinating care for the patient. The plan of management was discussed in detail and counseling was provided.    Adrian Prows MD [facility], [facility] 04/27/2021 9:48 AM

## 2021-06-01 ENCOUNTER — Telehealth: Payer: Self-pay

## 2021-06-01 NOTE — Telephone Encounter (Signed)
Dr. Leonard Schwartz ? ?Enclosed-blood pressure readings ? ?Also very tired all the time. ?Having shortness of breath ?Feel swollen ?Short pain moving around in my chest area. (Last few weeks) ? ?Could I stop carvedilol? ?Go back on Losartin. ?I have 4 of the Losartain pills left and no refills. [employer]. ? ?My next appt is April 7, 23. ? ?04/30/21 172/82 64 9:35 pm ?05/03/21 141/90 60 12:01 pm ?05/04/21 163/109 61 5:15 pm ?05/07/21 153/82 66 1:00 pm ?05/08/21 147/92 57 10:15 am ?05/11/21 156/93 59 2:10 pm ?05/12/21 133/94 63 2pm ?05/14/21 161/98 64 3:30 pm ?05/15/21 176/107 59 10:25 am ?05/17/21 138/86 76 1 pm ?06/01/21 166/93 62 7:45 am ? ?

## 2021-06-04 ENCOUNTER — Telehealth: Payer: Self-pay | Admitting: Family Medicine

## 2021-06-04 ENCOUNTER — Ambulatory Visit: Payer: Medicare PPO | Admitting: Family Medicine

## 2021-06-04 ENCOUNTER — Encounter: Payer: Self-pay | Admitting: Family Medicine

## 2021-06-04 ENCOUNTER — Other Ambulatory Visit: Payer: Self-pay

## 2021-06-04 VITALS — BP 160/72 | HR 57 | Temp 98.3°F | Resp 16 | Wt 103.0 lb

## 2021-06-04 DIAGNOSIS — R0609 Other forms of dyspnea: Secondary | ICD-10-CM

## 2021-06-04 DIAGNOSIS — F4322 Adjustment disorder with anxiety: Secondary | ICD-10-CM

## 2021-06-04 DIAGNOSIS — I10 Essential (primary) hypertension: Secondary | ICD-10-CM | POA: Diagnosis not present

## 2021-06-04 MED ORDER — LOSARTAN POTASSIUM-HCTZ 100-25 MG PO TABS: 1.0000 | ORAL_TABLET | Freq: Every day | ORAL | 1 refills | Status: DC

## 2021-06-04 MED ORDER — DULOXETINE HCL 20 MG PO CPEP: 20.0000 mg | ORAL_CAPSULE | Freq: Every day | ORAL | 3 refills | Status: DC

## 2021-06-04 NOTE — Telephone Encounter (Signed)
Patient advised as below.  

## 2021-06-04 NOTE — Progress Notes (Signed)
?  ? ?I,Sulibeya S Dimas,acting as a scribe for Lavon Paganini, MD.,have documented all relevant documentation on the behalf of Lavon Paganini, MD,as directed by  Lavon Paganini, MD while in the presence of Lavon Paganini, MD. ? ? ?Established patient visit ? ? ?Patient: Susan Booth   DOB: [birth year]   88 y.o. Female  MRN: [id number] ?Visit Date: 06/04/2021 ? ?Today's healthcare provider: Lavon Paganini, MD  ? ?Chief Complaint  ?Patient presents with  ? Hypertension  ? ?Subjective  ?  ?HPI  ?Patient here today concerned about blood pressure readings. Patient is also C/O shortness of breath with exertion, fatigue, feel swollen, and pain in upper abdominal area (gas pains, intermittent). Patient reports feeling swollen all over body more in legs. None today though ? ?04/30/21 172/82 64 9:35 pm ?05/03/21 141/90 60 12:01 pm ?05/04/21 163/109 61 5:15 pm ?05/07/21 153/82 66 1:00 pm ?05/08/21 147/92 57 10:15 am ?05/11/21 156/93 59 2:10 pm ?05/12/21 133/94 63 2 pm ?05/14/21 161/98 64 3:30 pm ?05/15/21 176/107 59 10:25 am ?05/17/21 138/86 76 1 pm ?06/01/21 166/93 62 7:45 am ? ?Medications: ?Outpatient Medications Prior to Visit  ?Medication Sig  ? aspirin EC 81 MG tablet Take 81 mg by mouth daily.  ? Calcium Carbonate-Vitamin D 600-400 MG-UNIT per tablet CALTRATE 600+D, 600-400MG -UNIT (Oral Tablet)  1 Every Day for 0 days  Quantity: 0.00;  Refills: 0   Ordered :12-Jan-2010  Abran Richard ;  Started 15-Aug-2008 Active Comments: DX: 733.90  ? clobetasol ointment (TEMOVATE) 0.05 % Apply to affected area every night for 4 weeks, then every other day for 4 weeks and then twice a week for 4 weeks or until resolution.  ? fluticasone (FLONASE) 50 MCG/ACT nasal spray Place 1 spray into both nostrils daily as needed.  ? LORazepam (ATIVAN) 0.5 MG tablet Take 1 tablet (0.5 mg total)by mouth 2 (two) times daily as needed for anxiety.  ? losartan-hydrochlorothiazide (HYZAAR) 100-25 MG tablet Take 1 tablet by mouth daily.  ? potassium  chloride SA (KLOR-CON M) 20 MEQ tablet TAKE ONE (1) TABLET BY MOUTH TWO (2) TIMES DAILY  ? [DISCONTINUED] carvedilol (COREG) 3.125 MG tablet Take 1 tablet (3.125 mg total) by mouth 2 (two) times daily with a meal.  ? ?No facility-administered medications prior to visit.  ? ? ?Review of Systems  ?Constitutional:  Positive for activity change and fatigue.  ?Eyes:  Negative for visual disturbance.  ?Respiratory:  Positive for cough and shortness of breath. Negative for wheezing.   ?Cardiovascular:  Positive for chest pain and leg swelling.  ?Neurological:  Negative for headaches.  ? ?Last CBC ?Lab Results  ?Component Value Date  ? WBC 4.4 01/07/2019  ? HGB 14.0 01/07/2019  ? HCT 41.1 01/07/2019  ? MCV 92 01/07/2019  ? MCH 31.3 01/07/2019  ? RDW 12.5 01/07/2019  ? PLT 249 01/07/2019  ? ?Last metabolic panel ?Lab Results  ?Component Value Date  ? GLUCOSE 94 02/06/2021  ? NA 138 02/06/2021  ? K 3.7 02/06/2021  ? CL 101 02/06/2021  ? CO2 25 02/06/2021  ? BUN 14 02/06/2021  ? CREATININE 0.93 02/06/2021  ? EGFR 59 (L) 02/06/2021  ? CALCIUM 9.6 02/06/2021  ? PROT 6.7 02/06/2021  ? ALBUMIN 4.0 02/06/2021  ? LABGLOB 2.7 02/06/2021  ? AGRATIO 1.5 02/06/2021  ? BILITOT 0.6 02/06/2021  ? ALKPHOS 87 02/06/2021  ? AST 20 02/06/2021  ? ALT 10 02/06/2021  ? ANIONGAP 5 11/25/2015  ? ?Last lipids ?Lab Results  ?Component Value Date  ?  CHOL 170 02/06/2021  ? HDL 55 02/06/2021  ? LDLCALC 107 (H) 02/06/2021  ? TRIG 40 02/06/2021  ? CHOLHDL 3.1 01/28/2020  ? ?Last thyroid functions ?Lab Results  ?Component Value Date  ? TSH 1.580 11/24/2017  ? ?  ?  Objective  ?  ?BP (!) 160/72 (BP Location: Left Arm, Patient Position: Sitting, Cuff Size: Normal)   Pulse (!) 57   Temp 98.3 ?F (36.8 ?C) (Temporal)   Resp 16   Wt 103 lb (46.7 kg)   SpO2 97%   BMI 17.68 kg/m?  ?BP Readings from Last 3 Encounters:  ?06/04/21 (!) 160/72  ?04/27/21 100/70  ?04/19/21 (!) 151/69  ? ?Wt Readings from Last 3 Encounters:  ?06/04/21 103 lb (46.7 kg)  ?04/27/21  105 lb 3.2 oz (47.7 kg)  ?04/19/21 103 lb (46.7 kg)  ? ?  ? ?Physical Exam ?Vitals reviewed.  ?Constitutional:   ?   General: She is not in acute distress. ?   Appearance: Normal appearance. She is well-developed. She is not diaphoretic.  ?HENT:  ?   Head: Normocephalic and atraumatic.  ?Eyes:  ?   General: No scleral icterus. ?   Conjunctiva/sclera: Conjunctivae normal.  ?Neck:  ?   Thyroid: No thyromegaly.  ?Cardiovascular:  ?   Rate and Rhythm: Normal rate and regular rhythm.  ?   Pulses: Normal pulses.  ?   Heart sounds: Normal heart sounds. No murmur heard. ?Pulmonary:  ?   Effort: Pulmonary effort is normal. No respiratory distress.  ?   Breath sounds: Normal breath sounds. No wheezing, rhonchi or rales.  ?Musculoskeletal:  ?   Cervical back: Neck supple.  ?   Right lower leg: No edema.  ?   Left lower leg: No edema.  ?Lymphadenopathy:  ?   Cervical: No cervical adenopathy.  ?Skin: ?   General: Skin is warm and dry.  ?   Findings: No rash.  ?Neurological:  ?   Mental Status: She is alert and oriented to person, place, and time. Mental status is at baseline.  ?Psychiatric:     ?   Mood and Affect: Mood normal.     ?   Behavior: Behavior normal.  ?  ? ? ?No results found for any visits on 06/04/21. ? Assessment & Plan  ?  ? ?Problem List Items Addressed This Visit   ? ?  ? Cardiovascular and Mediastinum  ? Benign hypertension - Primary  ?  Home BPs remain elevated ?I do wonder if by switching her from separate dosing of losartan and HCTZ if she has not actually gotten the combination pill and therefore we are under treating by not giving her 1 of these 2 medications-she will check when she gets home and let me know ?Continue to monitor home blood pressures ?She is not tolerating carvedilol well due to malaise, fatigue ?Did not tolerate amlodipine previously due to lower extremity edema ?Discontinue carvedilol ?May consider spironolactone, but would need to monitor her potassium closely and possibly discontinue  potassium supplement ?  ?  ?  ? Other  ? Adjustment disorder with anxious mood  ?  Chronic and uncontrolled ?Still having a lot of stress with the health of her family members and being a caregiver ?Did not tolerate Zoloft due to diarrhea ?Trial of low-dose Cymbalta 20 mg daily ?Discussed possible side effects ?  ?  ? Dyspnea on exertion  ?  New problem with dyspnea with dressing now ?She has upcoming follow-up with her cardiologist  and she will discuss it at that visit ?Exam is benign today without any hypervolemia ?  ?  ?  ? ?Return in about 4 weeks (around 07/02/2021) for chronic disease f/u, as scheduled.  ?   ? ?I, Lavon Paganini, MD, have reviewed all documentation for this visit. The documentation on 06/04/21 for the exam, diagnosis, procedures, and orders are all accurate and complete. ? ? ?Virginia Crews, MD, MPH ?[place] ?[facility]   ?

## 2021-06-04 NOTE — Assessment & Plan Note (Signed)
Chronic and uncontrolled ?Still having a lot of stress with the health of her family members and being a caregiver ?Did not tolerate Zoloft due to diarrhea ?Trial of low-dose Cymbalta 20 mg daily ?Discussed possible side effects ?

## 2021-06-04 NOTE — Telephone Encounter (Signed)
Pt states Dr B wanted her to call in information about her prescriptions. ? ?Hydrochlorothiazide 25 mg ?One tablet daily  ? ?potassium chloride SA (KLOR-CON M) 20 MEQ tablet ?1 tablet 2 X a day ?

## 2021-06-04 NOTE — Assessment & Plan Note (Signed)
New problem with dyspnea with dressing now ?She has upcoming follow-up with her cardiologist and she will discuss it at that visit ?Exam is benign today without any hypervolemia ?

## 2021-06-04 NOTE — Patient Instructions (Signed)
Add Cymbalta ?Stop carvedilol (coreg) ?Make sure you have the right pill of losartan-hctz ?

## 2021-06-04 NOTE — Assessment & Plan Note (Signed)
Home BPs remain elevated ?I do wonder if by switching her from separate dosing of losartan and HCTZ if she has not actually gotten the combination pill and therefore we are under treating by not giving her 1 of these 2 medications-she will check when she gets home and let me know ?Continue to monitor home blood pressures ?She is not tolerating carvedilol well due to malaise, fatigue ?Did not tolerate amlodipine previously due to lower extremity edema ?Discontinue carvedilol ?May consider spironolactone, but would need to monitor her potassium closely and possibly discontinue potassium supplement ?

## 2021-06-04 NOTE — Telephone Encounter (Signed)
Please refill the losartan-hctz from her med list and have her take this instead of the HCTZ.  This will bring her BP down.

## 2021-06-18 ENCOUNTER — Other Ambulatory Visit: Payer: Self-pay | Admitting: Family Medicine

## 2021-06-18 DIAGNOSIS — E876 Hypokalemia: Secondary | ICD-10-CM

## 2021-06-29 ENCOUNTER — Ambulatory Visit: Payer: Medicare PPO | Admitting: Obstetrics and Gynecology

## 2021-06-29 ENCOUNTER — Encounter: Payer: Self-pay | Admitting: Obstetrics and Gynecology

## 2021-06-29 VITALS — BP 108/60 | Ht 64.0 in | Wt 104.0 lb

## 2021-06-29 DIAGNOSIS — N904 Leukoplakia of vulva: Secondary | ICD-10-CM

## 2021-06-29 NOTE — Progress Notes (Signed)
? ?Patient ID: Susan Booth, female   DOB: [birth year], 88 y.o.   MRN: [id number] ? ?Reason for Consult: Follow-up (Feeling better) ?  ?Referred by Virginia Crews, MD ? ?Subjective:  ?   ?HPI: ? ?Susan Booth is a 88 y.o. female.  She presents today for follow-up of lichen sclerosus.  She has been applying clobetasol ointment every other day.  She reports that she has been feeling well. ? ?Gynecological History ? ?No LMP recorded. Patient has had a hysterectomy. ? ?Past Medical History:  ?Diagnosis Date  ? Anxiety   ? Benign essential tremor   ? Complication of anesthesia   ? nausea  ? Gastroesophageal reflux disease   ? Hypertension   ? Lichen sclerosus et atrophicus of the vulva   ? Murmur   ? Need for SBE (subacute bacterial endocarditis) prophylaxis   ? Osteoporosis, post-menopausal   ? Other chest pain   ? Paresthesia   ? Rhinitis, allergic   ? ?Family History  ?Problem Relation Age of Onset  ? COPD Mother   ? Early death Mother   ? Lung cancer Mother   ? Heart disease Father   ? Heart attack Father   ? Heart disease Sister   ?     heart valve problem  ? Dementia Sister   ? Breast cancer Maternal Aunt 47  ? Prostate cancer Son   ? Prostate cancer Son   ? ?Past Surgical History:  ?Procedure Laterality Date  ? ABDOMINAL HYSTERECTOMY  1968  ? ASD REPAIR, SECUNDUM  03/2007  ? CATARACT EXTRACTION Bilateral 06/2012  ? CORRECTION HAMMER TOE Bilateral 2003  ? hole in heart repaired    ? continue to have some leakage  ? ORIF PATELLA Left 11/24/2015  ? Procedure: OPEN REDUCTION INTERNAL (ORIF) FIXATION PATELLA;  Surgeon: Thornton Park, MD;  Location: [facility] ORS;  Service: Orthopedics;  Laterality: Left;  (PER Dr Tanja Port Spinal would be ideal   ? ? ?Short Social History:  ?Social History  ? ?Tobacco Use  ? Smoking status: Never  ? Smokeless tobacco: Never  ?Substance Use Topics  ? Alcohol use: Yes  ?  Comment: rare - 1 glass of wine  ? ? ?No Known Allergies ? ?Current Outpatient Medications  ?Medication  Sig Dispense Refill  ? aspirin EC 81 MG tablet Take 81 mg by mouth daily.    ? Calcium Carbonate-Vitamin D 600-400 MG-UNIT per tablet CALTRATE 600+D, 600-400MG -UNIT (Oral Tablet)  1 Every Day for 0 days  Quantity: 0.00;  Refills: 0   Ordered :12-Jan-2010  Abran Richard ;  Started 15-Aug-2008 Active Comments: DX: 733.90    ? clobetasol ointment (TEMOVATE) 0.05 % Apply to affected area every night for 4 weeks, then every other day for 4 weeks and then twice a week for 4 weeks or until resolution. 30 g 5  ? DULoxetine (CYMBALTA) 20 MG capsule Take 1 capsule (20 mg total) by mouth daily. 30 capsule 3  ? fluticasone (FLONASE) 50 MCG/ACT nasal spray Place 1 spray into both nostrils daily as needed. 48 g 3  ? LORazepam (ATIVAN) 0.5 MG tablet Take 1 tablet (0.5 mg total)by mouth 2 (two) times daily as needed for anxiety. 60 tablet 2  ? losartan-hydrochlorothiazide (HYZAAR) 100-25 MG tablet Take 1 tablet by mouth daily. 90 tablet 1  ? potassium chloride SA (KLOR-CON M) 20 MEQ tablet TAKE ONE (1) TABLET BY MOUTH TWO (2) TIMES DAILY 180 tablet 0  ? ?No current facility-administered medications for this  visit.  ? ? ?Review of Systems  ?Constitutional: Negative for chills, fatigue, fever and unexpected weight change.  ?HENT: Negative for trouble swallowing.  ?Eyes: Negative for loss of vision.  ?Respiratory: Negative for cough, shortness of breath and wheezing.  ?Cardiovascular: Negative for chest pain, leg swelling, palpitations and syncope.  ?GI: Negative for abdominal pain, blood in stool, diarrhea, nausea and vomiting.  ?GU: Negative for difficulty urinating, dysuria, frequency and hematuria.  ?Musculoskeletal: Negative for back pain, leg pain and joint pain.  ?Skin: Negative for rash.  ?Neurological: Negative for dizziness, headaches, light-headedness, numbness and seizures.  ?Psychiatric: Negative for behavioral problem, confusion, depressed mood and sleep disturbance.   ? ?   ?Objective:  ?Objective  ? ?Vitals:  ? 06/29/21  1051  ?BP: 108/60  ?Weight: 104 lb (47.2 kg)  ?Height: 5\' 4"  (1.626 m)  ? ?Body mass index is 17.85 kg/m?. ? ?Physical Exam ?Vitals and nursing note reviewed. Exam conducted with a chaperone present.  ?Constitutional:   ?   Appearance: Normal appearance.  ?HENT:  ?   Head: Normocephalic and atraumatic.  ?Eyes:  ?   Extraocular Movements: Extraocular movements intact.  ?   Pupils: Pupils are equal, round, and reactive to light.  ?Cardiovascular:  ?   Rate and Rhythm: Normal rate and regular rhythm.  ?Pulmonary:  ?   Effort: Pulmonary effort is normal.  ?   Breath sounds: Normal breath sounds.  ?Abdominal:  ?   General: Abdomen is flat.  ?   Palpations: Abdomen is soft.  ?Genitourinary: ?   Comments: Bilateral petechia on labia.  Small fissure at inferior perineum is well-healed.  Visual improvement overall of thevulva ?Musculoskeletal:  ?   Cervical back: Normal range of motion.  ?Skin: ?   General: Skin is warm and dry.  ?Neurological:  ?   General: No focal deficit present.  ?   Mental Status: She is alert and oriented to person, place, and time.  ?Psychiatric:     ?   Behavior: Behavior normal.     ?   Thought Content: Thought content normal.     ?   Judgment: Judgment normal.  ? ? ?Assessment/Plan:  ?  ? ?88 year old with lichen sclerosus and atrophy this ?Continue clobetasol ointment application.  Recommended decreasing ointment to twice a week.  Encouraged twice week application long-term for maintenance of lichen sclerosis symptoms. ? ?Follow-up in 3 to 6 months for monitoring. ? ?More than 5 minutes were spent face to face with the patient in the room, reviewing the medical record, labs and images, and coordinating care for the patient. The plan of management was discussed in detail and counseling was provided.  ? ? ?  ? ?Adrian Prows MD ?[facility], [facility] ?06/29/2021 ?10:53 AM ? ? ?

## 2021-06-29 NOTE — Patient Instructions (Signed)
Continue with Clobetasol ointment application twice a week  ?Follow up with Dr. Jolayne Panther in 3-6 months ?

## 2021-07-05 NOTE — Progress Notes (Signed)
?  ? ?I, Shawna Orleans, acting as a Education administrator for Lavon Paganini, MD.,have documented all relevant documentation on the behalf of Lavon Paganini, MD,as directed by  Lavon Paganini, MD while in the presence of Lavon Paganini, MD. ? ? ?Established patient visit ? ? ?Patient: Susan Booth   DOB: May 17, 1930   88 y.o. Female  MRN: [id number] ?Visit Date: 07/06/2021 ? ?Today's healthcare provider: Lavon Paganini, MD  ? ?Chief Complaint  ?Patient presents with  ? Hypertension  ? ?Subjective  ?  ?HPI  ?Hypertension, follow-up ? ?BP Readings from Last 3 Encounters:  ?07/06/21 137/70  ?06/29/21 108/60  ?06/04/21 (!) 160/72  ? Wt Readings from Last 3 Encounters:  ?07/06/21 104 lb (47.2 kg)  ?06/29/21 104 lb (47.2 kg)  ?06/04/21 103 lb (46.7 kg)  ?  ? ?She was last seen for hypertension 1 months ago.  ?BP at that visit was 160/72. Management since that visit includes patient to continue taking losartan-hctz.  ? ?She reports excellent compliance with treatment. ?She is not having side effects.  ?She is following a Low Sodium diet. ? ?Outside blood pressures are 130s-140s/80s. ?Symptoms: ?No chest pain No chest pressure  ?No palpitations No syncope  ?No dyspnea No orthopnea  ?No paroxysmal nocturnal dyspnea Yes lower extremity edema  ? ?Pertinent labs ?Lab Results  ?Component Value Date  ? CHOL 170 02/06/2021  ? HDL 55 02/06/2021  ? LDLCALC 107 (H) 02/06/2021  ? TRIG 40 02/06/2021  ? CHOLHDL 3.1 01/28/2020  ? Lab Results  ?Component Value Date  ? NA 138 02/06/2021  ? K 3.7 02/06/2021  ? CREATININE 0.93 02/06/2021  ? EGFR 59 (L) 02/06/2021  ? GLUCOSE 94 02/06/2021  ? TSH 1.580 11/24/2017  ?  ? ?The ASCVD Risk score (Arnett DK, et al., 2019) failed to calculate for the following reasons: ?  The 2019 ASCVD risk score is only valid for ages 57 to 48 ? ?--------------------------------------------------------------------------------------------------- ?Stress, anxiety, mood are better since starting cymbalta - she  is tolerating it well ? ?She has to do all of the driving now that her husband had a seizure.  This makes her nervous ? ?Medications: ?Outpatient Medications Prior to Visit  ?Medication Sig  ? aspirin EC 81 MG tablet Take 81 mg by mouth daily.  ? Calcium Carbonate-Vitamin D 600-400 MG-UNIT per tablet CALTRATE 600+D, 600-400MG -UNIT (Oral Tablet)  1 Every Day for 0 days  Quantity: 0.00;  Refills: 0   Ordered :12-Jan-2010  Abran Richard ;  Started 15-Aug-2008 Active Comments: DX: 733.90  ? clobetasol ointment (TEMOVATE) 0.05 % Apply to affected area every night for 4 weeks, then every other day for 4 weeks and then twice a week for 4 weeks or until resolution.  ? DULoxetine (CYMBALTA) 20 MG capsule Take 1 capsule (20 mg total) by mouth daily.  ? fluticasone (FLONASE) 50 MCG/ACT nasal spray Place 1 spray into both nostrils daily as needed.  ? LORazepam (ATIVAN) 0.5 MG tablet Take 1 tablet (0.5 mg total)by mouth 2 (two) times daily as needed for anxiety.  ? losartan-hydrochlorothiazide (HYZAAR) 100-25 MG tablet Take 1 tablet by mouth daily.  ? potassium chloride SA (KLOR-CON M) 20 MEQ tablet TAKE ONE (1) TABLET BY MOUTH TWO (2) TIMES DAILY  ? ?No facility-administered medications prior to visit.  ? ? ?Review of Systems  ?Constitutional:  Negative for appetite change and fatigue.  ?Respiratory:  Negative for chest tightness and shortness of breath.   ?Cardiovascular:  Positive for leg swelling. Negative for  chest pain and palpitations.  ? ?  ?  Objective  ?  ?BP 137/70 Comment: home reading  Pulse 66   Temp 98 ?F (36.7 ?C) (Temporal)   Resp 16   Wt 104 lb (47.2 kg)   BMI 17.85 kg/m?  ?BP Readings from Last 3 Encounters:  ?07/06/21 137/70  ?06/29/21 108/60  ?06/04/21 (!) 160/72  ? ?Wt Readings from Last 3 Encounters:  ?07/06/21 104 lb (47.2 kg)  ?06/29/21 104 lb (47.2 kg)  ?06/04/21 103 lb (46.7 kg)  ? ? ?Physical Exam ?Vitals reviewed.  ?Constitutional:   ?   General: She is not in acute distress. ?   Appearance:  Normal appearance. She is well-developed. She is not diaphoretic.  ?HENT:  ?   Head: Normocephalic and atraumatic.  ?Eyes:  ?   General: No scleral icterus. ?   Conjunctiva/sclera: Conjunctivae normal.  ?Neck:  ?   Thyroid: No thyromegaly.  ?Cardiovascular:  ?   Rate and Rhythm: Normal rate and regular rhythm.  ?   Heart sounds: Murmur heard.  ?Pulmonary:  ?   Effort: Pulmonary effort is normal. No respiratory distress.  ?   Breath sounds: Normal breath sounds. No wheezing, rhonchi or rales.  ?Musculoskeletal:  ?   Cervical back: Neck supple.  ?   Right lower leg: No edema.  ?   Left lower leg: No edema.  ?Lymphadenopathy:  ?   Cervical: No cervical adenopathy.  ?Skin: ?   General: Skin is warm and dry.  ?Neurological:  ?   Mental Status: She is alert and oriented to person, place, and time. Mental status is at baseline.  ?Psychiatric:     ?   Mood and Affect: Mood normal.     ?   Behavior: Behavior normal.  ?  ? ? ?No results found for any visits on 07/06/21. ? Assessment & Plan  ?  ? ?Problem List Items Addressed This Visit   ? ?  ? Cardiovascular and Mediastinum  ? Benign hypertension - Primary  ?  Well controlled ?Continue current medications ?Recheck metabolic panel ?F/u in 6 months  ?  ?  ? Relevant Orders  ? Basic Metabolic Panel (BMET)  ?  ? Other  ? Adjustment disorder with anxious mood  ?  Chronic and now well controlled ?Improved since starting Cymbalta - will continue $RemoveBefor'20mg'[id number]$  daily ?  ?  ?  ? ?Return in about 7 months (around 02/05/2022) for AWV, CPE.  ?   ? ?I, Lavon Paganini, MD, have reviewed all documentation for this visit. The documentation on 07/06/21 for the exam, diagnosis, procedures, and orders are all accurate and complete. ? ? ?Virginia Crews, MD, MPH ?[place] ?[facility]   ?

## 2021-07-06 ENCOUNTER — Encounter: Payer: Self-pay | Admitting: Family Medicine

## 2021-07-06 ENCOUNTER — Ambulatory Visit: Payer: Medicare PPO | Admitting: Family Medicine

## 2021-07-06 VITALS — BP 137/70 | HR 66 | Temp 98.0°F | Resp 16 | Wt 104.0 lb

## 2021-07-06 DIAGNOSIS — F4322 Adjustment disorder with anxiety: Secondary | ICD-10-CM | POA: Diagnosis not present

## 2021-07-06 DIAGNOSIS — I10 Essential (primary) hypertension: Secondary | ICD-10-CM

## 2021-07-06 NOTE — Assessment & Plan Note (Signed)
Well controlled Continue current medications Recheck metabolic panel F/u in 6 months  

## 2021-07-06 NOTE — Assessment & Plan Note (Signed)
Chronic and now well controlled ?Improved since starting Cymbalta - will continue 20mg  daily ?

## 2021-07-07 LAB — BASIC METABOLIC PANEL
BUN/Creatinine Ratio: 21 (ref 12–28)
BUN: 15 mg/dL (ref 8–27)
CO2: 24 mmol/L (ref 20–29)
Calcium: 9.8 mg/dL (ref 8.7–10.3)
Chloride: 98 mmol/L (ref 96–106)
Creatinine, Ser: 0.73 mg/dL (ref 0.57–1.00)
Glucose: 102 mg/dL — ABNORMAL HIGH (ref 70–99)
Potassium: 3.7 mmol/L (ref 3.5–5.2)
Sodium: 136 mmol/L (ref 134–144)
eGFR: 79 mL/min/{1.73_m2} (ref >59)

## 2021-07-10 DIAGNOSIS — I10 Essential (primary) hypertension: Secondary | ICD-10-CM | POA: Diagnosis not present

## 2021-07-10 DIAGNOSIS — I491 Atrial premature depolarization: Secondary | ICD-10-CM | POA: Diagnosis not present

## 2021-07-10 DIAGNOSIS — I270 Primary pulmonary hypertension: Secondary | ICD-10-CM | POA: Diagnosis not present

## 2021-07-10 DIAGNOSIS — I351 Nonrheumatic aortic (valve) insufficiency: Secondary | ICD-10-CM | POA: Diagnosis not present

## 2021-08-06 ENCOUNTER — Ambulatory Visit: Payer: Medicare PPO | Admitting: Family Medicine

## 2021-08-30 NOTE — Progress Notes (Signed)
GYNECOLOGY PROGRESS NOTE  Subjective:    Patient ID: Susan Booth, female    DOB: 05/25/1930, 88 y.o.   MRN: [id number]  HPI  Patient is a 88 y.o. G32P2002 female who presents for Lichen Sclerosis. She was previously seen at [facility] (however also has remote h/o being patient of [facility] 3-4 years ago). She is here today for continued vaginal itching and burning. She has not used her Clobetasol in several weeks as she reports that she did not feel it was helping and was confused about how often it was recommended for her to use. States she was confused about how to take it, and it did not seem to help when she did use it. She wants to discuss a new medication.    Past Medical History:  Diagnosis Date   Anxiety    Benign essential tremor    Complication of anesthesia    nausea   Gastroesophageal reflux disease    Hypertension    Lichen sclerosus et atrophicus of the vulva    Murmur    Need for SBE (subacute bacterial endocarditis) prophylaxis    Osteoporosis, post-menopausal    Other chest pain    Paresthesia    Rhinitis, allergic     Past Surgical History:  Procedure Laterality Date   ABDOMINAL HYSTERECTOMY  1968   ASD REPAIR, SECUNDUM  03/2007   CATARACT EXTRACTION Bilateral 06/2012   CORRECTION HAMMER TOE Bilateral 2003   hole in heart repaired     continue to have some leakage   ORIF PATELLA Left 11/24/2015   Procedure: OPEN REDUCTION INTERNAL (ORIF) FIXATION PATELLA;  Surgeon: Thornton Park, MD;  Location: [facility] ORS;  Service: Orthopedics;  Laterality: Left;  (PER Dr Tanja Port Spinal would be ideal     Social History   Socioeconomic History   Marital status: Married    Spouse name: Eulas Post   Number of children: 2   Years of education: Some College   Highest education level: Some college, no degree  Occupational History   Occupation: Retired  Tobacco Use   Smoking status: Never   Smokeless tobacco: Never  Vaping Use   Vaping Use: Never used   Substance and Sexual Activity   Alcohol use: Yes    Comment: rare - 1 glass of wine   Drug use: No   Sexual activity: Never  Other Topics Concern   Not on file  Social History Narrative   Not on file   Social Determinants of Health   Financial Resource Strain: Not on file  Food Insecurity: Not on file  Transportation Needs: Not on file  Physical Activity: Not on file  Stress: Not on file  Social Connections: Not on file  Intimate Partner Violence: Not on file    Current Outpatient Medications on File Prior to Visit  Medication Sig Dispense Refill   aspirin EC 81 MG tablet Take 81 mg by mouth daily.     Calcium Carbonate-Vitamin D 600-400 MG-UNIT per tablet CALTRATE 600+D, 600-400MG -UNIT (Oral Tablet)  1 Every Day for 0 days  Quantity: 0.00;  Refills: 0   Ordered :12-Jan-2010  Abran Richard ;  Started 15-Aug-2008 Active Comments: DX: 733.90     clobetasol ointment (TEMOVATE) 0.05 % Apply to affected area every night for 4 weeks, then every other day for 4 weeks and then twice a week for 4 weeks or until resolution. 30 g 5   DULoxetine (CYMBALTA) 20 MG capsule Take 1 capsule (20 mg total) by  mouth daily. 30 capsule 3   fluticasone (FLONASE) 50 MCG/ACT nasal spray Place 1 spray into both nostrils daily as needed. 48 g 3   LORazepam (ATIVAN) 0.5 MG tablet Take 1 tablet (0.5 mg total)by mouth 2 (two) times daily as needed for anxiety. 60 tablet 2   losartan-hydrochlorothiazide (HYZAAR) 100-25 MG tablet Take 1 tablet by mouth daily. 90 tablet 1   potassium chloride SA (KLOR-CON M) 20 MEQ tablet TAKE ONE (1) TABLET BY MOUTH TWO (2) TIMES DAILY 180 tablet 0   No current facility-administered medications on file prior to visit.    No Known Allergies   Review of Systems Pertinent items are noted in HPI.   Objective:   Blood pressure (!) 174/85, height 5\' 2"  (1.575 m), weight 102 lb (46.3 kg). Body mass index is 18.66 kg/m. General appearance: alert, cooperative, and no  distress Abdomen: soft, non-tender; bowel sounds normal; no masses,  no organomegaly Pelvic: external genitalia with several superficial hemangiomas on labia bilaterally.  No lichenification seen at this time, however moderate vaginal atrophy of the vaginal introitus noted. Rectovaginal septum normal.  Vagina without discharge.  Cervix normal appearing, no lesions and no motion tenderness.  Uterus mobile, nontender, normal shape and size.  Adnexae non-palpable, nontender bilaterally.   Extremities: extremities normal, atraumatic, no cyanosis or edema Neurologic: Grossly normal   Assessment:   1. Lichen sclerosus et atrophicus of the vulva   2. Vaginal atrophy      Plan:   1. Lichen sclerosus et atrophicus of the vulva - Exam today with no evidence of lichens sclerosis. Discussed that this was not the likely cause of her current symptoms. Has had visible signs on exam in the past. Currently no flare. Advised that at this time does not require treatment with the steroids.   2. Vaginal atrophy - Discussed management of atrophy, will give samples of Premarin cream. Advised to use daily for the next 2-3 weeks, then can decrease to 2-3 times weekly.   RTC in 1 month for follow up of symptoms.   Rubie Maid, MD [facility]

## 2021-09-04 ENCOUNTER — Encounter: Payer: Self-pay | Admitting: Obstetrics and Gynecology

## 2021-09-04 ENCOUNTER — Ambulatory Visit: Payer: Medicare PPO | Admitting: Obstetrics and Gynecology

## 2021-09-04 VITALS — BP 174/85 | Ht 62.0 in | Wt 102.0 lb

## 2021-09-04 DIAGNOSIS — N904 Leukoplakia of vulva: Secondary | ICD-10-CM | POA: Diagnosis not present

## 2021-09-04 DIAGNOSIS — N952 Postmenopausal atrophic vaginitis: Secondary | ICD-10-CM

## 2021-09-07 ENCOUNTER — Ambulatory Visit: Payer: Medicare PPO | Admitting: Family Medicine

## 2021-09-07 ENCOUNTER — Encounter: Payer: Self-pay | Admitting: Family Medicine

## 2021-09-07 VITALS — BP 160/66 | HR 83 | Temp 98.4°F | Resp 16 | Wt 101.1 lb

## 2021-09-07 DIAGNOSIS — J069 Acute upper respiratory infection, unspecified: Secondary | ICD-10-CM | POA: Diagnosis not present

## 2021-09-07 NOTE — Progress Notes (Signed)
I,Sulibeya S Dimas,acting as a Neurosurgeon for Shirlee Latch, MD.,have documented all relevant documentation on the behalf of Shirlee Latch, MD,as directed by  Shirlee Latch, MD while in the presence of Shirlee Latch, MD.   Established patient visit   Patient: Susan Booth   DOB: Apr 07, 1930   88 y.o. Female  MRN: [id number] Visit Date: 09/07/2021  Today's healthcare provider: Shirlee Latch, MD   Chief Complaint  Patient presents with   Cough   Subjective    Cough This is a new problem. The current episode started yesterday. The problem has been unchanged. The problem occurs constantly. The cough is Productive of sputum. Associated symptoms include myalgias. Pertinent negatives include no chest pain, chills, ear congestion, ear pain, fever, postnasal drip, rhinorrhea, sore throat, shortness of breath, sweats or wheezing. Nothing aggravates the symptoms. She has tried nothing for the symptoms.    couldn't sleep due to cough.  Mild production of sputum this morning. No SOB, chest pain.  No better with vicks rub. Didn't try any cough medicines.  No known sick contacts. Sister has been sick, but stayed away from her.  Has not done home COVID test.  Medications: Outpatient Medications Prior to Visit  Medication Sig   aspirin EC 81 MG tablet Take 81 mg by mouth daily.   Calcium Carbonate-Vitamin D 600-400 MG-UNIT per tablet CALTRATE 600+D, 600-400MG -UNIT (Oral Tablet)  1 Every Day for 0 days  Quantity: 0.00;  Refills: 0   Ordered :12-Jan-2010  [name] ;  Started 15-Aug-2008 Active Comments: DX: 733.90   clobetasol ointment (TEMOVATE) 0.05 % Apply to affected area every night for 4 weeks, then every other day for 4 weeks and then twice a week for 4 weeks or until resolution. (Patient not taking: Reported on 09/07/2021)   DULoxetine (CYMBALTA) 20 MG capsule Take 1 capsule (20 mg total) by mouth daily.   fluticasone (FLONASE) 50 MCG/ACT nasal spray Place 1 spray  into both nostrils daily as needed.   LORazepam (ATIVAN) 0.5 MG tablet Take 1 tablet (0.5 mg total)by mouth 2 (two) times daily as needed for anxiety.   losartan-hydrochlorothiazide (HYZAAR) 100-25 MG tablet Take 1 tablet by mouth daily.   potassium chloride SA (KLOR-CON M) 20 MEQ tablet TAKE ONE (1) TABLET BY MOUTH TWO (2) TIMES DAILY   No facility-administered medications prior to visit.    Review of Systems  Constitutional:  Positive for fatigue. Negative for chills, diaphoresis and fever.  HENT:  Negative for congestion, ear pain, postnasal drip, rhinorrhea, sinus pressure, sinus pain and sore throat.   Respiratory:  Positive for cough. Negative for shortness of breath and wheezing.   Cardiovascular:  Negative for chest pain.  Musculoskeletal:  Positive for myalgias.        Objective    BP (!) 160/66 (BP Location: Left Arm, Patient Position: Sitting, Cuff Size: Normal)   Pulse 83   Temp 98.4 F (36.9 C) (Oral)   Resp 16   Wt 101 lb 1.6 oz (45.9 kg)   SpO2 97%   BMI 18.49 kg/m  BP Readings from Last 3 Encounters:  09/07/21 (!) 160/66  09/04/21 (!) 174/85  07/06/21 137/70   Wt Readings from Last 3 Encounters:  09/07/21 101 lb 1.6 oz (45.9 kg)  09/04/21 102 lb (46.3 kg)  07/06/21 104 lb (47.2 kg)      Physical Exam Vitals reviewed.  Constitutional:      General: She is not in acute distress.    Appearance:  Normal appearance. She is well-developed. She is not diaphoretic.  HENT:     Head: Normocephalic and atraumatic.     Nose: Nose normal.     Mouth/Throat:     Mouth: Mucous membranes are moist.     Pharynx: Oropharynx is clear. No oropharyngeal exudate.  Eyes:     General: No scleral icterus.    Conjunctiva/sclera: Conjunctivae normal.     Pupils: Pupils are equal, round, and reactive to light.  Neck:     Thyroid: No thyromegaly.  Cardiovascular:     Rate and Rhythm: Normal rate and regular rhythm.     Heart sounds: Normal heart sounds. No murmur  heard. Pulmonary:     Effort: Pulmonary effort is normal. No respiratory distress.     Breath sounds: Normal breath sounds. No wheezing, rhonchi or rales.  Musculoskeletal:     Cervical back: Neck supple.     Right lower leg: No edema.     Left lower leg: No edema.  Lymphadenopathy:     Cervical: No cervical adenopathy.  Skin:    General: Skin is warm and dry.  Neurological:     Mental Status: She is alert and oriented to person, place, and time. Mental status is at baseline.  Psychiatric:        Mood and Affect: Mood normal.        Behavior: Behavior normal.       No results found for any visits on 09/07/21.  Assessment & Plan     1. Viral URI with cough - symptoms and exam c/w viral URI - no evidence of strep pharyngitis, CAP, AOM, bacterial sinusitis, or other bacterial infection - COVID test today - will treat with antiviral if positive - discussed symptomatic management, natural course, and return precautions  - Novel Coronavirus, NAA ([facility])    Return if symptoms worsen or fail to improve.      I, Shirlee Latch, MD, have reviewed all documentation for this visit. The documentation on 09/07/21 for the exam, diagnosis, procedures, and orders are all accurate and complete.   Marri Mcneff, Marzella Schlein, MD, MPH [facility]

## 2021-09-09 LAB — NOVEL CORONAVIRUS, NAA: SARS-CoV-2, NAA: NOT DETECTED

## 2021-09-18 ENCOUNTER — Telehealth: Payer: Self-pay | Admitting: Obstetrics

## 2021-09-18 NOTE — Telephone Encounter (Signed)
Pt called back and is scheduled with Dr. Valentino Saxon at [facility].  Appt not needed.

## 2021-09-18 NOTE — Telephone Encounter (Signed)
Called pt to schedule pessary maintenance which is due.  Left message for pt to call back to schedule.

## 2021-09-21 ENCOUNTER — Other Ambulatory Visit: Payer: Self-pay | Admitting: Family Medicine

## 2021-09-21 DIAGNOSIS — E876 Hypokalemia: Secondary | ICD-10-CM

## 2021-10-03 NOTE — Progress Notes (Unsigned)
    GYNECOLOGY PROGRESS NOTE  Subjective:    Patient ID: Susan Booth, female    DOB: January 25, 1931, 88 y.o.   MRN: [id number]  HPI  Patient is a 88 y.o. G59P2002 female who presents for 1 month follow up for vaginal atrophy and lichen sclerosis.  {Common ambulatory SmartLinks:19316}  Review of Systems {ros; complete:30496}   Objective:   There were no vitals taken for this visit. There is no height or weight on file to calculate BMI. General appearance: {general exam:16600} Abdomen: {abdominal exam:16834} Pelvic: {pelvic exam:16852::"cervix normal in appearance","external genitalia normal","no adnexal masses or tenderness","no cervical motion tenderness","rectovaginal septum normal","uterus normal size, shape, and consistency","vagina normal without discharge"} Extremities: {extremity exam:5109} Neurologic: {neuro exam:17854}   Assessment:   No diagnosis found.   Plan:   There are no diagnoses linked to this encounter.    Hildred Laser, MD [facility]

## 2021-10-04 ENCOUNTER — Encounter: Payer: Self-pay | Admitting: Obstetrics and Gynecology

## 2021-10-04 ENCOUNTER — Ambulatory Visit: Payer: Medicare PPO | Admitting: Obstetrics and Gynecology

## 2021-10-04 VITALS — BP 158/66 | HR 66 | Wt 100.0 lb

## 2021-10-04 DIAGNOSIS — N904 Leukoplakia of vulva: Secondary | ICD-10-CM

## 2021-10-04 DIAGNOSIS — N952 Postmenopausal atrophic vaginitis: Secondary | ICD-10-CM | POA: Diagnosis not present

## 2021-10-04 MED ORDER — PREMARIN 0.625 MG/GM VA CREA: TOPICAL_CREAM | VAGINAL | 6 refills | Status: DC

## 2021-11-27 DIAGNOSIS — H903 Sensorineural hearing loss, bilateral: Secondary | ICD-10-CM | POA: Diagnosis not present

## 2021-12-17 ENCOUNTER — Other Ambulatory Visit: Payer: Self-pay | Admitting: Family Medicine

## 2021-12-17 DIAGNOSIS — E876 Hypokalemia: Secondary | ICD-10-CM

## 2021-12-25 ENCOUNTER — Other Ambulatory Visit: Payer: Self-pay | Admitting: Family Medicine

## 2021-12-25 DIAGNOSIS — E876 Hypokalemia: Secondary | ICD-10-CM

## 2021-12-25 NOTE — Telephone Encounter (Signed)
Requested Prescriptions  Pending Prescriptions Disp Refills  . potassium chloride SA (KLOR-CON M) 20 MEQ tablet [Pharmacy Med Name: POTASSIUM CL ER 20 MEQ TABLET] 180 tablet 0    Sig: TAKE ONE (1) TABLET BY MOUTH TWO (2) TIMES DAILY     Endocrinology:  Minerals - Potassium Supplementation Passed - 12/25/2021  9:48 AM      Passed - K in normal range and within 360 days    Potassium  Date Value Ref Range Status  07/06/2021 3.7 3.5 - 5.2 mmol/L Final  02/21/2013 3.6 3.5 - 5.1 mmol/L Final         Passed - Cr in normal range and within 360 days    Creatinine  Date Value Ref Range Status  02/21/2013 0.77 0.60 - 1.30 mg/dL Final   Creatinine, Ser  Date Value Ref Range Status  07/06/2021 0.73 0.57 - 1.00 mg/dL Final         Passed - Valid encounter within last 12 months    Recent Outpatient Visits          3 months ago Viral URI with cough   [facility] [employer], Dionne Bucy, MD   5 months ago Benign hypertension   [facility] [place], Dionne Bucy, MD   6 months ago Benign hypertension   [facility] [place], Dionne Bucy, MD   8 months ago Primary pulmonary hypertension [facility])   [facility], Dionne Bucy, MD   9 months ago Irregular heart beat   [facility] [place], Dionne Bucy, MD

## 2022-01-01 ENCOUNTER — Ambulatory Visit: Payer: Self-pay | Admitting: *Deleted

## 2022-01-01 NOTE — Telephone Encounter (Signed)
  Chief Complaint: COVID exposure, COVID symptoms Symptoms: cough,fatigue, headache, fog- forgetfulness  Frequency: symptoms started today Pertinent Negatives: Patient denies SOB Disposition: [] ED /[] Urgent Care (no appt availability in office) / [x] Appointment(In office/virtual)/ []  [facility]/ [] Home Care/ [] Refused Recommended Disposition /[] [facility]/ []  Follow-up with PCP Additional Notes: Patient does not have home test- call to office- appointment made- patient will call back if she gets worse- advised per treatment protocol, wear mask to appointment. Does not have support system local- family is out of town

## 2022-01-01 NOTE — Telephone Encounter (Signed)
Summary: Exposed to COVID-clinical advice   Patient was exposed to [place] by spouse on 10/1. Patient experiencing coughing, runny nose, fatigue. Patient has not taken a test. Declined appointment until she speaks with a nurse.      Reason for Disposition  [1] HIGH RISK patient (e.g., weak immune system, age > 91 years, obesity with BMI 30 or higher, pregnant, chronic lung disease or other chronic medical condition) AND [2] COVID symptoms (e.g., cough, fever)  (Exceptions: Already seen by PCP and no new or worsening symptoms.)  Answer Assessment - Initial Assessment Questions 1. COVID-19 EXPOSURE: "Please describe how you were exposed to someone with a COVID-19 infection."     Husband + COVID 2. PLACE of CONTACT: "Where were you when you were exposed to COVID-19?" (e.g., home, school, medical waiting room; which city?)     Home exposure 3. TYPE of CONTACT: "How much contact was there?" (e.g., sitting next to, live in same house, work in same office, same building)     Home exposure 4. DURATION of CONTACT: "How long were you in contact with the COVID-19 patient?" (e.g., a few seconds, passed by person, a few minutes, 15 minutes or longer, live with the patient)     Home exposure 5. MASK: "Were you wearing a mask?" "Was the other person wearing a mask?" Note: wearing a mask reduces the risk of an otherwise close contact.     Home exposure 6. DATE of CONTACT: "When did you have contact with a COVID-19 patient?" (e.g., how many days ago)     Husband diagnosed 12/30/21 7. COMMUNITY SPREAD: "Do you live in or have you traveled to an area where there are lots of COVID-19 cases (community spread)?" (See public health department website, if unsure)       yes 8. SYMPTOMS: "Do you have any symptoms?" (e.g., fever, cough, breathing difficulty, loss of taste or smell)     Cough, fatigue- feels terrible, headache 9. VACCINE: "Have you gotten the COVID-19 vaccine?" If Yes, ask: "Which one, how many shots, when  did you get it?"     COVID vaccine- not current  10. PREGNANCY OR POSTPARTUM: "Is there any chance you are pregnant?" "When was your last menstrual period?" "Did you deliver in the last 2 weeks?"         11. HIGH RISK: "Do you have any heart or lung problems?" (e.g., asthma, COPD, heart failure) "Do you have a weak immune system or other risk factors?" (e.g., HIV positive, chemotherapy, renal failure, diabetes mellitus, sickle cell anemia, obesity)       Yes- age, heart disease  Protocols used: Coronavirus (COVID-19) Exposure-A-AH, Coronavirus (COVID-19) Diagnosed or Suspected-A-AH

## 2022-01-02 ENCOUNTER — Encounter: Payer: Self-pay | Admitting: Family Medicine

## 2022-01-02 ENCOUNTER — Ambulatory Visit: Payer: Medicare PPO | Admitting: Family Medicine

## 2022-01-02 VITALS — BP 130/75 | HR 116 | Temp 98.2°F | Resp 16 | Ht 64.0 in | Wt 103.0 lb

## 2022-01-02 DIAGNOSIS — U071 COVID-19: Secondary | ICD-10-CM | POA: Diagnosis not present

## 2022-01-02 HISTORY — DX: COVID-19: U07.1

## 2022-01-02 LAB — POC COVID19 BINAXNOW: SARS Coronavirus 2 Ag: POSITIVE — AB

## 2022-01-02 MED ORDER — NIRMATRELVIR/RITONAVIR (PAXLOVID)TABLET: 3.0000 | ORAL_TABLET | Freq: Two times a day (BID) | ORAL | 0 refills | Status: AC

## 2022-01-02 NOTE — Addendum Note (Signed)
Addended by: Smitty Knudsen on: 01/02/2022 10:27 AM   Modules accepted: Orders

## 2022-01-02 NOTE — Patient Instructions (Signed)
Happy Birthday, we're sorry you're ill.  Your COVID test is positive; anti-viral medication called in.  Continue to eat and drink as you are able to prevent further dehydration.  Recommend supportive OTC medications and seek emergent care if necessary.  Take care, Gwyneth Sprout, [address] [place], [place] [address] [phone] (phone) [phone] (fax) [place]

## 2022-01-02 NOTE — Assessment & Plan Note (Signed)
Rapid COVID test positive; patient's spouse also has COVID Encouraged supportive medications and recommend push of any/all fluids given tenting skin on exam and new tachycardia Recommend antiviral medication; eGFR verified, given general malaise for 2 days. Continue to recommend additional 3 days of isolation given 2 days of s/s. Continue to drink water daily.  Goal of 64 oz/day minimum.  Can use OTC allergy medication, ex Zyrtec as well as nasal steroid, ex Flonase.  If you are congested you can use Saline Nasal Spray.  Use saline prior to using Flonase if needed.  You can use medications for cough like Delsym and for mucus, like Mucinex.  Can add lozenges or drink tea with local honey to help relieve symptoms.

## 2022-01-02 NOTE — Progress Notes (Signed)
Established patient visit   Patient: Susan Booth   DOB: Dec 02, 1930   88 y.o. Female  MRN: [id number] Visit Date: 01/02/2022  Today's healthcare provider: Gwyneth Sprout, FNP  Introduced to nurse practitioner role and practice setting.  All questions answered.  Discussed provider/patient relationship and expectations.   I,Tiffany J Bragg,acting as a scribe for Gwyneth Sprout, FNP.,have documented all relevant documentation on the behalf of Gwyneth Sprout, FNP,as directed by  Gwyneth Sprout, FNP while in the presence of Gwyneth Sprout, FNP.   Chief Complaint  Patient presents with   Covid Exposure    Patient complains of cough, congestion, no appetite for 2 days. Husband is covid positive.   Subjective    HPI HPI     Covid Exposure    Additional comments: Patient complains of cough, congestion, no appetite for 2 days. Husband is covid positive.      Last edited by Smitty Knudsen, CMA on 01/02/2022  8:12 AM.       Medications: Outpatient Medications Prior to Visit  Medication Sig   aspirin EC 81 MG tablet Take 81 mg by mouth daily.   Calcium Carbonate-Vitamin D 600-400 MG-UNIT per tablet CALTRATE 600+D, 600-400MG -UNIT (Oral Tablet)  1 Every Day for 0 days  Quantity: 0.00;  Refills: 0   Ordered :12-Jan-2010  Abran Richard ;  Started 15-Aug-2008 Active Comments: DX: 733.90   clobetasol ointment (TEMOVATE) 0.05 % Apply to affected area every night for 4 weeks, then every other day for 4 weeks and then twice a week for 4 weeks or until resolution.   conjugated estrogens (PREMARIN) vaginal cream Apply 1/2 gram to external and internal vagina 3 x weekly.   DULoxetine (CYMBALTA) 20 MG capsule Take 1 capsule (20 mg total) by mouth daily.   fluticasone (FLONASE) 50 MCG/ACT nasal spray Place 1 spray into both nostrils daily as needed.   LORazepam (ATIVAN) 0.5 MG tablet Take 1 tablet (0.5 mg total)by mouth 2 (two) times daily as needed for anxiety.   losartan-hydrochlorothiazide  (HYZAAR) 100-25 MG tablet Take 1 tablet by mouth daily.   potassium chloride SA (KLOR-CON M) 20 MEQ tablet TAKE ONE (1) TABLET BY MOUTH TWO (2) TIMES DAILY   No facility-administered medications prior to visit.    Review of Systems  Last metabolic panel Lab Results  Component Value Date   GLUCOSE 102 (H) 07/06/2021   NA 136 07/06/2021   K 3.7 07/06/2021   CL 98 07/06/2021   CO2 24 07/06/2021   BUN 15 07/06/2021   CREATININE 0.73 07/06/2021   EGFR 79 07/06/2021   CALCIUM 9.8 07/06/2021   PROT 6.7 02/06/2021   ALBUMIN 4.0 02/06/2021   LABGLOB 2.7 02/06/2021   AGRATIO 1.5 02/06/2021   BILITOT 0.6 02/06/2021   ALKPHOS 87 02/06/2021   AST 20 02/06/2021   ALT 10 02/06/2021   ANIONGAP 5 11/25/2015       Objective    BP 130/75 (BP Location: Left Arm, Patient Position: Sitting, Cuff Size: Normal)   Pulse (!) 116   Temp 98.2 F (36.8 C) (Oral)   Resp 16   Ht 5\' 4"  (1.626 m)   Wt 103 lb (46.7 kg)   SpO2 95%   BMI 17.68 kg/m  BP Readings from Last 3 Encounters:  01/02/22 130/75  10/04/21 (!) 158/66  09/07/21 (!) 160/66   Wt Readings from Last 3 Encounters:  01/02/22 103 lb (46.7 kg)  10/04/21 100 lb (45.4  kg)  09/07/21 101 lb 1.6 oz (45.9 kg)   SpO2 Readings from Last 3 Encounters:  01/02/22 95%  10/04/21 100%  09/07/21 97%      Physical Exam Vitals and nursing note reviewed.  Constitutional:      General: She is not in acute distress.    Appearance: Normal appearance. She is underweight. She is not ill-appearing, toxic-appearing or diaphoretic.  HENT:     Head: Normocephalic and atraumatic.     Right Ear: Tympanic membrane, ear canal and external ear normal.     Left Ear: Tympanic membrane, ear canal and external ear normal.     Nose: Nose normal. No congestion or rhinorrhea.     Mouth/Throat:     Mouth: Mucous membranes are moist.     Pharynx: Oropharynx is clear. No oropharyngeal exudate or posterior oropharyngeal erythema.  Eyes:      Conjunctiva/sclera: Conjunctivae normal.     Pupils: Pupils are equal, round, and reactive to light.  Cardiovascular:     Rate and Rhythm: Normal rate and regular rhythm.     Pulses: Normal pulses.     Heart sounds: Normal heart sounds. No murmur heard.    No friction rub. No gallop.  Pulmonary:     Effort: Pulmonary effort is normal. No respiratory distress.     Breath sounds: Normal breath sounds. No stridor. No wheezing, rhonchi or rales.     Comments: Reports cough; non productive Chest:     Chest wall: No tenderness.  Musculoskeletal:        General: No swelling, tenderness, deformity or signs of injury. Normal range of motion.     Cervical back: Neck supple. No tenderness.     Right lower leg: No edema.     Left lower leg: No edema.  Skin:    General: Skin is warm and dry.     Capillary Refill: Capillary refill takes less than 2 seconds.     Coloration: Skin is not jaundiced or pale.     Findings: No bruising, erythema, lesion or rash.     Comments: Tenting skin noted on back of hand; fluids encouraged based on exam and new tachycardia  Neurological:     General: No focal deficit present.     Mental Status: She is alert and oriented to person, place, and time. Mental status is at baseline.     Cranial Nerves: No cranial nerve deficit.     Sensory: No sensory deficit.     Motor: No weakness.     Coordination: Coordination normal.  Psychiatric:        Mood and Affect: Mood normal.        Behavior: Behavior normal.        Thought Content: Thought content normal.        Judgment: Judgment normal.       No results found for any visits on 01/02/22.  Assessment & Plan     Problem List Items Addressed This Visit       Other   COVID - Primary    Rapid COVID test positive; patient's spouse also has COVID Encouraged supportive medications and recommend push of any/all fluids given tenting skin on exam and new tachycardia Recommend antiviral medication; eGFR verified,  given general malaise for 2 days. Continue to recommend additional 3 days of isolation given 2 days of s/s. Continue to drink water daily.  Goal of 64 oz/day minimum.  Can use OTC allergy medication, ex Zyrtec as well as  nasal steroid, ex Flonase.  If you are congested you can use Saline Nasal Spray.  Use saline prior to using Flonase if needed.  You can use medications for cough like Delsym and for mucus, like Mucinex.  Can add lozenges or drink tea with local honey to help relieve symptoms.       Relevant Medications   nirmatrelvir/ritonavir EUA (PAXLOVID) 20 x 150 MG & 10 x $Re'100MG'agf$  TABS     Return if symptoms worsen or fail to improve.      Gwyneth Sprout, [address] [place], [place] [address] [phone] (phone) [phone] (fax) [place], [place] [phone] (phone) [phone] (fax)  [place]

## 2022-01-14 ENCOUNTER — Ambulatory Visit: Payer: Medicare PPO | Admitting: Family Medicine

## 2022-01-14 ENCOUNTER — Ambulatory Visit: Payer: Self-pay | Admitting: *Deleted

## 2022-01-14 NOTE — Telephone Encounter (Signed)
Noted  

## 2022-01-14 NOTE — Telephone Encounter (Signed)
  Chief Complaint: "Nervous" Symptoms: Husband calling for pt, initially stated pt "Incoherent." States pt positive covid for 1 week. Now with severe anxiety x 2-3 days, worsening. States not eating, sleeping or drinking much. Spoke with pt, A&O, states "Just so nervous." Pt's voice tremulous, tearful. "I don't know why so nervous." Husband on line, advised ED, declined. CAlled practice for consult, Sharyn Lull, also advised ED. Husband made aware. Will follow disposition. Frequency: 2-3 days Pertinent Negatives: Patient denies SOB, pain, dysuria Disposition: [x] ED /[] Urgent Care (no appt availability in office) / [] Appointment(In office/virtual)/ []  [facility]/ [] Home Care/ [] Refused Recommended Disposition /[] [facility]/ []  Follow-up with PCP Additional Notes: Advised ED, will follow disposition.  Reason for Disposition  Patient sounds very sick or weak to the triager  Answer Assessment - Initial Assessment Questions 1. LEVEL OF CONSCIOUSNESS: "How is he (she, the patient) acting right now?" (e.g., alert-oriented, confused, lethargic, stuporous, comatose)     "Knows where she is at." 2. ONSET: "When did the confusion start?"  (minutes, hours, days)     23 days ago 3. PATTERN "Does this come and go, or has it been constant since it started?"  "Is it present now?" Constant  4. ALCOHOL or DRUGS: "Has he been drinking alcohol or taking any drugs?"      No 5. NARCOTIC MEDICINES: "Has he been receiving any narcotic medications?" (e.g., morphine, Vicodin)     No 6. CAUSE: "What do you think is causing the confusion?"      Unsure 7. OTHER SYMPTOMS: "Are there any other symptoms?" (e.g., difficulty breathing, headache, fever, weakness)     No  Protocols used: Confusion - Delirium-A-AH

## 2022-01-29 DIAGNOSIS — I491 Atrial premature depolarization: Secondary | ICD-10-CM | POA: Diagnosis not present

## 2022-01-29 DIAGNOSIS — I361 Nonrheumatic tricuspid (valve) insufficiency: Secondary | ICD-10-CM | POA: Diagnosis not present

## 2022-01-29 DIAGNOSIS — I351 Nonrheumatic aortic (valve) insufficiency: Secondary | ICD-10-CM | POA: Diagnosis not present

## 2022-01-29 DIAGNOSIS — Z23 Encounter for immunization: Secondary | ICD-10-CM | POA: Diagnosis not present

## 2022-01-29 DIAGNOSIS — I10 Essential (primary) hypertension: Secondary | ICD-10-CM | POA: Diagnosis not present

## 2022-02-08 ENCOUNTER — Encounter: Payer: Self-pay | Admitting: Family Medicine

## 2022-02-08 ENCOUNTER — Ambulatory Visit (INDEPENDENT_AMBULATORY_CARE_PROVIDER_SITE_OTHER): Payer: Medicare PPO | Admitting: Family Medicine

## 2022-02-08 VITALS — BP 160/70 | HR 93 | Temp 98.0°F | Resp 16 | Ht 62.0 in | Wt 103.4 lb

## 2022-02-08 DIAGNOSIS — Z23 Encounter for immunization: Secondary | ICD-10-CM | POA: Diagnosis not present

## 2022-02-08 DIAGNOSIS — R413 Other amnesia: Secondary | ICD-10-CM | POA: Diagnosis not present

## 2022-02-08 MED ORDER — DONEPEZIL HCL 5 MG PO TABS: 5.0000 mg | ORAL_TABLET | Freq: Every day | ORAL | 0 refills | Status: DC

## 2022-02-08 NOTE — Progress Notes (Signed)
I,Sulibeya S Dimas,acting as a Education administrator for Lavon Paganini, MD.,have documented all relevant documentation on the behalf of Lavon Paganini, MD,as directed by  Lavon Paganini, MD while in the presence of Lavon Paganini, MD.    Established patient Visit     Patient: Susan Booth, Female    DOB: 1930-06-04, 88 y.o.   MRN: [id number] Visit Date: 02/08/2022  Today's Provider: Lavon Paganini, MD   Chief Complaint  Patient presents with   Medicare Wellness   Follow-up   Subjective    Susan Booth is a 88 y.o. female who was initially scheduled for AWV/CPE, but is not feeling well and prefers to reschedule.  HPI Patient C/O not feeling well since having COVID in October 2023. She reports decreased memory, poor appetite and feeling tired. Noticed a pretty steep decline since illness.  No pain, just doesn't feel well. Is most bothered by losing her memory.  Having more trouble with short term memory.  Not losing things. Says she can't remember what she needs to tell me about how she feels.  Decreased appetite. Difficulty finding words at times. Forgets where she puts things. Jumps from one thing to another without finishing anything. Very different from baseline    Medications: Outpatient Medications Prior to Visit  Medication Sig   aspirin EC 81 MG tablet Take 81 mg by mouth daily.   Calcium Carbonate-Vitamin D 600-400 MG-UNIT per tablet CALTRATE 600+D, 600-400MG-UNIT (Oral Tablet)  1 Every Day for 0 days  Quantity: 0.00;  Refills: 0   Ordered :12-Jan-2010  Abran Richard ;  Started 15-Aug-2008 Active Comments: DX: 733.90   fluticasone (FLONASE) 50 MCG/ACT nasal spray Place 1 spray into both nostrils daily as needed.   LORazepam (ATIVAN) 0.5 MG tablet Take 1 tablet (0.5 mg total)by mouth 2 (two) times daily as needed for anxiety.   losartan-hydrochlorothiazide (HYZAAR) 100-25 MG tablet Take 1 tablet by mouth daily.   potassium chloride SA (KLOR-CON M) 20 MEQ  tablet TAKE ONE (1) TABLET BY MOUTH TWO (2) TIMES DAILY   [DISCONTINUED] clobetasol ointment (TEMOVATE) 0.05 % Apply to affected area every night for 4 weeks, then every other day for 4 weeks and then twice a week for 4 weeks or until resolution.   [DISCONTINUED] conjugated estrogens (PREMARIN) vaginal cream Apply 1/2 gram to external and internal vagina 3 x weekly. (Patient not taking: Reported on 02/08/2022)   [DISCONTINUED] DULoxetine (CYMBALTA) 20 MG capsule Take 1 capsule (20 mg total) by mouth daily. (Patient not taking: Reported on 02/08/2022)   No facility-administered medications prior to visit.    No Known Allergies  Patient Care Team: Virginia Crews, MD as PCP - General (Family Medicine) Estill Cotta, MD as Consulting Physician (Ophthalmology) Ubaldo Glassing Javier Docker, MD as Consulting Physician (Cardiology) Rubie Maid, MD as Referring Physician (Obstetrics and Gynecology)  Review of Systems  Constitutional:  Positive for activity change, appetite change and fatigue.  Respiratory:  Positive for cough.   Endocrine: Positive for cold intolerance.  Psychiatric/Behavioral:  Positive for confusion and decreased concentration. The patient is nervous/anxious.   All other systems reviewed and are negative.   Last CBC Lab Results  Component Value Date   WBC 4.4 01/07/2019   HGB 14.0 01/07/2019   HCT 41.1 01/07/2019   MCV 92 01/07/2019   MCH 31.3 01/07/2019   RDW 12.5 01/07/2019   PLT 249 87/56/4332   Last metabolic panel Lab Results  Component Value Date   GLUCOSE 102 (H) 07/06/2021   NA  136 07/06/2021   K 3.7 07/06/2021   CL 98 07/06/2021   CO2 24 07/06/2021   BUN 15 07/06/2021   CREATININE 0.73 07/06/2021   EGFR 79 07/06/2021   CALCIUM 9.8 07/06/2021   PROT 6.7 02/06/2021   ALBUMIN 4.0 02/06/2021   LABGLOB 2.7 02/06/2021   AGRATIO 1.5 02/06/2021   BILITOT 0.6 02/06/2021   ALKPHOS 87 02/06/2021   AST 20 02/06/2021   ALT 10 02/06/2021   ANIONGAP 5  11/25/2015   Last lipids Lab Results  Component Value Date   CHOL 170 02/06/2021   HDL 55 02/06/2021   LDLCALC 107 (H) 02/06/2021   TRIG 40 02/06/2021   CHOLHDL 3.1 01/28/2020   Last hemoglobin A1c Lab Results  Component Value Date   HGBA1C 5.4 02/06/2021   Last thyroid functions Lab Results  Component Value Date   TSH 1.580 11/24/2017        Objective    Vitals: BP (!) 160/70 (BP Location: Left Arm, Patient Position: Sitting, Cuff Size: Normal)   Pulse 93   Temp 98 F (36.7 C) (Oral)   Resp 16   Ht _0  (1.575 m)   Wt 103 lb 6.4 oz (46.9 kg)   BMI 18.91 kg/m  BP Readings from Last 3 Encounters:  02/08/22 (!) 160/70  01/02/22 130/75  10/04/21 (!) 158/66   Wt Readings from Last 3 Encounters:  02/08/22 103 lb 6.4 oz (46.9 kg)  01/02/22 103 lb (46.7 kg)  10/04/21 100 lb (45.4 kg)       Physical Exam Vitals reviewed.  Constitutional:      General: She is not in acute distress.    Appearance: Normal appearance. She is well-developed. She is not diaphoretic.  HENT:     Head: Normocephalic and atraumatic.  Eyes:     General: No scleral icterus.    Conjunctiva/sclera: Conjunctivae normal.  Neck:     Thyroid: No thyromegaly.  Cardiovascular:     Rate and Rhythm: Normal rate and regular rhythm.     Heart sounds: Normal heart sounds. No murmur heard. Pulmonary:     Effort: Pulmonary effort is normal. No respiratory distress.     Breath sounds: Normal breath sounds. No wheezing, rhonchi or rales.  Musculoskeletal:     Cervical back: Neck supple.     Right lower leg: No edema.     Left lower leg: No edema.  Lymphadenopathy:     Cervical: No cervical adenopathy.  Skin:    General: Skin is warm and dry.     Findings: No rash.  Neurological:     Mental Status: She is alert and oriented to person, place, and time.     Cranial Nerves: No cranial nerve deficit.     Sensory: No sensory deficit.     Motor: No weakness.     Coordination: Coordination normal.      Gait: Gait normal.  Psychiatric:        Mood and Affect: Mood normal.        Behavior: Behavior normal.      Most recent functional status assessment:    02/08/2022   11:12 AM  In your present state of health, do you have any difficulty performing the following activities:  Hearing? 1  Vision? 0  Difficulty concentrating or making decisions? 0  Walking or climbing stairs? 0  Dressing or bathing? 0  Doing errands, shopping? 0   Most recent fall risk assessment:    02/08/2022   11:10 AM  Fall Risk   Number falls in past yr: 0  Injury with Fall? 0  Risk for fall due to : No Fall Risks  Follow up Falls evaluation completed    Most recent depression screenings:    02/08/2022   11:10 AM 01/02/2022    8:15 AM  PHQ 2/9 Scores  PHQ - 2 Score 4 0  PHQ- 9 Score 5 1   Most recent cognitive screening:    02/05/2021   11:30 AM  6CIT Screen  What Year? 0 points  What month? 0 points  What time? 0 points  Count back from 20 0 points  Months in reverse 0 points  Repeat phrase 4 points  Total Score 4 points     Assessment & Plan      Problem List Items Addressed This Visit       Other   Memory change - Primary    New problem that started abruptly after having COVID in October 2023 She is noticed a steep decline in her memory and her husband notes this to During her exam today she has difficulty with 3 word recall and recalls 2 of 3 words She is able to accurately spell world backwards and say the months of the year backwards and is oriented to person place and time She does struggle with repeating no ifs, ands or buts Her clock drawing is accurate She does not have any underlying known dementia, but is newly having the symptoms quite abruptly Given the abrupt nature, I do think it is appropriate to get an MRI to assess whether there is anything else happening We discussed also starting Aricept to see if we can minimize worsening Referral to neurology for further  evaluation and management I do wonder if her recent acute illness has caused some brain fog and memory difficulties that may resolve on their own with time, but I think it is reasonable to work this up Of note, her sister fairly significant dementia behavioral concerns      Relevant Orders   MR Brain Wo Contrast   Ambulatory referral to Neurology   Other Visit Diagnoses     Need for influenza vaccination       Relevant Orders   Flu Vaccine QUAD High Dose(Fluad) (Completed)        Return in about 3 months (around 05/11/2022) for CPE, AWV.     I, Lavon Paganini, MD, have reviewed all documentation for this visit. The documentation on 02/08/22 for the exam, diagnosis, procedures, and orders are all accurate and complete.   Symphanie Cederberg, Dionne Bucy, MD, MPH [facility]

## 2022-02-08 NOTE — Assessment & Plan Note (Signed)
New problem that started abruptly after having COVID in October 2023 She is noticed a steep decline in her memory and her husband notes this to During her exam today she has difficulty with 3 word recall and recalls 2 of 3 words She is able to accurately spell world backwards and say the months of the year backwards and is oriented to person place and time She does struggle with repeating no ifs, ands or buts Her clock drawing is accurate She does not have any underlying known dementia, but is newly having the symptoms quite abruptly Given the abrupt nature, I do think it is appropriate to get an MRI to assess whether there is anything else happening We discussed also starting Aricept to see if we can minimize worsening Referral to neurology for further evaluation and management I do wonder if her recent acute illness has caused some brain fog and memory difficulties that may resolve on their own with time, but I think it is reasonable to work this up Of note, her sister fairly significant dementia behavioral concerns

## 2022-02-12 ENCOUNTER — Ambulatory Visit: Payer: Self-pay | Admitting: *Deleted

## 2022-02-12 NOTE — Telephone Encounter (Signed)
  Chief Complaint: Trembling all over and having cramps in both of her legs since starting on Donepezil (Aricept) 5 mg   Call Mr. Janee Morn on [phone]. Symptoms: cramping in both legs.   Trembling all over Frequency: Since starting the Aricept 02/08/2022 Pertinent Negatives: Patient denies other symptoms Disposition: [] ED /[] Urgent Care (no appt availability in office) / [] Appointment(In office/virtual)/ []  [facility]/ [] Home Care/ [] Refused Recommended Disposition /[] [facility]/ [x]  Follow-up with PCP Additional Notes: Husband called in with pt in background.   Dr. told to call and let her know if Lissie had any side effects after starting this medication.    I sent this information to [facility] for Dr. .

## 2022-02-12 NOTE — Telephone Encounter (Signed)
Please stop Aricept. Hydrate well and then if things are resolved we can consider a different memory medicine, but we'll hold off for now.

## 2022-02-12 NOTE — Telephone Encounter (Signed)
Mr. Fazzino advised.

## 2022-02-12 NOTE — Telephone Encounter (Signed)
Reason for Disposition  [1] MODERATE pain (e.g., interferes with normal activities, limping) AND [2] present > 3 days  Answer Assessment - Initial Assessment Questions 1. ONSET: "When did the pain start?"      Cramping in her legs and trembling.   She saw Dr. B. Last week.  It's a new medication that was started.   It's Donepecil.     Started yesterday afternoon.    This morning trembling all over.    Cramping in legs.   2. LOCATION: "Where is the pain located?"      Cramping in legs.   This has been going on since starting the new medication but it's worse this morning the trembling and cramping in legs. 3. PAIN: "How bad is the pain?"    (Scale 1-10; or mild, moderate, severe)   -  MILD (1-3): doesn't interfere with normal activities    -  MODERATE (4-7): interferes with normal activities (e.g., work or school) or awakens from sleep, limping    -  SEVERE (8-10): excruciating pain, unable to do any normal activities, unable to walk     Leg cramps 4. WORK OR EXERCISE: "Has there been any recent work or exercise that involved this part of the body?"      No 5. CAUSE: "What do you think is causing the leg pain?"     New medication 6. OTHER SYMPTOMS: "Do you have any other symptoms?" (e.g., chest pain, back pain, breathing difficulty, swelling, rash, fever, numbness, weakness)     Trembling all over her body.    Cramps just in her legs. 7. PREGNANCY: "Is there any chance you are pregnant?" "When was your last menstrual period?"     N/A due to age  Protocols used: Leg Pain-A-AH

## 2022-02-15 ENCOUNTER — Telehealth: Payer: Self-pay | Admitting: Family Medicine

## 2022-02-15 NOTE — Telephone Encounter (Signed)
Left message for patient to call back and schedule Medicare Annual Wellness Visit (AWV) in office.   If not able to come in office, please offer to do virtually or by telephone.  Left office number and my jabber #[phone].  Last AWV:02/05/2021   Please schedule at anytime with Nurse Health Advisor.  

## 2022-02-18 ENCOUNTER — Other Ambulatory Visit: Payer: Self-pay | Admitting: Family Medicine

## 2022-02-26 ENCOUNTER — Ambulatory Visit
Admission: RE | Admit: 2022-02-26 | Discharge: 2022-02-26 | Disposition: A | Discharge status: Home / Self Care | Payer: Medicare PPO | Source: Ambulatory Visit | Attending: Family Medicine | Admitting: Family Medicine

## 2022-02-26 DIAGNOSIS — R4182 Altered mental status, unspecified: Secondary | ICD-10-CM | POA: Diagnosis not present

## 2022-02-26 DIAGNOSIS — R413 Other amnesia: Secondary | ICD-10-CM | POA: Diagnosis not present

## 2022-03-01 ENCOUNTER — Ambulatory Visit: Payer: Self-pay | Admitting: *Deleted

## 2022-03-01 NOTE — Telephone Encounter (Signed)
Please advise 

## 2022-03-01 NOTE — Telephone Encounter (Signed)
Reason for Disposition . [1] Caller requesting NON-URGENT health information AND [2] PCP's office is the best resource  Answer Assessment - Initial Assessment Questions 1. REASON FOR CALL or QUESTION: "What is your reason for calling today?" or "How can I best help you?" or "What question do you have that I can help answer?"     Pt called in for her MRI result.   Hasn't been read by the provider yet.   No interpretation given.  Let her know someone would call her back with the results.   Pt. Agreeable.  Protocols used: Information Only Call - No Triage-A-AH

## 2022-03-01 NOTE — Telephone Encounter (Signed)
Very small old stroke, otherwise normal for age. Recommend she follow up with Dr. Leonard Schwartz

## 2022-03-01 NOTE — Telephone Encounter (Signed)
Spoke with patient.

## 2022-03-04 ENCOUNTER — Telehealth: Payer: Self-pay

## 2022-03-04 NOTE — Telephone Encounter (Signed)
I think that would be alright.  A good way to get into the practice earlier.

## 2022-03-04 NOTE — Telephone Encounter (Signed)
Left detailed voice message advising as below.

## 2022-03-04 NOTE — Telephone Encounter (Signed)
Copied from CRM [id number]. Topic: General - Other >> Mar 04, 2022  9:29 AM Lyman Speller wrote: Reason for CRM: Pt can not get an appt with Dr. Sherryll Burger until late January but she could get an appt with a PA sooner / Pt wants to check with Dr. B if she is ok with pt seeing PA at Dr. Margaretmary Eddy office / please advise

## 2022-03-05 ENCOUNTER — Ambulatory Visit (INDEPENDENT_AMBULATORY_CARE_PROVIDER_SITE_OTHER): Payer: Medicare PPO

## 2022-03-05 VITALS — Wt 103.0 lb

## 2022-03-05 DIAGNOSIS — Z0000 Encounter for general adult medical examination without abnormal findings: Secondary | ICD-10-CM | POA: Diagnosis not present

## 2022-03-05 NOTE — Progress Notes (Signed)
Virtual Visit via Telephone Note  I connected with  Susan Booth on 03/05/22 at 11:30 AM EST by telephone and verified that I am speaking with the correct person using two identifiers.  Location: Patient: home Provider: [facility] Persons participating in the virtual visit: [place]   I discussed the limitations, risks, security and privacy concerns of performing an evaluation and management service by telephone and the availability of in person appointments. The patient expressed understanding and agreed to proceed.  Interactive audio and video telecommunications were attempted between this nurse and patient, however failed, due to patient having technical difficulties OR patient did not have access to video capability.  We continued and completed visit with audio only.  Some vital signs may be absent or patient reported.   [name], LPN  Subjective:   Susan Booth is a 88 y.o. female who presents for Medicare Annual (Subsequent) preventive examination.  Review of Systems     Cardiac Risk Factors include: advanced age (>30men, >15 women);hypertension     Objective:    There were no vitals filed for this visit. There is no height or weight on file to calculate BMI.     03/05/2022   11:31 AM 01/12/2020    8:52 AM 01/07/2019   10:19 AM 11/03/2017    8:49 AM 11/18/2016    9:01 AM 08/16/2016    9:51 AM 11/24/2015    5:05 PM  Advanced Directives  Does Patient Have a Medical Advance Directive? No No No No No No No  Would patient like information on creating a medical advance directive? No - Patient declined No - Patient declined No - Patient declined No - Patient declined   No - patient declined information    Current Medications (verified) Outpatient Encounter Medications as of 03/05/2022  Medication Sig   aspirin EC 81 MG tablet Take 81 mg by mouth daily.   Calcium Carbonate-Vitamin D 600-400 MG-UNIT per tablet CALTRATE 600+D, 600-400MG -UNIT (Oral  Tablet)  1 Every Day for 0 days  Quantity: 0.00;  Refills: 0   Ordered :12-Jan-2010  Abran Richard ;  Started 15-Aug-2008 Active Comments: DX: 733.90   donepezil (ARICEPT) 5 MG tablet Take 1 tablet (5 mg total) by mouth at bedtime.   fluticasone (FLONASE) 50 MCG/ACT nasal spray Place 1 spray into both nostrils daily as needed.   LORazepam (ATIVAN) 0.5 MG tablet Take 1 tablet (0.5 mg total)by mouth 2 (two) times daily as needed for anxiety.   losartan-hydrochlorothiazide (HYZAAR) 100-25 MG tablet Take 1 tablet by mouth daily.   potassium chloride SA (KLOR-CON M) 20 MEQ tablet TAKE ONE (1) TABLET BY MOUTH TWO (2) TIMES DAILY   No facility-administered encounter medications on file as of 03/05/2022.    Allergies (verified) Patient has no known allergies.   History: Past Medical History:  Diagnosis Date   Anxiety    Benign essential tremor    Complication of anesthesia    nausea   COVID 01/02/2022   Gastroesophageal reflux disease    Hypertension    Lichen sclerosus et atrophicus of the vulva    Murmur    Need for SBE (subacute bacterial endocarditis) prophylaxis    Osteoporosis, post-menopausal    Other chest pain    Paresthesia    Rhinitis, allergic    Past Surgical History:  Procedure Laterality Date   ABDOMINAL HYSTERECTOMY  1968   ASD REPAIR, SECUNDUM  03/2007   CATARACT EXTRACTION Bilateral 06/2012   CORRECTION HAMMER TOE Bilateral  2003   hole in heart repaired     continue to have some leakage   ORIF PATELLA Left 11/24/2015   Procedure: OPEN REDUCTION INTERNAL (ORIF) FIXATION PATELLA;  Surgeon: Thornton Park, MD;  Location: [facility] ORS;  Service: Orthopedics;  Laterality: Left;  (PER Dr Tanja Port Spinal would be ideal    Family History  Problem Relation Age of Onset   COPD Mother    Early death Mother    Lung cancer Mother    Heart disease Father    Heart attack Father    Heart disease Sister        heart valve problem   Dementia Sister    Breast cancer Maternal  Aunt 70   Prostate cancer Son    Prostate cancer Son    Social History   Socioeconomic History   Marital status: Married    Spouse name: Eulas Post   Number of children: 2   Years of education: Some College   Highest education level: Some college, no degree  Occupational History   Occupation: Retired  Tobacco Use   Smoking status: Never   Smokeless tobacco: Never  Vaping Use   Vaping Use: Never used  Substance and Sexual Activity   Alcohol use: Yes    Comment: rare - 1 glass of wine   Drug use: No   Sexual activity: Never  Other Topics Concern   Not on file  Social History Narrative   Not on file   Social Determinants of Health   Financial Resource Strain: Low Risk  (03/05/2022)   Overall Financial Resource Strain (CARDIA)    Difficulty of Paying Living Expenses: Not hard at all  Food Insecurity: No Food Insecurity (03/05/2022)   Hunger Vital Sign    Worried About Running Out of Food in the Last Year: Never true    Ran Out of Food in the Last Year: Never true  Transportation Needs: No Transportation Needs (03/05/2022)   PRAPARE - Hydrologist (Medical): No    Lack of Transportation (Non-Medical): No  Physical Activity: Insufficiently Active (03/05/2022)   Exercise Vital Sign    Days of Exercise per Week: 2 days    Minutes of Exercise per Session: 20 min  Stress: No Stress Concern Present (03/05/2022)   [place]    Feeling of Stress : Not at all  Social Connections: Moderately Integrated (03/05/2022)   Social Connection and Isolation Panel [NHANES]    Frequency of Communication with Friends and Family: More than three times a week    Frequency of Social Gatherings with Friends and Family: Once a week    Attends Religious Services: More than 4 times per year    Active Member of [employer] or Organizations: No    Attends Music therapist: Never    Marital Status: Married     Tobacco Counseling Counseling given: Not Answered   Clinical Intake:  Pre-visit preparation completed: Yes  Pain : No/denies pain     Nutritional Risks: None Diabetes: Yes CBG done?: No Did pt. bring in CBG monitor from home?: No  How often do you need to have someone help you when you read instructions, pamphlets, or other written materials from your doctor or pharmacy?: 1 - Never  Diabetic?no  Interpreter Needed?: No  Information entered by :: Kirke Shaggy, LPN   Activities of Daily Living    03/05/2022   11:32 AM 02/08/2022   11:12 AM  In your present state of health, do you have any difficulty performing the following activities:  Hearing? 1 1  Vision? 0 0  Difficulty concentrating or making decisions? 0 0  Walking or climbing stairs? 0 0  Dressing or bathing? 0 0  Doing errands, shopping? 0 0  Preparing Food and eating ? N   Using the Toilet? N   In the past six months, have you accidently leaked urine? N   Do you have problems with loss of bowel control? N   Managing your Medications? N   Managing your Finances? N   Housekeeping or managing your Housekeeping? N     Patient Care Team: Virginia Crews, MD as PCP - General (Family Medicine) Estill Cotta, MD as Consulting Physician (Ophthalmology) Ubaldo Glassing Javier Docker, MD as Consulting Physician (Cardiology) Rubie Maid, MD as Referring Physician (Obstetrics and Gynecology)  Indicate any recent Medical Services you may have received from other than [facility] providers in the past year (date may be approximate).     Assessment:   This is a routine wellness examination for [place].  Hearing/Vision screen Hearing Screening - Comments:: Wears aid Vision Screening - Comments:: Wears glasses- [facility]  Dietary issues and exercise activities discussed: Current Exercise Habits: Home exercise routine, Type of exercise: walking, Time (Minutes): 20, Frequency (Times/Week): 2, Weekly Exercise  (Minutes/Week): 40, Intensity: Mild   Goals Addressed             This Visit's Progress    DIET - EAT MORE FRUITS AND VEGETABLES         Depression Screen    03/05/2022   11:29 AM 02/08/2022   11:10 AM 01/02/2022    8:15 AM 09/07/2021    9:55 AM 07/06/2021    9:42 AM 02/05/2021   11:28 AM 07/28/2020   10:04 AM  PHQ 2/9 Scores  PHQ - 2 Score 0 4 0 0 0 0 0  PHQ- 9 Score 0 5 1 2 2  2     Fall Risk    03/05/2022   11:32 AM 02/08/2022   11:10 AM 01/02/2022    8:15 AM 09/07/2021    9:55 AM 07/06/2021    9:43 AM  Fall Risk   Falls in the past year? 0  0 0 0  Number falls in past yr: 0 0 0 0 0  Injury with Fall? 0 0 0 0 0  Risk for fall due to : No Fall Risks No Fall Risks No Fall Risks No Fall Risks No Fall Risks  Follow up Falls prevention discussed;Falls evaluation completed Falls evaluation completed Falls evaluation completed Falls evaluation completed Falls evaluation completed    FALL RISK PREVENTION PERTAINING TO THE HOME:  Any stairs in or around the home? Yes  If so, are there any without handrails? No  Home free of loose throw rugs in walkways, pet beds, electrical cords, etc? Yes  Adequate lighting in your home to reduce risk of falls? Yes   ASSISTIVE DEVICES UTILIZED TO PREVENT FALLS:  Life alert? No  Use of a cane, walker or w/c? No  Grab bars in the bathroom? Yes  Shower chair or bench in shower? Yes  Elevated toilet seat or a handicapped toilet? Yes    Cognitive Function:        03/05/2022   11:34 AM 02/05/2021   11:30 AM 11/03/2017    8:52 AM  6CIT Screen  What Year? 0 points 0 points 0 points  What month? 0 points 0  points 0 points  What time? 0 points 0 points 0 points  Count back from 20 0 points 0 points 0 points  Months in reverse 0 points 0 points 0 points  Repeat phrase 0 points 4 points 2 points  Total Score 0 points 4 points 2 points    Immunizations Immunization History  Administered Date(s) Administered   Fluad Quad(high Dose 65+)  01/07/2019, 02/08/2022   Influenza, High Dose Seasonal PF 01/05/2014, 12/22/2019   Influenza,inj,quad, With Preservative 12/29/2017   Influenza-Unspecified 12/29/2016, 12/31/2017, 01/02/2021   PFIZER(Purple Top)SARS-COV-2 Vaccination 04/07/2019, 04/28/2019   Pfizer Covid-19 Vaccine Bivalent Booster 57yrs & up 01/06/2020, 08/15/2020, 01/02/2021   Pneumococcal Conjugate-13 09/06/2013   Pneumococcal Polysaccharide-23 02/08/2004   Td 07/12/2003   Zoster Recombinat (Shingrix) 02/24/2018, 05/15/2018   Zoster, Live 09/29/2010    TDAP status: Due, Education has been provided regarding the importance of this vaccine. Advised may receive this vaccine at local pharmacy or Health Dept. Aware to provide a copy of the vaccination record if obtained from local pharmacy or Health Dept. Verbalized acceptance and understanding.  Flu Vaccine status: Up to date  Pneumococcal vaccine status: Up to date  Covid-19 vaccine status: Completed vaccines  Qualifies for Shingles Vaccine? Yes   Zostavax completed Yes   Shingrix Completed?: Yes  Screening Tests Health Maintenance  Topic Date Due   DTaP/Tdap/Td (2 - Tdap) 07/11/2013   DEXA SCAN  09/28/2015   COVID-19 Vaccine (6 - 2023-24 season) 11/30/2021   Medicare Annual Wellness (AWV)  03/06/2023   Pneumonia Vaccine 67+ Years old  Completed   INFLUENZA VACCINE  Completed   Zoster Vaccines- Shingrix  Completed   HPV VACCINES  Aged Out    Health Maintenance  Health Maintenance Due  Topic Date Due   DTaP/Tdap/Td (2 - Tdap) 07/11/2013   DEXA SCAN  09/28/2015   COVID-19 Vaccine (6 - 2023-24 season) 11/30/2021    Colorectal cancer screening: No longer required.   Mammogram status: No longer required due to age.   Lung Cancer Screening: (Low Dose CT Chest recommended if Age 5-80 years, 30 pack-year currently smoking OR have quit w/in 15years.) does not qualify.   Additional Screening:  Hepatitis C Screening: does not qualify; Completed  no  Vision Screening: Recommended annual ophthalmology exams for early detection of glaucoma and other disorders of the eye. Is the patient up to date with their annual eye exam?  Yes  Who is the provider or what is the name of the office in which the patient attends annual eye exams? [facility] If pt is not established with a provider, would they like to be referred to a provider to establish care? No .   Dental Screening: Recommended annual dental exams for proper oral hygiene  Community Resource Referral / Chronic Care Management: CRR required this visit?  No   CCM required this visit?  No      Plan:     I have personally reviewed and noted the following in the patient's chart:   Medical and social history Use of alcohol, tobacco or illicit drugs  Current medications and supplements including opioid prescriptions. Patient is not currently taking opioid prescriptions. Functional ability and status Nutritional status Physical activity Advanced directives List of other physicians Hospitalizations, surgeries, and ER visits in previous 12 months Vitals Screenings to include cognitive, depression, and falls Referrals and appointments  In addition, I have reviewed and discussed with patient certain preventive protocols, quality metrics, and best practice recommendations. A written personalized care  plan for preventive services as well as general preventive health recommendations were provided to patient.     Hal Hope, LPN   72/08/2033   Nurse Notes: none

## 2022-03-05 NOTE — Patient Instructions (Signed)
Susan Booth , Thank you for taking time to come for your Medicare Wellness Visit. I appreciate your ongoing commitment to your health goals. Please review the following plan we discussed and let me know if I can assist you in the future.   Screening recommendations/referrals: Colonoscopy: aged out Mammogram: aged out Bone Density: aged out Recommended yearly ophthalmology/optometry visit for glaucoma screening and checkup Recommended yearly dental visit for hygiene and checkup  Vaccinations: Influenza vaccine: 02/08/22 Pneumococcal vaccine: 09/06/13 Tdap vaccine: 07/12/03, due if have injury Shingles vaccine: Zostavax 09/29/10   Shingrix 02/24/18, 05/15/18   Covid-19:04/07/19, 04/28/19, 01/06/20, 08/15/20, 01/02/21  Advanced directives: no  Conditions/risks identified: none  Next appointment: Follow up in one year for your annual wellness visit 03/10/23 @ 9:45 am by phone   Preventive Care 65 Years and Older, Female Preventive care refers to lifestyle choices and visits with your health care provider that can promote health and wellness. What does preventive care include? A yearly physical exam. This is also called an annual well check. Dental exams once or twice a year. Routine eye exams. Ask your health care provider how often you should have your eyes checked. Personal lifestyle choices, including: Daily care of your teeth and gums. Regular physical activity. Eating a healthy diet. Avoiding tobacco and drug use. Limiting alcohol use. Practicing safe sex. Taking low-dose aspirin every day. Taking vitamin and mineral supplements as recommended by your health care provider. What happens during an annual well check? The services and screenings done by your health care provider during your annual well check will depend on your age, overall health, lifestyle risk factors, and family history of disease. Counseling  Your health care provider may ask you questions about your: Alcohol  use. Tobacco use. Drug use. Emotional well-being. Home and relationship well-being. Sexual activity. Eating habits. History of falls. Memory and ability to understand (cognition). Work and work Astronomer. Reproductive health. Screening  You may have the following tests or measurements: Height, weight, and BMI. Blood pressure. Lipid and cholesterol levels. These may be checked every 5 years, or more frequently if you are over 13 years old. Skin check. Lung cancer screening. You may have this screening every year starting at age 69 if you have a 30-pack-year history of smoking and currently smoke or have quit within the past 15 years. Fecal occult blood test (FOBT) of the stool. You may have this test every year starting at age 17. Flexible sigmoidoscopy or colonoscopy. You may have a sigmoidoscopy every 5 years or a colonoscopy every 10 years starting at age 74. Hepatitis C blood test. Hepatitis B blood test. Sexually transmitted disease (STD) testing. Diabetes screening. This is done by checking your blood sugar (glucose) after you have not eaten for a while (fasting). You may have this done every 1-3 years. Bone density scan. This is done to screen for osteoporosis. You may have this done starting at age 7. Mammogram. This may be done every 1-2 years. Talk to your health care provider about how often you should have regular mammograms. Talk with your health care provider about your test results, treatment options, and if necessary, the need for more tests. Vaccines  Your health care provider may recommend certain vaccines, such as: Influenza vaccine. This is recommended every year. Tetanus, diphtheria, and acellular pertussis (Tdap, Td) vaccine. You may need a Td booster every 10 years. Zoster vaccine. You may need this after age 67. Pneumococcal 13-valent conjugate (PCV13) vaccine. One dose is recommended after age 50. Pneumococcal  polysaccharide (PPSV23) vaccine. One dose is  recommended after age 50. Talk to your health care provider about which screenings and vaccines you need and how often you need them. This information is not intended to replace advice given to you by your health care provider. Make sure you discuss any questions you have with your health care provider. Document Released: 04/14/2015 Document Revised: 12/06/2015 Document Reviewed: 01/17/2015 Elsevier Interactive Patient Education  2017 [place] Prevention in the Home Falls can cause injuries. They can happen to people of all ages. There are many things you can do to make your home safe and to help prevent falls. What can I do on the outside of my home? Regularly fix the edges of walkways and driveways and fix any cracks. Remove anything that might make you trip as you walk through a door, such as a raised step or threshold. Trim any bushes or trees on the path to your home. Use bright outdoor lighting. Clear any walking paths of anything that might make someone trip, such as rocks or tools. Regularly check to see if handrails are loose or broken. Make sure that both sides of any steps have handrails. Any raised decks and porches should have guardrails on the edges. Have any leaves, snow, or ice cleared regularly. Use sand or salt on walking paths during winter. Clean up any spills in your garage right away. This includes oil or grease spills. What can I do in the bathroom? Use night lights. Install grab bars by the toilet and in the tub and shower. Do not use towel bars as grab bars. Use non-skid mats or decals in the tub or shower. If you need to sit down in the shower, use a plastic, non-slip stool. Keep the floor dry. Clean up any water that spills on the floor as soon as it happens. Remove soap buildup in the tub or shower regularly. Attach bath mats securely with double-sided non-slip rug tape. Do not have throw rugs and other things on the floor that can make you  trip. What can I do in the bedroom? Use night lights. Make sure that you have a light by your bed that is easy to reach. Do not use any sheets or blankets that are too big for your bed. They should not hang down onto the floor. Have a firm chair that has side arms. You can use this for support while you get dressed. Do not have throw rugs and other things on the floor that can make you trip. What can I do in the kitchen? Clean up any spills right away. Avoid walking on wet floors. Keep items that you use a lot in easy-to-reach places. If you need to reach something above you, use a strong step stool that has a grab bar. Keep electrical cords out of the way. Do not use floor polish or wax that makes floors slippery. If you must use wax, use non-skid floor wax. Do not have throw rugs and other things on the floor that can make you trip. What can I do with my stairs? Do not leave any items on the stairs. Make sure that there are handrails on both sides of the stairs and use them. Fix handrails that are broken or loose. Make sure that handrails are as long as the stairways. Check any carpeting to make sure that it is firmly attached to the stairs. Fix any carpet that is loose or worn. Avoid having throw rugs at the top  or bottom of the stairs. If you do have throw rugs, attach them to the floor with carpet tape. Make sure that you have a light switch at the top of the stairs and the bottom of the stairs. If you do not have them, ask someone to add them for you. What else can I do to help prevent falls? Wear shoes that: Do not have high heels. Have rubber bottoms. Are comfortable and fit you well. Are closed at the toe. Do not wear sandals. If you use a stepladder: Make sure that it is fully opened. Do not climb a closed stepladder. Make sure that both sides of the stepladder are locked into place. Ask someone to hold it for you, if possible. Clearly mark and make sure that you can  see: Any grab bars or handrails. First and last steps. Where the edge of each step is. Use tools that help you move around (mobility aids) if they are needed. These include: Canes. Walkers. Scooters. Crutches. Turn on the lights when you go into a dark area. Replace any light bulbs as soon as they burn out. Set up your furniture so you have a clear path. Avoid moving your furniture around. If any of your floors are uneven, fix them. If there are any pets around you, be aware of where they are. Review your medicines with your doctor. Some medicines can make you feel dizzy. This can increase your chance of falling. Ask your doctor what other things that you can do to help prevent falls. This information is not intended to replace advice given to you by your health care provider. Make sure you discuss any questions you have with your health care provider. Document Released: 01/12/2009 Document Revised: 08/24/2015 Document Reviewed: 04/22/2014 Elsevier Interactive Patient Education  2017 [employer].

## 2022-03-07 NOTE — Progress Notes (Signed)
I,Joseline E Rosas,acting as a scribe for Shirlee Latch, MD.,have documented all relevant documentation on the behalf of Shirlee Latch, MD,as directed by  Shirlee Latch, MD while in the presence of Shirlee Latch, MD.   Established patient visit   Patient: Susan Booth   DOB: [birth year]   88 y.o. Female  MRN: [id number] Visit Date: 03/08/2022  Today's healthcare provider: Shirlee Latch, MD   Chief Complaint  Patient presents with   Follow-Up memory   Subjective    HPI  Follow up for memory change  The patient was last seen for this 1 months ago. Changes made at last visit include stop Aricept due to jitteriness and leg cramping. Hydrate well and then if things are resolved we can consider a different memory medicine.  MRI.  She reports excellent compliance with treatment.  Has waves of anxiety that seem to exacerbate confusion. Much more jittery. ----------------------------------------------------------------------------------------- MRI Brain 02/26/22: IMPRESSION: No acute or reversible finding. Age related volume loss without subjective lobar predominance. Mild chronic small-vessel ischemic change of the cerebral hemispheric white matter. 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  Medications: Outpatient Medications Prior to Visit  Medication Sig   aspirin EC 81 MG tablet Take 81 mg by mouth daily.   Calcium Carbonate-Vitamin D 600-400 MG-UNIT per tablet CALTRATE 600+D, 600-400MG -UNIT (Oral Tablet)  1 Every Day for 0 days  Quantity: 0.00;  Refills: 0   Ordered :12-Jan-2010  [name] ;  Started 15-Aug-2008 Active Comments: DX: 733.90   LORazepam (ATIVAN) 0.5 MG tablet Take 1 tablet (0.5 mg total)by mouth 2 (two) times daily as needed for anxiety.   losartan-hydrochlorothiazide (HYZAAR) 100-25 MG tablet Take 1 tablet by mouth daily.   potassium chloride SA (KLOR-CON M) 20 MEQ tablet TAKE ONE (1) TABLET BY MOUTH TWO (2) TIMES  DAILY   [DISCONTINUED] donepezil (ARICEPT) 5 MG tablet Take 1 tablet (5 mg total) by mouth at bedtime. (Patient not taking: Reported on 03/08/2022)   [DISCONTINUED] fluticasone (FLONASE) 50 MCG/ACT nasal spray Place 1 spray into both nostrils daily as needed. (Patient not taking: Reported on 03/08/2022)   No facility-administered medications prior to visit.    Review of Systems per HPI     Objective    BP (!) 170/78 (BP Location: Right Arm, Patient Position: Sitting, Cuff Size: Small)   Pulse 76   Temp 97.6 F (36.4 C) (Oral)   Resp 16   Wt 100 lb 8 oz (45.6 kg)   BMI 18.38 kg/m    Physical Exam Vitals reviewed.  Constitutional:      General: She is not in acute distress.    Appearance: Normal appearance. She is well-developed. She is not diaphoretic.  HENT:     Head: Normocephalic and atraumatic.  Eyes:     General: No scleral icterus.    Conjunctiva/sclera: Conjunctivae normal.  Neck:     Thyroid: No thyromegaly.  Cardiovascular:     Rate and Rhythm: Normal rate and regular rhythm.     Pulses: Normal pulses.     Heart sounds: Normal heart sounds. No murmur heard. Pulmonary:     Effort: Pulmonary effort is normal. No respiratory distress.     Breath sounds: Normal breath sounds. No wheezing, rhonchi or rales.  Musculoskeletal:     Cervical back: Neck supple.     Right lower leg: No edema.     Left lower leg: No edema.  Lymphadenopathy:     Cervical: No cervical adenopathy.  Skin:  General: Skin is warm and dry.     Findings: No rash.  Neurological:     Mental Status: She is alert and oriented to person, place, and time. Mental status is at baseline.  Psychiatric:        Attention and Perception: Attention normal.        Mood and Affect: Mood is anxious.        Speech: Speech normal.        Behavior: Behavior is cooperative.        Thought Content: Thought content does not include suicidal ideation.       No results found for any visits on 03/08/22.   Assessment & Plan     Problem List Items Addressed This Visit       Other   Anxiety - Primary    Chronic and uncontrolled Concern that it may be contributing to perception of memory loss She worries a lot about her husband as he suffers from frequent syncopal episodes Her sister also has significant dementia Discussed importance of benzo avoidance in the setting of memory concerns Will Start Zoloft 25 mg daily Discussed potential side effects, incl GI upset, sexual dysfunction, and SI Discussed that it can take 6-8 weeks to reach full efficacy Contracted for safety - no SI/HI Discussed synergistic effects of medications and therapy       Relevant Medications   sertraline (ZOLOFT) 25 MG tablet   Memory change    MRI with no significant findings Upcoming appt with Neuro Did not tolerate Aricept See above concern about anxiety playing a role        Return in about 6 weeks (around 04/19/2022) for anxiety f/u.      I, Shirlee Latch, MD, have reviewed all documentation for this visit. The documentation on 03/08/22 for the exam, diagnosis, procedures, and orders are all accurate and complete.   Aleayah Chico, Marzella Schlein, MD, MPH [facility]

## 2022-03-08 ENCOUNTER — Encounter: Payer: Self-pay | Admitting: Family Medicine

## 2022-03-08 ENCOUNTER — Ambulatory Visit (INDEPENDENT_AMBULATORY_CARE_PROVIDER_SITE_OTHER): Payer: Medicare PPO | Admitting: Family Medicine

## 2022-03-08 VITALS — BP 170/78 | HR 76 | Temp 97.6°F | Resp 16 | Wt 100.5 lb

## 2022-03-08 DIAGNOSIS — R413 Other amnesia: Secondary | ICD-10-CM

## 2022-03-08 DIAGNOSIS — F419 Anxiety disorder, unspecified: Secondary | ICD-10-CM

## 2022-03-08 MED ORDER — SERTRALINE HCL 25 MG PO TABS: 25.0000 mg | ORAL_TABLET | Freq: Every day | ORAL | 3 refills | Status: DC

## 2022-03-08 NOTE — Assessment & Plan Note (Signed)
MRI with no significant findings Upcoming appt with Neuro Did not tolerate Aricept See above concern about anxiety playing a role

## 2022-03-08 NOTE — Assessment & Plan Note (Addendum)
Chronic and uncontrolled Concern that it may be contributing to perception of memory loss She worries a lot about her husband as he suffers from frequent syncopal episodes Her sister also has significant dementia Discussed importance of benzo avoidance in the setting of memory concerns Will Start Zoloft 25 mg daily Discussed potential side effects, incl GI upset, sexual dysfunction, and SI Discussed that it can take 6-8 weeks to reach full efficacy Contracted for safety - no SI/HI Discussed synergistic effects of medications and therapy

## 2022-03-12 DIAGNOSIS — R251 Tremor, unspecified: Secondary | ICD-10-CM | POA: Diagnosis not present

## 2022-03-12 DIAGNOSIS — E559 Vitamin D deficiency, unspecified: Secondary | ICD-10-CM | POA: Diagnosis not present

## 2022-03-12 DIAGNOSIS — R413 Other amnesia: Secondary | ICD-10-CM | POA: Diagnosis not present

## 2022-03-12 DIAGNOSIS — I10 Essential (primary) hypertension: Secondary | ICD-10-CM | POA: Diagnosis not present

## 2022-03-12 DIAGNOSIS — F419 Anxiety disorder, unspecified: Secondary | ICD-10-CM | POA: Diagnosis not present

## 2022-03-14 ENCOUNTER — Ambulatory Visit: Payer: Self-pay

## 2022-03-14 NOTE — Telephone Encounter (Signed)
  Chief Complaint: Medication not working Symptoms: Leg tremors Frequency: 15-20 minutes Pertinent Negatives: Patient denies  Disposition: [] ED /[] Urgent Care (no appt availability in office) / [] Appointment(In office/virtual)/ []  [place] [facility]/ [] Home Care/ [] Refused Recommended Disposition /[] [place] [facility]/ [x]  Follow-up with PCP Additional Notes: Call from pt's husband, . states that Zoloft has made no difference in pt's leg tremors. How long does it take for medication to start working? Or does pt need a different medication.   Please advise.     Summary: Leg tremors   Patient spouse states sertraline (ZOLOFT) 25 MG tablet is not working, patient continues to tremor in legs. Requesting another medication.         Reason for Disposition  [1] Caller has URGENT medicine question about med that PCP or specialist prescribed AND [2] triager unable to answer question  Answer Assessment - Initial Assessment Questions 1. NAME of MEDICINE: "What medicine(s) are you calling about?"     Zoloft 2. QUESTION: "What is your question?" (e.g., double dose of medicine, side effect)     Does not seem to be working for tremors 3. PRESCRIBER: "Who prescribed the medicine?" Reason: if prescribed by specialist, call should be referred to that group.      4. SYMPTOMS: "Do you have any symptoms?" If Yes, ask: "What symptoms are you having?"  "How bad are the symptoms (e.g., mild, moderate, severe)      5. PREGNANCY:  "Is there any chance that you are pregnant?" "When was your last menstrual period?"  Protocols used: Medication Question Call-A-AH

## 2022-03-14 NOTE — Telephone Encounter (Signed)
Patient advised as below.  

## 2022-03-14 NOTE — Telephone Encounter (Signed)
Patient spouse states sertraline (ZOLOFT) 25 MG tablet is not working, patient continues to tremor in legs. Requesting another medication.  Left message to call back about symptoms.

## 2022-03-14 NOTE — Telephone Encounter (Signed)
Zoloft is for anxiety that may be underlying the tremulousness.  It builds slowly over 6-8 weeks time.  Would not switch it yet.

## 2022-03-14 NOTE — Telephone Encounter (Signed)
Second attempt to reach pt. Someone picked up phone and hung up.

## 2022-03-18 ENCOUNTER — Other Ambulatory Visit: Payer: Self-pay | Admitting: Family Medicine

## 2022-03-18 MED ORDER — SERTRALINE HCL 25 MG PO TABS: 25.0000 mg | ORAL_TABLET | Freq: Every day | ORAL | 3 refills | Status: DC

## 2022-03-19 DIAGNOSIS — E538 Deficiency of other specified B group vitamins: Secondary | ICD-10-CM | POA: Diagnosis not present

## 2022-03-26 DIAGNOSIS — E538 Deficiency of other specified B group vitamins: Secondary | ICD-10-CM | POA: Diagnosis not present

## 2022-04-02 DIAGNOSIS — E538 Deficiency of other specified B group vitamins: Secondary | ICD-10-CM | POA: Diagnosis not present

## 2022-04-04 ENCOUNTER — Encounter: Payer: Self-pay | Admitting: Obstetrics and Gynecology

## 2022-04-09 DIAGNOSIS — E538 Deficiency of other specified B group vitamins: Secondary | ICD-10-CM | POA: Diagnosis not present

## 2022-04-11 ENCOUNTER — Ambulatory Visit: Payer: Medicare PPO | Admitting: Physician Assistant

## 2022-04-11 ENCOUNTER — Encounter: Payer: Self-pay | Admitting: Physician Assistant

## 2022-04-11 VITALS — BP 161/75 | HR 84 | Ht 62.0 in | Wt 101.9 lb

## 2022-04-11 DIAGNOSIS — R319 Hematuria, unspecified: Secondary | ICD-10-CM | POA: Diagnosis not present

## 2022-04-11 DIAGNOSIS — N390 Urinary tract infection, site not specified: Secondary | ICD-10-CM

## 2022-04-11 LAB — POCT URINALYSIS DIPSTICK
Bilirubin, UA: NEGATIVE
Glucose, UA: NEGATIVE
Ketones, UA: NEGATIVE
Nitrite, UA: NEGATIVE
Protein, UA: POSITIVE — AB
Spec Grav, UA: 1.015 (ref 1.010–1.025)
Urobilinogen, UA: 0.2 E.U./dL
pH, UA: 6 (ref 5.0–8.0)

## 2022-04-11 MED ORDER — CEPHALEXIN 500 MG PO CAPS: 500.0000 mg | ORAL_CAPSULE | Freq: Two times a day (BID) | ORAL | 0 refills | Status: DC

## 2022-04-11 NOTE — Progress Notes (Signed)
Established patient visit   Patient: Susan Booth   DOB: Jul 27, 1930   89 y.o. Female  MRN: [id number] Visit Date: 04/11/2022  Today's healthcare provider: Mardene Speak, PA-C   Chief Complaint  Patient presents with   Urinary Tract Infection    Patient states that she has had the urge to urinate with no output. Patient states that she experienced body aches this morning when she went to use the restroom.   Subjective     HPI     Urinary Tract Infection   This is a recurrent problem.  Recent episode started yesterday.  The problem has been gradually improving since onset.  Severity of the pain is none.  The problem occurs intermittently.  The patient  has not been recently treated for similar symptoms.  Abdominal Pain: Absent.  Back Pain: Absent.  Chills: Absent.  Cloudy malodorus urine: Absent.  Constipation: Absent.  Cramping: Absent.  Diarrhea: Present.  Discharge: Absent.  Fever: Absent.  Hematuria: Absent.  Nausea: Absent.  Vomiting: Absent. Additional comments: Patient states that she has had the urge to urinate with no output. Patient states that she experienced body aches this morning when she went to use the restroom.      Last edited by Sarina Ill, CMA on 04/11/2022  3:51 PM.       Medications: Outpatient Medications Prior to Visit  Medication Sig   aspirin EC 81 MG tablet Take 81 mg by mouth daily.   Calcium Carbonate-Vitamin D 600-400 MG-UNIT per tablet CALTRATE 600+D, 600-400MG -UNIT (Oral Tablet)  1 Every Day for 0 days  Quantity: 0.00;  Refills: 0   Ordered :12-Jan-2010  Abran Richard ;  Started 15-Aug-2008 Active Comments: DX: 733.90   LORazepam (ATIVAN) 0.5 MG tablet Take 1 tablet (0.5 mg total)by mouth 2 (two) times daily as needed for anxiety.   losartan-hydrochlorothiazide (HYZAAR) 100-25 MG tablet Take 1 tablet by mouth daily.   potassium chloride SA (KLOR-CON M) 20 MEQ tablet TAKE ONE (1) TABLET BY MOUTH TWO (2) TIMES DAILY   sertraline  (ZOLOFT) 25 MG tablet Take 1 tablet (25 mg total) by mouth daily.   No facility-administered medications prior to visit.    Review of Systems  All other systems reviewed and are negative. Except see HPI    Objective    BP (!) 161/75 (BP Location: Left Arm, Patient Position: Sitting, Cuff Size: Normal)   Pulse 84   Ht 5\' 2"  (1.575 m)   Wt 101 lb 14.4 oz (46.2 kg)   SpO2 97%   BMI 18.64 kg/m    Physical Exam Vitals reviewed.  Constitutional:      General: She is not in acute distress.    Appearance: She is well-developed.  HENT:     Head: Normocephalic and atraumatic.  Eyes:     General: No scleral icterus.    Conjunctiva/sclera: Conjunctivae normal.  Cardiovascular:     Rate and Rhythm: Normal rate and regular rhythm.     Heart sounds: Normal heart sounds. No murmur heard. Pulmonary:     Effort: Pulmonary effort is normal. No respiratory distress.     Breath sounds: Normal breath sounds. No wheezing or rales.  Abdominal:     General: There is no distension.     Palpations: Abdomen is soft.     Tenderness: There is abdominal tenderness (on urination) in the suprapubic area. There is no guarding or rebound.  Skin:    General: Skin is warm and  dry.     Capillary Refill: Capillary refill takes less than 2 seconds.     Findings: No rash.  Neurological:     Mental Status: She is alert and oriented to person, place, and time.  Psychiatric:        Behavior: Behavior normal.        Thought Content: Thought content normal.        Judgment: Judgment normal.       No results found for any visits on 04/11/22.  Assessment & Plan     1. Urinary tract infection with hematuria, site unspecified Recurrent Symptomatic - POCT Urinalysis Dipstick pos for leukocytosis, cloudy, protein - cephALEXin (KEFLEX) 500 MG capsule; Take 1 capsule (500 mg total) by mouth 2 (two) times daily.  Dispense: 14 capsule; Refill: 0 - Urine Culture - Urine Microscopic Pt was advised to stop  taking antibiotics if urine culture will be negative The patient was advised to call back or seek an in-person evaluation if the symptoms worsen or if the condition fails to improve as anticipated.  I discussed the assessment and treatment plan with the patient. The patient was provided an opportunity to ask questions and all were answered. The patient agreed with the plan and demonstrated an understanding of the instructions.  The entirety of the information documented in the History of Present Illness, Review of Systems and Physical Exam were personally obtained by me. Portions of this information were initially documented by the CMA and reviewed by me for thoroughness and accuracy.   Mardene Speak, [facility], [place] [phone] (phone) [phone] (fax)

## 2022-04-12 LAB — URINALYSIS, MICROSCOPIC ONLY
Bacteria, UA: NONE SEEN
Casts: NONE SEEN /lpf

## 2022-04-13 LAB — URINE CULTURE

## 2022-04-18 NOTE — Progress Notes (Signed)
I,Sulibeya S Dimas,acting as a Education administrator for Lavon Paganini, MD.,have documented all relevant documentation on the behalf of Lavon Paganini, MD,as directed by  Lavon Paganini, MD while in the presence of Lavon Paganini, MD.     Established patient visit   Patient: Susan Booth   DOB: October 05, 1930   89 y.o. Female  MRN: [id number] Visit Date: 04/19/2022  Today's healthcare provider: Lavon Paganini, MD   Chief Complaint  Patient presents with   Anxiety   Subjective    HPI  Anxiety, Follow-up  She was last seen for anxiety 6 weeks ago. Changes made at last visit include start Zoloft 25 mg daily.   She reports excellent compliance with treatment. Patient took a full 30 day. Has not refilled her medication. She reports she is not sure if she was suppose to get refill.  She reports excellent tolerance of treatment. She is not having side effects.   She feels her anxiety is mild and Improved since last visit.  GAD-7 Results    04/19/2022   11:03 AM 07/28/2020   10:05 AM 06/29/2019   10:33 AM  GAD-7 Generalized Anxiety Disorder Screening Tool  1. Feeling Nervous, Anxious, or on Edge 1 0 2  2. Not Being Able to Stop or Control Worrying 1 1 1   3. Worrying Too Much About Different Things 1 1 1   4. Trouble Relaxing 0 0 1  5. Being So Restless it's Hard To Sit Still 0 0 0  6. Becoming Easily Annoyed or Irritable 0 0 0  7. Feeling Afraid As If Something Awful Might Happen 0 0 0  Total GAD-7 Score 3 2 5   Difficulty At Work, Home, or Getting  Along With Others? Not difficult at all Not difficult at all Not difficult at all    PHQ-9 Scores    04/19/2022   11:02 AM 03/05/2022   11:29 AM 02/08/2022   11:10 AM  PHQ9 SCORE ONLY  PHQ-9 Total Score 0 0 5    --------------------------------------------------------------------------------------------------- Having diarrhea. Worse on medications, somewhat better recently  Medications: Outpatient Medications Prior to  Visit  Medication Sig   aspirin EC 81 MG tablet Take 81 mg by mouth daily.   Calcium Carbonate-Vitamin D 600-400 MG-UNIT per tablet CALTRATE 600+D, 600-400MG -UNIT (Oral Tablet)  1 Every Day for 0 days  Quantity: 0.00;  Refills: 0   Ordered :12-Jan-2010  Abran Richard ;  Started 15-Aug-2008 Active Comments: DX: 733.90   LORazepam (ATIVAN) 0.5 MG tablet Take 1 tablet (0.5 mg total)by mouth 2 (two) times daily as needed for anxiety.   losartan-hydrochlorothiazide (HYZAAR) 100-25 MG tablet Take 1 tablet by mouth daily.   potassium chloride SA (KLOR-CON M) 20 MEQ tablet TAKE ONE (1) TABLET BY MOUTH TWO (2) TIMES DAILY   [DISCONTINUED] cephALEXin (KEFLEX) 500 MG capsule Take 1 capsule (500 mg total) by mouth 2 (two) times daily. (Patient not taking: Reported on 04/19/2022)   [DISCONTINUED] sertraline (ZOLOFT) 25 MG tablet Take 1 tablet (25 mg total) by mouth daily. (Patient not taking: Reported on 04/19/2022)   No facility-administered medications prior to visit.    Review of Systems  Constitutional:  Negative for chills and fever.  Respiratory:  Positive for cough. Negative for shortness of breath and wheezing.   Cardiovascular:  Negative for chest pain and leg swelling.  Gastrointestinal:  Negative for abdominal pain, nausea and vomiting.  Neurological:  Negative for dizziness, light-headedness and headaches.  Psychiatric/Behavioral:  Positive for decreased concentration. Negative for sleep disturbance. The  patient is nervous/anxious.        Objective    BP (!) 135/55 (BP Location: Left Arm, Patient Position: Sitting, Cuff Size: Normal)   Pulse 75   Temp 97.8 F (36.6 C) (Temporal)   Resp 16   Wt 100 lb (45.4 kg)   BMI 18.29 kg/m    Physical Exam Vitals reviewed.  Constitutional:      General: She is not in acute distress.    Appearance: She is well-developed.  HENT:     Head: Normocephalic and atraumatic.  Eyes:     General: No scleral icterus.    Conjunctiva/sclera:  Conjunctivae normal.  Cardiovascular:     Rate and Rhythm: Normal rate and regular rhythm.     Heart sounds: Normal heart sounds.  Pulmonary:     Effort: Pulmonary effort is normal. No respiratory distress.  Abdominal:     General: There is no distension.     Palpations: Abdomen is soft.     Tenderness: There is no abdominal tenderness.  Skin:    General: Skin is warm and dry.     Findings: No rash.  Neurological:     Mental Status: She is alert. Mental status is at baseline.  Psychiatric:        Behavior: Behavior normal.       No results found for any visits on 04/19/22.  Assessment & Plan     Problem List Items Addressed This Visit       Other   Anxiety - Primary    Improving with Zoloft Has been off of it for a week or so as she did not know if she should refill Resume Zoloft 25mg  daily Try at bedtime to minimize symptoms      Relevant Medications   sertraline (ZOLOFT) 25 MG tablet   Diarrhea    Longstanding intermittent issue Was worsened when on SSRI Patient does think it is getting better though Will continue with SSRI (patient declines changing at this time) Ok to take immodium prn Will f/u if worsening or not improving        Return for as scheduled.      I, Lavon Paganini, MD, have reviewed all documentation for this visit. The documentation on 04/19/22 for the exam, diagnosis, procedures, and orders are all accurate and complete.   Lilie Vezina, Dionne Bucy, MD, MPH [facility]

## 2022-04-19 ENCOUNTER — Ambulatory Visit: Payer: Medicare PPO | Admitting: Family Medicine

## 2022-04-19 ENCOUNTER — Encounter: Payer: Self-pay | Admitting: Family Medicine

## 2022-04-19 VITALS — BP 135/55 | HR 75 | Temp 97.8°F | Resp 16 | Wt 100.0 lb

## 2022-04-19 DIAGNOSIS — F419 Anxiety disorder, unspecified: Secondary | ICD-10-CM | POA: Diagnosis not present

## 2022-04-19 DIAGNOSIS — R197 Diarrhea, unspecified: Secondary | ICD-10-CM | POA: Diagnosis not present

## 2022-04-19 MED ORDER — SERTRALINE HCL 25 MG PO TABS: 25.0000 mg | ORAL_TABLET | Freq: Every day | ORAL | 1 refills | Status: DC

## 2022-04-19 NOTE — Assessment & Plan Note (Signed)
Longstanding intermittent issue Was worsened when on SSRI Patient does think it is getting better though Will continue with SSRI (patient declines changing at this time) Ok to take immodium prn Will f/u if worsening or not improving

## 2022-04-19 NOTE — Patient Instructions (Addendum)
Resume Sertraline (Zoloft) 25 mg once daily.  Can take at bedtime to minimize stomach side effects.  The stomach side effects should get better as you stay on the medication.  Will plan to continue the medication until further notice.  Can use Imodium as needed for diarrhea that comes as a side effect.

## 2022-04-19 NOTE — Assessment & Plan Note (Signed)
Improving with Zoloft Has been off of it for a week or so as she did not know if she should refill Resume Zoloft 25mg  daily Try at bedtime to minimize symptoms

## 2022-05-06 ENCOUNTER — Other Ambulatory Visit: Payer: Self-pay | Admitting: Family Medicine

## 2022-05-07 ENCOUNTER — Other Ambulatory Visit: Payer: Self-pay | Admitting: Family Medicine

## 2022-05-10 DIAGNOSIS — E538 Deficiency of other specified B group vitamins: Secondary | ICD-10-CM | POA: Diagnosis not present

## 2022-05-21 DIAGNOSIS — F419 Anxiety disorder, unspecified: Secondary | ICD-10-CM | POA: Diagnosis not present

## 2022-05-21 DIAGNOSIS — I10 Essential (primary) hypertension: Secondary | ICD-10-CM | POA: Diagnosis not present

## 2022-05-21 DIAGNOSIS — R413 Other amnesia: Secondary | ICD-10-CM | POA: Diagnosis not present

## 2022-05-21 DIAGNOSIS — E538 Deficiency of other specified B group vitamins: Secondary | ICD-10-CM | POA: Diagnosis not present

## 2022-06-03 ENCOUNTER — Ambulatory Visit (INDEPENDENT_AMBULATORY_CARE_PROVIDER_SITE_OTHER): Payer: Medicare PPO | Admitting: Family Medicine

## 2022-06-03 ENCOUNTER — Encounter: Payer: Self-pay | Admitting: Family Medicine

## 2022-06-03 VITALS — BP 173/82 | HR 69 | Temp 98.2°F | Resp 12 | Ht 59.0 in | Wt 104.0 lb

## 2022-06-03 DIAGNOSIS — M810 Age-related osteoporosis without current pathological fracture: Secondary | ICD-10-CM

## 2022-06-03 DIAGNOSIS — I10 Essential (primary) hypertension: Secondary | ICD-10-CM | POA: Diagnosis not present

## 2022-06-03 DIAGNOSIS — R413 Other amnesia: Secondary | ICD-10-CM

## 2022-06-03 DIAGNOSIS — Z0000 Encounter for general adult medical examination without abnormal findings: Secondary | ICD-10-CM | POA: Diagnosis not present

## 2022-06-03 MED ORDER — LOSARTAN POTASSIUM-HCTZ 100-25 MG PO TABS: 1.0000 | ORAL_TABLET | Freq: Every day | ORAL | 3 refills | Status: AC

## 2022-06-03 NOTE — Assessment & Plan Note (Signed)
Declines further DEXA testing as it would not change management for her at this time

## 2022-06-03 NOTE — Patient Instructions (Addendum)
Call and make a new appointment with [facility], but with Dr Melrose Nakayama instead of Dr Manuella Ghazi for second opinion given the concerns you have from the last visit. [phone]   Get a tetanus shot at the pharmacy

## 2022-06-03 NOTE — Assessment & Plan Note (Signed)
F/b neuro Complains of a strange interaction with Dr Manuella Ghazi with him leaving the room while she was talking  Wishes for second opinion - will call and see if she can see Dr Melrose Nakayama instead

## 2022-06-03 NOTE — Progress Notes (Signed)
I,Sulibeya S Dimas,acting as a Education administrator for Lavon Paganini, MD.,have documented all relevant documentation on the behalf of Lavon Paganini, MD,as directed by  Lavon Paganini, MD while in the presence of Lavon Paganini, MD.    Complete physical exam   Patient: Susan Booth   DOB: 1930-05-10   89 y.o. Female  MRN: [id number] Visit Date: 06/03/2022  Today's healthcare provider: Lavon Paganini, MD   Chief Complaint  Patient presents with   Annual Exam   Subjective    Susan Booth is a 89 y.o. female who presents today for a complete physical exam.  She reports consuming a general diet. The patient does not participate in regular exercise at present. She generally feels fairly well. She reports sleeping fairly well. She does not have additional problems to discuss today.  HPI   Concerned about last visit to Neurology - states that Dr Manuella Ghazi abruptly left the room and documents things in the notes that aren't true (her husband was there to witness this) - such as feeling isolated at home and gaining weight and losing appetite.  Past Medical History:  Diagnosis Date   Anxiety    Benign essential tremor    Complication of anesthesia    nausea   COVID 01/02/2022   Gastroesophageal reflux disease    Hypertension    Lichen sclerosus et atrophicus of the vulva    Murmur    Need for SBE (subacute bacterial endocarditis) prophylaxis    Osteoporosis, post-menopausal    Other chest pain    Paresthesia    Rhinitis, allergic    Past Surgical History:  Procedure Laterality Date   ABDOMINAL HYSTERECTOMY  1968   ASD REPAIR, SECUNDUM  03/2007   CATARACT EXTRACTION Bilateral 06/2012   CORRECTION HAMMER TOE Bilateral 2003   hole in heart repaired     continue to have some leakage   ORIF PATELLA Left 11/24/2015   Procedure: OPEN REDUCTION INTERNAL (ORIF) FIXATION PATELLA;  Surgeon: Thornton Park, MD;  Location: [facility] ORS;  Service: Orthopedics;  Laterality: Left;   (PER Dr Tanja Port Spinal would be ideal    Social History   Socioeconomic History   Marital status: Married    Spouse name: Eulas Post   Number of children: 2   Years of education: Some College   Highest education level: Some college, no degree  Occupational History   Occupation: Retired  Tobacco Use   Smoking status: Never   Smokeless tobacco: Never  Vaping Use   Vaping Use: Never used  Substance and Sexual Activity   Alcohol use: Yes    Comment: rare - 1 glass of wine   Drug use: No   Sexual activity: Never  Other Topics Concern   Not on file  Social History Narrative   Not on file   Social Determinants of Health   Financial Resource Strain: Low Risk  (03/05/2022)   Overall Financial Resource Strain (CARDIA)    Difficulty of Paying Living Expenses: Not hard at all  Food Insecurity: No Food Insecurity (03/05/2022)   Hunger Vital Sign    Worried About Running Out of Food in the Last Year: Never true    [place] in the Last Year: Never true  Transportation Needs: No Transportation Needs (03/05/2022)   PRAPARE - Hydrologist (Medical): No    Lack of Transportation (Non-Medical): No  Physical Activity: Insufficiently Active (03/05/2022)   Exercise Vital Sign    Days of Exercise per  Week: 2 days    Minutes of Exercise per Session: 20 min  Stress: No Stress Concern Present (03/05/2022)   [place]    Feeling of Stress : Not at all  Social Connections: Moderately Integrated (03/05/2022)   Social Connection and Isolation Panel [NHANES]    Frequency of Communication with Friends and Family: More than three times a week    Frequency of Social Gatherings with Friends and Family: Once a week    Attends Religious Services: More than 4 times per year    Active Member of [employer] or Organizations: No    Attends Archivist Meetings: Never    Marital Status: Married  Arboriculturist Violence: Not At Risk (03/05/2022)   Humiliation, Afraid, Rape, and Kick questionnaire    Fear of Current or Ex-Partner: No    Emotionally Abused: No    Physically Abused: No    Sexually Abused: No   Family Status  Relation Name Status   Mother  Deceased at age 68   Father  Deceased at age 57   Sister  Alive   [place]  Deceased   Son  Alive   Son  [place]   Family History  Problem Relation Age of Onset   COPD Mother    Early death Mother    Lung cancer Mother    Heart disease Father    Heart attack Father    Heart disease Sister        heart valve problem   Dementia Sister    Breast cancer Maternal Aunt 81   Prostate cancer Son    Prostate cancer Son    No Known Allergies  Patient Care Team: Danniel Grenz, Dionne Bucy, MD as PCP - General (Family Medicine) Dingeldein, Remo Lipps, MD as Consulting Physician (Ophthalmology) Ubaldo Glassing Javier Docker, MD as Consulting Physician (Cardiology) Rubie Maid, MD as Referring Physician (Obstetrics and Gynecology)   Medications: Outpatient Medications Prior to Visit  Medication Sig   aspirin EC 81 MG tablet Take 81 mg by mouth daily.   Calcium Carbonate-Vitamin D 600-400 MG-UNIT per tablet CALTRATE 600+D, 600-'400MG'$ -UNIT (Oral Tablet)  1 Every Day for 0 days  Quantity: 0.00;  Refills: 0   Ordered :12-Jan-2010  Abran Richard ;  Started 15-Aug-2008 Active Comments: DX: 733.90   LORazepam (ATIVAN) 0.5 MG tablet Take 1 tablet (0.5 mg total)by mouth 2 (two) times daily as needed for anxiety.   potassium chloride SA (KLOR-CON M) 20 MEQ tablet TAKE ONE (1) TABLET BY MOUTH TWO (2) TIMES DAILY   sertraline (ZOLOFT) 25 MG tablet Take 1 tablet (25 mg total) by mouth daily.   [DISCONTINUED] losartan-hydrochlorothiazide (HYZAAR) 100-25 MG tablet Take 1 tablet by mouth daily.   No facility-administered medications prior to visit.    Review of Systems  All other systems reviewed and are negative.   Last CBC Lab Results  Component Value Date   WBC  4.4 01/07/2019   HGB 14.0 01/07/2019   HCT 41.1 01/07/2019   MCV 92 01/07/2019   MCH 31.3 01/07/2019   RDW 12.5 01/07/2019   PLT 249 [id number]   Last metabolic panel Lab Results  Component Value Date   GLUCOSE 102 (H) 07/06/2021   NA 136 07/06/2021   K 3.7 07/06/2021   CL 98 07/06/2021   CO2 24 07/06/2021   BUN 15 07/06/2021   CREATININE 0.73 07/06/2021   EGFR 79 07/06/2021   CALCIUM 9.8 07/06/2021   PROT 6.7 02/06/2021  ALBUMIN 4.0 02/06/2021   LABGLOB 2.7 02/06/2021   AGRATIO 1.5 02/06/2021   BILITOT 0.6 02/06/2021   ALKPHOS 87 02/06/2021   AST 20 02/06/2021   ALT 10 02/06/2021   ANIONGAP 5 11/25/2015   Last lipids Lab Results  Component Value Date   CHOL 170 02/06/2021   HDL 55 02/06/2021   LDLCALC 107 (H) 02/06/2021   TRIG 40 02/06/2021   CHOLHDL 3.1 01/28/2020   Last hemoglobin A1c Lab Results  Component Value Date   HGBA1C 5.4 02/06/2021   Last thyroid functions Lab Results  Component Value Date   TSH 1.580 11/24/2017      Objective    BP (!) 173/82 (BP Location: Left Arm, Patient Position: Sitting, Cuff Size: Normal)   Pulse 69   Temp 98.2 F (36.8 C) (Temporal)   Resp 12   Ht '4\' 11"'$  (1.499 m)   Wt 104 lb (47.2 kg)   SpO2 98%   BMI 21.01 kg/m  BP Readings from Last 3 Encounters:  06/03/22 (!) 173/82  04/19/22 (!) 135/55  04/11/22 (!) 161/75   Wt Readings from Last 3 Encounters:  06/03/22 104 lb (47.2 kg)  04/19/22 100 lb (45.4 kg)  04/11/22 101 lb 14.4 oz (46.2 kg)      Physical Exam Vitals reviewed.  Constitutional:      General: She is not in acute distress.    Appearance: Normal appearance. She is well-developed. She is not diaphoretic.  HENT:     Head: Normocephalic and atraumatic.  Eyes:     General: No scleral icterus.    Conjunctiva/sclera: Conjunctivae normal.  Neck:     Thyroid: No thyromegaly.  Cardiovascular:     Rate and Rhythm: Normal rate and regular rhythm.     Pulses: Normal pulses.     Heart sounds:  Normal heart sounds. No murmur heard. Pulmonary:     Effort: Pulmonary effort is normal. No respiratory distress.     Breath sounds: Normal breath sounds. No wheezing, rhonchi or rales.  Musculoskeletal:     Cervical back: Neck supple.     Right lower leg: No edema.     Left lower leg: No edema.  Lymphadenopathy:     Cervical: No cervical adenopathy.  Skin:    General: Skin is warm and dry.     Findings: No rash.  Neurological:     Mental Status: She is alert. Mental status is at baseline.  Psychiatric:        Attention and Perception: Attention normal.        Mood and Affect: Mood is anxious.        Speech: Speech normal.       Last depression screening scores    06/03/2022    9:43 AM 04/19/2022   11:02 AM 03/05/2022   11:29 AM  PHQ 2/9 Scores  PHQ - 2 Score 0 0 0  PHQ- 9 Score 0 0 0   Last fall risk screening    06/03/2022    9:43 AM  Fall Risk   Falls in the past year? 0  Number falls in past yr: 0  Injury with Fall? 0  Risk for fall due to : No Fall Risks  Follow up Falls evaluation completed   Last Audit-C alcohol use screening    06/03/2022    9:44 AM  Alcohol Use Disorder Test (AUDIT)  1. How often do you have a drink containing alcohol? 0  2. How many drinks containing alcohol do you  have on a typical day when you are drinking? 0  3. How often do you have six or more drinks on one occasion? 0  AUDIT-C Score 0   A score of 3 or more in women, and 4 or more in men indicates increased risk for alcohol abuse, EXCEPT if all of the points are from question 1   No results found for any visits on 06/03/22.  Assessment & Plan    Routine Health Maintenance and Physical Exam  Exercise Activities and Dietary recommendations  Goals      DIET - EAT MORE FRUITS AND VEGETABLES     DIET - INCREASE WATER INTAKE     Recommend increasing water intake to 4-6 glasses a day.         Immunization History  Administered Date(s) Administered   Fluad Quad(high Dose 65+)  01/07/2019, 02/08/2022   Influenza, High Dose Seasonal PF 01/05/2014, 12/22/2019   Influenza,inj,quad, With Preservative 12/29/2017   Influenza-Unspecified 12/29/2016, 12/31/2017, 01/02/2021   PFIZER(Purple Top)SARS-COV-2 Vaccination 04/07/2019, 04/28/2019   Pfizer Covid-19 Vaccine Bivalent Booster 43yr & up 01/06/2020, 08/15/2020, 01/02/2021   Pneumococcal Conjugate-13 09/06/2013   Pneumococcal Polysaccharide-23 02/08/2004   Td 07/12/2003   Zoster Recombinat (Shingrix) 02/24/2018, 05/15/2018   Zoster, Live 09/29/2010    Health Maintenance  Topic Date Due   DTaP/Tdap/Td (2 - Tdap) 07/11/2013   COVID-19 Vaccine (6 - 2023-24 season) 11/30/2021   Medicare Annual Wellness (AWV)  03/06/2023   Pneumonia Vaccine 89 Years old  Completed   INFLUENZA VACCINE  Completed   DEXA SCAN  Completed   Zoster Vaccines- Shingrix  Completed   HPV VACCINES  Aged Out    Discussed health benefits of physical activity, and encouraged her to engage in regular exercise appropriate for her age and condition.  Problem List Items Addressed This Visit       Cardiovascular and Mediastinum   Benign hypertension    Elevated today with patient acutely stressed/worried/confused Patient has known white coat hypertension that always increases BP while she is here Monitor home BPs No changes to meds at this time      Relevant Medications   losartan-hydrochlorothiazide (HYZAAR) 100-25 MG tablet   Other Relevant Orders   Comprehensive metabolic panel   Lipid panel     Musculoskeletal and Integument   OP (osteoporosis)    Declines further DEXA testing as it would not change management for her at this time        Other   Memory change    F/b neuro Complains of a strange interaction with Dr SManuella Ghaziwith him leaving the room while she was talking  Wishes for second opinion - will call and see if she can see Dr PMelrose Nakayamainstead      Other Visit Diagnoses     Encounter for annual physical exam    -   Primary   Relevant Orders   Comprehensive metabolic panel   Lipid panel        Return in about 6 months (around 12/04/2022) for chronic disease f/u.     I, ALavon Paganini MD, have reviewed all documentation for this visit. The documentation on 06/03/22 for the exam, diagnosis, procedures, and orders are all accurate and complete.   Jerni Selmer, ADionne Bucy MD, MPH [facility]

## 2022-06-03 NOTE — Assessment & Plan Note (Signed)
Elevated today with patient acutely stressed/worried/confused Patient has known white coat hypertension that always increases BP while she is here Monitor home BPs No changes to meds at this time

## 2022-06-04 LAB — COMPREHENSIVE METABOLIC PANEL
ALT: 14 IU/L (ref 0–32)
AST: 23 IU/L (ref 0–40)
Albumin/Globulin Ratio: 1.5 (ref 1.2–2.2)
Albumin: 4 g/dL (ref 3.7–4.7)
Alkaline Phosphatase: 74 IU/L (ref 44–121)
BUN/Creatinine Ratio: 15 (ref 12–28)
BUN: 15 mg/dL (ref 8–27)
Bilirubin Total: 0.5 mg/dL (ref 0.0–1.2)
CO2: 22 mmol/L (ref 20–29)
Calcium: 9.9 mg/dL (ref 8.7–10.3)
Chloride: 103 mmol/L (ref 96–106)
Creatinine, Ser: 0.97 mg/dL (ref 0.57–1.00)
Globulin, Total: 2.7 g/dL (ref 1.5–4.5)
Glucose: 95 mg/dL (ref 70–99)
Potassium: 3.6 mmol/L (ref 3.5–5.2)
Sodium: 138 mmol/L (ref 134–144)
Total Protein: 6.7 g/dL (ref 6.0–8.5)
eGFR: 56 mL/min/{1.73_m2} — ABNORMAL LOW (ref >59)

## 2022-06-04 LAB — LIPID PANEL
Chol/HDL Ratio: 3.2 ratio (ref 0.0–4.4)
Cholesterol, Total: 175 mg/dL (ref 100–199)
HDL: 55 mg/dL (ref >39)
LDL Chol Calc (NIH): 111 mg/dL — ABNORMAL HIGH (ref 0–99)
Triglycerides: 42 mg/dL (ref 0–149)
VLDL Cholesterol Cal: 9 mg/dL (ref 5–40)

## 2022-06-07 DIAGNOSIS — E538 Deficiency of other specified B group vitamins: Secondary | ICD-10-CM | POA: Diagnosis not present

## 2022-06-24 ENCOUNTER — Other Ambulatory Visit: Payer: Self-pay | Admitting: Family Medicine

## 2022-06-24 DIAGNOSIS — E876 Hypokalemia: Secondary | ICD-10-CM

## 2022-06-25 NOTE — Telephone Encounter (Signed)
Requested Prescriptions  Pending Prescriptions Disp Refills   potassium chloride SA (KLOR-CON M) 20 MEQ tablet [Pharmacy Med Name: POTASSIUM CL ER 20 MEQ TABLET] 180 tablet 1    Sig: TAKE ONE (1) TABLET BY MOUTH TWO (2) TIMES DAILY     Endocrinology:  Minerals - Potassium Supplementation Passed - 06/24/2022  8:21 AM      Passed - K in normal range and within 360 days    Potassium  Date Value Ref Range Status  06/03/2022 3.6 3.5 - 5.2 mmol/L Final  02/21/2013 3.6 3.5 - 5.1 mmol/L Final         Passed - Cr in normal range and within 360 days    Creatinine  Date Value Ref Range Status  02/21/2013 0.77 0.60 - 1.30 mg/dL Final   Creatinine, Ser  Date Value Ref Range Status  06/03/2022 0.97 0.57 - 1.00 mg/dL Final         Passed - Valid encounter within last 12 months    Recent Outpatient Visits           3 weeks ago Encounter for annual physical exam   [place], Dionne Bucy, MD   2 months ago [place], Dionne Bucy, MD   2 months ago Urinary tract infection with hematuria, site unspecified   [place], [place], PA-C   3 months ago [place], Dionne Bucy, MD   4 months ago Memory change   [place], Dionne Bucy, MD       Future Appointments             In 5 months [place], Dionne Bucy, MD [facility]

## 2022-07-08 DIAGNOSIS — E538 Deficiency of other specified B group vitamins: Secondary | ICD-10-CM | POA: Diagnosis not present

## 2022-07-17 DIAGNOSIS — Z0100 Encounter for examination of eyes and vision without abnormal findings: Secondary | ICD-10-CM | POA: Diagnosis not present

## 2022-07-17 DIAGNOSIS — H26491 Other secondary cataract, right eye: Secondary | ICD-10-CM | POA: Diagnosis not present

## 2022-07-29 DIAGNOSIS — G3184 Mild cognitive impairment, so stated: Secondary | ICD-10-CM | POA: Diagnosis not present

## 2022-07-29 DIAGNOSIS — F419 Anxiety disorder, unspecified: Secondary | ICD-10-CM | POA: Diagnosis not present

## 2022-07-29 DIAGNOSIS — I10 Essential (primary) hypertension: Secondary | ICD-10-CM | POA: Diagnosis not present

## 2022-07-29 DIAGNOSIS — E538 Deficiency of other specified B group vitamins: Secondary | ICD-10-CM | POA: Diagnosis not present

## 2022-08-01 ENCOUNTER — Ambulatory Visit: Payer: Medicare PPO | Admitting: Family Medicine

## 2022-08-07 DIAGNOSIS — E538 Deficiency of other specified B group vitamins: Secondary | ICD-10-CM | POA: Diagnosis not present

## 2022-08-13 DIAGNOSIS — E538 Deficiency of other specified B group vitamins: Secondary | ICD-10-CM | POA: Diagnosis not present

## 2022-08-27 DIAGNOSIS — I361 Nonrheumatic tricuspid (valve) insufficiency: Secondary | ICD-10-CM | POA: Diagnosis not present

## 2022-08-27 DIAGNOSIS — I351 Nonrheumatic aortic (valve) insufficiency: Secondary | ICD-10-CM | POA: Diagnosis not present

## 2022-08-27 DIAGNOSIS — I491 Atrial premature depolarization: Secondary | ICD-10-CM | POA: Diagnosis not present

## 2022-08-27 DIAGNOSIS — I10 Essential (primary) hypertension: Secondary | ICD-10-CM | POA: Diagnosis not present

## 2022-08-29 ENCOUNTER — Other Ambulatory Visit: Payer: Self-pay | Admitting: Family Medicine

## 2022-08-29 DIAGNOSIS — F419 Anxiety disorder, unspecified: Secondary | ICD-10-CM

## 2022-09-10 NOTE — Progress Notes (Unsigned)
     I,Breah Joa,acting as a Neurosurgeon for [employer], PA-C.,have documented all relevant documentation on the behalf of Alfredia Ferguson, PA-C,as directed by  Alfredia Ferguson, PA-C while in the presence of Alfredia Ferguson, PA-C.   Established patient visit   Patient: Susan Booth   DOB: 1930-06-29   89 y.o. Female  MRN: [id number] Visit Date: 09/11/2022  Today's healthcare provider: Alfredia Ferguson, PA-C   No chief complaint on file.  Subjective    HPI  ***  Medications: Outpatient Medications Prior to Visit  Medication Sig   aspirin EC 81 MG tablet Take 81 mg by mouth daily.   Calcium Carbonate-Vitamin D 600-400 MG-UNIT per tablet CALTRATE 600+D, 600-400MG -UNIT (Oral Tablet)  1 Every Day for 0 days  Quantity: 0.00;  Refills: 0   Ordered :12-Jan-2010  [name] ;  Started 15-Aug-2008 Active Comments: DX: 733.90   LORazepam (ATIVAN) 0.5 MG tablet Take 1 tablet (0.5 mg total)by mouth 2 (two) times daily as needed for anxiety.   losartan-hydrochlorothiazide (HYZAAR) 100-25 MG tablet Take 1 tablet by mouth daily.   potassium chloride SA (KLOR-CON M) 20 MEQ tablet TAKE ONE (1) TABLET BY MOUTH TWO (2) TIMES DAILY   sertraline (ZOLOFT) 25 MG tablet Take 1 tablet (25 mg total) by mouth daily.   No facility-administered medications prior to visit.    Review of Systems  {Labs  Heme  Chem  Endocrine  Serology  Results Review (optional):23779}   Objective    There were no vitals taken for this visit. {Show previous Marayah Higdon signs (optional):23777}  Physical Exam  ***  No results found for any visits on 09/11/22.  Assessment & Plan     ***  No follow-ups on file.      {provider attestation***:1}   Alfredia Ferguson, PA-C  [facility] [phone] (phone) [phone] (fax)  [facility]

## 2022-09-11 ENCOUNTER — Encounter: Payer: Self-pay | Admitting: Physician Assistant

## 2022-09-11 ENCOUNTER — Ambulatory Visit: Payer: Medicare PPO | Admitting: Physician Assistant

## 2022-09-11 ENCOUNTER — Other Ambulatory Visit: Payer: Self-pay

## 2022-09-11 VITALS — BP 138/88 | HR 47 | Temp 97.7°F | Ht 64.0 in | Wt 100.0 lb

## 2022-09-11 DIAGNOSIS — F419 Anxiety disorder, unspecified: Secondary | ICD-10-CM

## 2022-09-11 DIAGNOSIS — I10 Essential (primary) hypertension: Secondary | ICD-10-CM

## 2022-09-11 MED ORDER — SERTRALINE HCL 25 MG PO TABS
25.0000 mg | ORAL_TABLET | Freq: Every day | ORAL | 1 refills | Status: AC
Start: 2022-09-11 — End: ?

## 2022-09-11 NOTE — Assessment & Plan Note (Signed)
Manages with zoloft 25 mg  Clarified purpose for patient

## 2022-09-11 NOTE — Assessment & Plan Note (Signed)
Well controlled with losartan 100 hctz 25 mg  Reviewed cmp  Clarified meds with pt

## 2022-10-22 DIAGNOSIS — F411 Generalized anxiety disorder: Secondary | ICD-10-CM | POA: Diagnosis not present

## 2022-10-22 DIAGNOSIS — R4181 Age-related cognitive decline: Secondary | ICD-10-CM | POA: Diagnosis not present

## 2022-10-22 DIAGNOSIS — I10 Essential (primary) hypertension: Secondary | ICD-10-CM | POA: Diagnosis not present

## 2022-10-22 DIAGNOSIS — F331 Major depressive disorder, recurrent, moderate: Secondary | ICD-10-CM | POA: Diagnosis not present

## 2022-10-22 DIAGNOSIS — E559 Vitamin D deficiency, unspecified: Secondary | ICD-10-CM | POA: Diagnosis not present

## 2022-10-30 DIAGNOSIS — F331 Major depressive disorder, recurrent, moderate: Secondary | ICD-10-CM | POA: Diagnosis not present

## 2022-10-30 DIAGNOSIS — I10 Essential (primary) hypertension: Secondary | ICD-10-CM | POA: Diagnosis not present

## 2022-10-30 DIAGNOSIS — F411 Generalized anxiety disorder: Secondary | ICD-10-CM | POA: Diagnosis not present

## 2022-11-02 DIAGNOSIS — S065X0A Traumatic subdural hemorrhage without loss of consciousness, initial encounter: Secondary | ICD-10-CM | POA: Diagnosis not present

## 2022-11-02 DIAGNOSIS — W19XXXA Unspecified fall, initial encounter: Secondary | ICD-10-CM | POA: Diagnosis not present

## 2022-11-02 DIAGNOSIS — E785 Hyperlipidemia, unspecified: Secondary | ICD-10-CM | POA: Diagnosis not present

## 2022-11-02 DIAGNOSIS — I440 Atrioventricular block, first degree: Secondary | ICD-10-CM | POA: Diagnosis not present

## 2022-11-02 DIAGNOSIS — I10 Essential (primary) hypertension: Secondary | ICD-10-CM | POA: Diagnosis not present

## 2022-11-02 DIAGNOSIS — S065XAA Traumatic subdural hemorrhage with loss of consciousness status unknown, initial encounter: Secondary | ICD-10-CM | POA: Diagnosis not present

## 2022-11-02 DIAGNOSIS — S06360A Traumatic hemorrhage of cerebrum, unspecified, without loss of consciousness, initial encounter: Secondary | ICD-10-CM | POA: Diagnosis not present

## 2022-11-02 DIAGNOSIS — S199XXA Unspecified injury of neck, initial encounter: Secondary | ICD-10-CM | POA: Diagnosis not present

## 2022-11-02 DIAGNOSIS — S06310A Contusion and laceration of right cerebrum without loss of consciousness, initial encounter: Secondary | ICD-10-CM | POA: Diagnosis not present

## 2022-11-02 DIAGNOSIS — I471 Supraventricular tachycardia, unspecified: Secondary | ICD-10-CM | POA: Diagnosis not present

## 2022-11-02 DIAGNOSIS — R9431 Abnormal electrocardiogram [ECG] [EKG]: Secondary | ICD-10-CM | POA: Diagnosis not present

## 2022-11-02 DIAGNOSIS — S0990XA Unspecified injury of head, initial encounter: Secondary | ICD-10-CM | POA: Diagnosis not present

## 2022-11-03 DIAGNOSIS — R410 Disorientation, unspecified: Secondary | ICD-10-CM | POA: Diagnosis not present

## 2022-11-03 DIAGNOSIS — S99912A Unspecified injury of left ankle, initial encounter: Secondary | ICD-10-CM | POA: Diagnosis not present

## 2022-11-03 DIAGNOSIS — S299XXA Unspecified injury of thorax, initial encounter: Secondary | ICD-10-CM | POA: Diagnosis not present

## 2022-11-03 DIAGNOSIS — S065X0A Traumatic subdural hemorrhage without loss of consciousness, initial encounter: Secondary | ICD-10-CM | POA: Diagnosis not present

## 2022-11-03 DIAGNOSIS — S8992XA Unspecified injury of left lower leg, initial encounter: Secondary | ICD-10-CM | POA: Diagnosis not present

## 2022-11-03 DIAGNOSIS — W1830XA Fall on same level, unspecified, initial encounter: Secondary | ICD-10-CM | POA: Diagnosis not present

## 2022-11-03 DIAGNOSIS — E871 Hypo-osmolality and hyponatremia: Secondary | ICD-10-CM | POA: Diagnosis not present

## 2022-11-03 DIAGNOSIS — I6200 Nontraumatic subdural hemorrhage, unspecified: Secondary | ICD-10-CM | POA: Diagnosis not present

## 2022-11-03 DIAGNOSIS — F39 Unspecified mood [affective] disorder: Secondary | ICD-10-CM | POA: Diagnosis not present

## 2022-11-03 DIAGNOSIS — S0633AD Contusion and laceration of cerebrum, unspecified, with loss of consciousness status unknown, subsequent encounter: Secondary | ICD-10-CM | POA: Diagnosis not present

## 2022-11-03 DIAGNOSIS — S0633AA Contusion and laceration of cerebrum, unspecified, with loss of consciousness status unknown, initial encounter: Secondary | ICD-10-CM | POA: Diagnosis not present

## 2022-11-03 DIAGNOSIS — W19XXXA Unspecified fall, initial encounter: Secondary | ICD-10-CM | POA: Diagnosis not present

## 2022-11-03 DIAGNOSIS — F419 Anxiety disorder, unspecified: Secondary | ICD-10-CM | POA: Diagnosis not present

## 2022-11-03 DIAGNOSIS — S3993XA Unspecified injury of pelvis, initial encounter: Secondary | ICD-10-CM | POA: Diagnosis not present

## 2022-11-03 DIAGNOSIS — I619 Nontraumatic intracerebral hemorrhage, unspecified: Secondary | ICD-10-CM | POA: Diagnosis not present

## 2022-11-03 DIAGNOSIS — S06A0XA Traumatic brain compression without herniation, initial encounter: Secondary | ICD-10-CM | POA: Diagnosis not present

## 2022-11-03 DIAGNOSIS — S065XAA Traumatic subdural hemorrhage with loss of consciousness status unknown, initial encounter: Secondary | ICD-10-CM | POA: Diagnosis not present

## 2022-11-03 DIAGNOSIS — S0990XA Unspecified injury of head, initial encounter: Secondary | ICD-10-CM | POA: Diagnosis not present

## 2022-11-03 DIAGNOSIS — Z79899 Other long term (current) drug therapy: Secondary | ICD-10-CM | POA: Diagnosis not present

## 2022-11-03 DIAGNOSIS — Z043 Encounter for examination and observation following other accident: Secondary | ICD-10-CM | POA: Diagnosis not present

## 2022-11-03 DIAGNOSIS — S3991XA Unspecified injury of abdomen, initial encounter: Secondary | ICD-10-CM | POA: Diagnosis not present

## 2022-11-03 DIAGNOSIS — M25562 Pain in left knee: Secondary | ICD-10-CM | POA: Diagnosis not present

## 2022-11-03 DIAGNOSIS — I10 Essential (primary) hypertension: Secondary | ICD-10-CM | POA: Diagnosis not present

## 2022-11-03 DIAGNOSIS — E785 Hyperlipidemia, unspecified: Secondary | ICD-10-CM | POA: Diagnosis not present

## 2022-11-03 DIAGNOSIS — M25572 Pain in left ankle and joints of left foot: Secondary | ICD-10-CM | POA: Diagnosis not present

## 2022-11-03 DIAGNOSIS — W19XXXD Unspecified fall, subsequent encounter: Secondary | ICD-10-CM | POA: Diagnosis not present

## 2022-11-03 DIAGNOSIS — M25579 Pain in unspecified ankle and joints of unspecified foot: Secondary | ICD-10-CM | POA: Diagnosis not present

## 2022-11-03 DIAGNOSIS — F05 Delirium due to known physiological condition: Secondary | ICD-10-CM | POA: Diagnosis not present

## 2022-11-14 DIAGNOSIS — Z743 Need for continuous supervision: Secondary | ICD-10-CM | POA: Diagnosis not present

## 2022-11-14 DIAGNOSIS — N179 Acute kidney failure, unspecified: Secondary | ICD-10-CM | POA: Diagnosis not present

## 2022-11-14 DIAGNOSIS — I5023 Acute on chronic systolic (congestive) heart failure: Secondary | ICD-10-CM | POA: Diagnosis not present

## 2022-11-14 DIAGNOSIS — I639 Cerebral infarction, unspecified: Secondary | ICD-10-CM | POA: Diagnosis not present

## 2022-11-14 DIAGNOSIS — S0633AA Contusion and laceration of cerebrum, unspecified, with loss of consciousness status unknown, initial encounter: Secondary | ICD-10-CM | POA: Diagnosis not present

## 2022-11-14 DIAGNOSIS — I4891 Unspecified atrial fibrillation: Secondary | ICD-10-CM | POA: Diagnosis not present

## 2022-11-14 DIAGNOSIS — M6281 Muscle weakness (generalized): Secondary | ICD-10-CM | POA: Diagnosis not present

## 2022-11-14 DIAGNOSIS — I629 Nontraumatic intracranial hemorrhage, unspecified: Secondary | ICD-10-CM | POA: Diagnosis not present

## 2022-11-14 DIAGNOSIS — I10 Essential (primary) hypertension: Secondary | ICD-10-CM | POA: Diagnosis not present

## 2022-11-14 DIAGNOSIS — Z79899 Other long term (current) drug therapy: Secondary | ICD-10-CM | POA: Diagnosis not present

## 2022-11-14 DIAGNOSIS — S0634AD Traumatic hemorrhage of right cerebrum with loss of consciousness status unknown, subsequent encounter: Secondary | ICD-10-CM | POA: Diagnosis not present

## 2022-11-14 DIAGNOSIS — R2689 Other abnormalities of gait and mobility: Secondary | ICD-10-CM | POA: Diagnosis not present

## 2022-11-14 DIAGNOSIS — R1312 Dysphagia, oropharyngeal phase: Secondary | ICD-10-CM | POA: Diagnosis not present

## 2022-11-14 DIAGNOSIS — S069X9A Unspecified intracranial injury with loss of consciousness of unspecified duration, initial encounter: Secondary | ICD-10-CM | POA: Diagnosis not present

## 2022-11-14 DIAGNOSIS — D649 Anemia, unspecified: Secondary | ICD-10-CM | POA: Diagnosis not present

## 2022-11-14 DIAGNOSIS — W19XXXA Unspecified fall, initial encounter: Secondary | ICD-10-CM | POA: Diagnosis not present

## 2022-11-14 DIAGNOSIS — W1830XA Fall on same level, unspecified, initial encounter: Secondary | ICD-10-CM | POA: Diagnosis not present

## 2022-11-14 DIAGNOSIS — N390 Urinary tract infection, site not specified: Secondary | ICD-10-CM | POA: Diagnosis not present

## 2022-11-14 DIAGNOSIS — R339 Retention of urine, unspecified: Secondary | ICD-10-CM | POA: Diagnosis not present

## 2022-11-14 DIAGNOSIS — R0781 Pleurodynia: Secondary | ICD-10-CM | POA: Diagnosis not present

## 2022-11-14 DIAGNOSIS — R4189 Other symptoms and signs involving cognitive functions and awareness: Secondary | ICD-10-CM | POA: Diagnosis not present

## 2022-11-14 DIAGNOSIS — I69111 Memory deficit following nontraumatic intracerebral hemorrhage: Secondary | ICD-10-CM | POA: Diagnosis not present

## 2022-11-14 DIAGNOSIS — J690 Pneumonitis due to inhalation of food and vomit: Secondary | ICD-10-CM | POA: Diagnosis not present

## 2022-11-14 DIAGNOSIS — R531 Weakness: Secondary | ICD-10-CM | POA: Diagnosis not present

## 2022-12-01 DIAGNOSIS — R1312 Dysphagia, oropharyngeal phase: Secondary | ICD-10-CM | POA: Diagnosis not present

## 2022-12-01 DIAGNOSIS — I10 Essential (primary) hypertension: Secondary | ICD-10-CM | POA: Diagnosis not present

## 2022-12-01 DIAGNOSIS — Z9181 History of falling: Secondary | ICD-10-CM | POA: Diagnosis not present

## 2022-12-01 DIAGNOSIS — F419 Anxiety disorder, unspecified: Secondary | ICD-10-CM | POA: Diagnosis not present

## 2022-12-01 DIAGNOSIS — S065X0D Traumatic subdural hemorrhage without loss of consciousness, subsequent encounter: Secondary | ICD-10-CM | POA: Diagnosis not present

## 2022-12-01 DIAGNOSIS — F32A Depression, unspecified: Secondary | ICD-10-CM | POA: Diagnosis not present

## 2022-12-01 DIAGNOSIS — E785 Hyperlipidemia, unspecified: Secondary | ICD-10-CM | POA: Diagnosis not present

## 2022-12-01 DIAGNOSIS — Z87440 Personal history of urinary (tract) infections: Secondary | ICD-10-CM | POA: Diagnosis not present

## 2022-12-01 DIAGNOSIS — I4891 Unspecified atrial fibrillation: Secondary | ICD-10-CM | POA: Diagnosis not present

## 2022-12-02 DIAGNOSIS — Z7689 Persons encountering health services in other specified circumstances: Secondary | ICD-10-CM | POA: Diagnosis not present

## 2022-12-02 DIAGNOSIS — I619 Nontraumatic intracerebral hemorrhage, unspecified: Secondary | ICD-10-CM | POA: Diagnosis not present

## 2022-12-02 DIAGNOSIS — F419 Anxiety disorder, unspecified: Secondary | ICD-10-CM | POA: Diagnosis not present

## 2022-12-02 DIAGNOSIS — Z9181 History of falling: Secondary | ICD-10-CM | POA: Diagnosis not present

## 2022-12-02 DIAGNOSIS — E785 Hyperlipidemia, unspecified: Secondary | ICD-10-CM | POA: Diagnosis not present

## 2022-12-02 DIAGNOSIS — F32A Depression, unspecified: Secondary | ICD-10-CM | POA: Diagnosis not present

## 2022-12-02 DIAGNOSIS — Z87440 Personal history of urinary (tract) infections: Secondary | ICD-10-CM | POA: Diagnosis not present

## 2022-12-02 DIAGNOSIS — Z7189 Other specified counseling: Secondary | ICD-10-CM | POA: Diagnosis not present

## 2022-12-02 DIAGNOSIS — Z758 Other problems related to medical facilities and other health care: Secondary | ICD-10-CM | POA: Diagnosis not present

## 2022-12-02 DIAGNOSIS — R1312 Dysphagia, oropharyngeal phase: Secondary | ICD-10-CM | POA: Diagnosis not present

## 2022-12-02 DIAGNOSIS — I10 Essential (primary) hypertension: Secondary | ICD-10-CM | POA: Diagnosis not present

## 2022-12-02 DIAGNOSIS — S065X0D Traumatic subdural hemorrhage without loss of consciousness, subsequent encounter: Secondary | ICD-10-CM | POA: Diagnosis not present

## 2022-12-02 DIAGNOSIS — F39 Unspecified mood [affective] disorder: Secondary | ICD-10-CM | POA: Diagnosis not present

## 2022-12-02 DIAGNOSIS — R296 Repeated falls: Secondary | ICD-10-CM | POA: Diagnosis not present

## 2022-12-02 DIAGNOSIS — I4891 Unspecified atrial fibrillation: Secondary | ICD-10-CM | POA: Diagnosis not present

## 2022-12-03 DIAGNOSIS — I10 Essential (primary) hypertension: Secondary | ICD-10-CM | POA: Diagnosis not present

## 2022-12-03 DIAGNOSIS — Z9181 History of falling: Secondary | ICD-10-CM | POA: Diagnosis not present

## 2022-12-03 DIAGNOSIS — S065X0D Traumatic subdural hemorrhage without loss of consciousness, subsequent encounter: Secondary | ICD-10-CM | POA: Diagnosis not present

## 2022-12-03 DIAGNOSIS — E785 Hyperlipidemia, unspecified: Secondary | ICD-10-CM | POA: Diagnosis not present

## 2022-12-03 DIAGNOSIS — Z87440 Personal history of urinary (tract) infections: Secondary | ICD-10-CM | POA: Diagnosis not present

## 2022-12-03 DIAGNOSIS — R1312 Dysphagia, oropharyngeal phase: Secondary | ICD-10-CM | POA: Diagnosis not present

## 2022-12-03 DIAGNOSIS — I619 Nontraumatic intracerebral hemorrhage, unspecified: Secondary | ICD-10-CM | POA: Diagnosis not present

## 2022-12-03 DIAGNOSIS — F419 Anxiety disorder, unspecified: Secondary | ICD-10-CM | POA: Diagnosis not present

## 2022-12-03 DIAGNOSIS — I4891 Unspecified atrial fibrillation: Secondary | ICD-10-CM | POA: Diagnosis not present

## 2022-12-03 DIAGNOSIS — F32A Depression, unspecified: Secondary | ICD-10-CM | POA: Diagnosis not present

## 2022-12-04 DIAGNOSIS — F32A Depression, unspecified: Secondary | ICD-10-CM | POA: Diagnosis not present

## 2022-12-04 DIAGNOSIS — Z9181 History of falling: Secondary | ICD-10-CM | POA: Diagnosis not present

## 2022-12-04 DIAGNOSIS — Z87440 Personal history of urinary (tract) infections: Secondary | ICD-10-CM | POA: Diagnosis not present

## 2022-12-04 DIAGNOSIS — R1312 Dysphagia, oropharyngeal phase: Secondary | ICD-10-CM | POA: Diagnosis not present

## 2022-12-04 DIAGNOSIS — I10 Essential (primary) hypertension: Secondary | ICD-10-CM | POA: Diagnosis not present

## 2022-12-04 DIAGNOSIS — E785 Hyperlipidemia, unspecified: Secondary | ICD-10-CM | POA: Diagnosis not present

## 2022-12-04 DIAGNOSIS — S065X0D Traumatic subdural hemorrhage without loss of consciousness, subsequent encounter: Secondary | ICD-10-CM | POA: Diagnosis not present

## 2022-12-04 DIAGNOSIS — I4891 Unspecified atrial fibrillation: Secondary | ICD-10-CM | POA: Diagnosis not present

## 2022-12-04 DIAGNOSIS — F419 Anxiety disorder, unspecified: Secondary | ICD-10-CM | POA: Diagnosis not present

## 2022-12-05 ENCOUNTER — Ambulatory Visit: Payer: Medicare PPO | Admitting: Family Medicine

## 2022-12-05 DIAGNOSIS — R636 Underweight: Secondary | ICD-10-CM | POA: Diagnosis not present

## 2022-12-05 DIAGNOSIS — I619 Nontraumatic intracerebral hemorrhage, unspecified: Secondary | ICD-10-CM | POA: Diagnosis not present

## 2022-12-06 DIAGNOSIS — I499 Cardiac arrhythmia, unspecified: Secondary | ICD-10-CM | POA: Diagnosis not present

## 2022-12-06 DIAGNOSIS — I619 Nontraumatic intracerebral hemorrhage, unspecified: Secondary | ICD-10-CM | POA: Diagnosis not present

## 2022-12-06 DIAGNOSIS — R002 Palpitations: Secondary | ICD-10-CM | POA: Diagnosis not present

## 2022-12-06 DIAGNOSIS — I4891 Unspecified atrial fibrillation: Secondary | ICD-10-CM | POA: Diagnosis not present

## 2022-12-06 DIAGNOSIS — D692 Other nonthrombocytopenic purpura: Secondary | ICD-10-CM | POA: Diagnosis not present

## 2022-12-06 DIAGNOSIS — R54 Age-related physical debility: Secondary | ICD-10-CM | POA: Diagnosis not present

## 2022-12-06 DIAGNOSIS — E785 Hyperlipidemia, unspecified: Secondary | ICD-10-CM | POA: Diagnosis not present

## 2022-12-06 DIAGNOSIS — I10 Essential (primary) hypertension: Secondary | ICD-10-CM | POA: Diagnosis not present

## 2022-12-06 DIAGNOSIS — F39 Unspecified mood [affective] disorder: Secondary | ICD-10-CM | POA: Diagnosis not present

## 2022-12-09 DIAGNOSIS — Z87440 Personal history of urinary (tract) infections: Secondary | ICD-10-CM | POA: Diagnosis not present

## 2022-12-09 DIAGNOSIS — F419 Anxiety disorder, unspecified: Secondary | ICD-10-CM | POA: Diagnosis not present

## 2022-12-09 DIAGNOSIS — I619 Nontraumatic intracerebral hemorrhage, unspecified: Secondary | ICD-10-CM | POA: Diagnosis not present

## 2022-12-09 DIAGNOSIS — I10 Essential (primary) hypertension: Secondary | ICD-10-CM | POA: Diagnosis not present

## 2022-12-09 DIAGNOSIS — R1312 Dysphagia, oropharyngeal phase: Secondary | ICD-10-CM | POA: Diagnosis not present

## 2022-12-09 DIAGNOSIS — S065X0D Traumatic subdural hemorrhage without loss of consciousness, subsequent encounter: Secondary | ICD-10-CM | POA: Diagnosis not present

## 2022-12-09 DIAGNOSIS — F39 Unspecified mood [affective] disorder: Secondary | ICD-10-CM | POA: Diagnosis not present

## 2022-12-09 DIAGNOSIS — Z9181 History of falling: Secondary | ICD-10-CM | POA: Diagnosis not present

## 2022-12-09 DIAGNOSIS — E785 Hyperlipidemia, unspecified: Secondary | ICD-10-CM | POA: Diagnosis not present

## 2022-12-09 DIAGNOSIS — F32A Depression, unspecified: Secondary | ICD-10-CM | POA: Diagnosis not present

## 2022-12-09 DIAGNOSIS — I4891 Unspecified atrial fibrillation: Secondary | ICD-10-CM | POA: Diagnosis not present

## 2022-12-10 DIAGNOSIS — H53462 Homonymous bilateral field defects, left side: Secondary | ICD-10-CM | POA: Diagnosis not present

## 2022-12-10 DIAGNOSIS — H26493 Other secondary cataract, bilateral: Secondary | ICD-10-CM | POA: Diagnosis not present

## 2022-12-11 DIAGNOSIS — F32A Depression, unspecified: Secondary | ICD-10-CM | POA: Diagnosis not present

## 2022-12-11 DIAGNOSIS — S065X0D Traumatic subdural hemorrhage without loss of consciousness, subsequent encounter: Secondary | ICD-10-CM | POA: Diagnosis not present

## 2022-12-11 DIAGNOSIS — Z9181 History of falling: Secondary | ICD-10-CM | POA: Diagnosis not present

## 2022-12-11 DIAGNOSIS — F419 Anxiety disorder, unspecified: Secondary | ICD-10-CM | POA: Diagnosis not present

## 2022-12-11 DIAGNOSIS — E785 Hyperlipidemia, unspecified: Secondary | ICD-10-CM | POA: Diagnosis not present

## 2022-12-11 DIAGNOSIS — R1312 Dysphagia, oropharyngeal phase: Secondary | ICD-10-CM | POA: Diagnosis not present

## 2022-12-11 DIAGNOSIS — Z87440 Personal history of urinary (tract) infections: Secondary | ICD-10-CM | POA: Diagnosis not present

## 2022-12-11 DIAGNOSIS — I10 Essential (primary) hypertension: Secondary | ICD-10-CM | POA: Diagnosis not present

## 2022-12-11 DIAGNOSIS — I4891 Unspecified atrial fibrillation: Secondary | ICD-10-CM | POA: Diagnosis not present

## 2022-12-13 DIAGNOSIS — I272 Pulmonary hypertension, unspecified: Secondary | ICD-10-CM | POA: Diagnosis not present

## 2022-12-13 DIAGNOSIS — M25471 Effusion, right ankle: Secondary | ICD-10-CM | POA: Diagnosis not present

## 2022-12-13 DIAGNOSIS — I499 Cardiac arrhythmia, unspecified: Secondary | ICD-10-CM | POA: Diagnosis not present

## 2022-12-13 DIAGNOSIS — R002 Palpitations: Secondary | ICD-10-CM | POA: Diagnosis not present

## 2022-12-13 DIAGNOSIS — M25472 Effusion, left ankle: Secondary | ICD-10-CM | POA: Diagnosis not present

## 2022-12-13 DIAGNOSIS — G311 Senile degeneration of brain, not elsewhere classified: Secondary | ICD-10-CM | POA: Diagnosis not present

## 2022-12-16 DIAGNOSIS — R1312 Dysphagia, oropharyngeal phase: Secondary | ICD-10-CM | POA: Diagnosis not present

## 2022-12-16 DIAGNOSIS — Z87440 Personal history of urinary (tract) infections: Secondary | ICD-10-CM | POA: Diagnosis not present

## 2022-12-16 DIAGNOSIS — F32A Depression, unspecified: Secondary | ICD-10-CM | POA: Diagnosis not present

## 2022-12-16 DIAGNOSIS — I4891 Unspecified atrial fibrillation: Secondary | ICD-10-CM | POA: Diagnosis not present

## 2022-12-16 DIAGNOSIS — I10 Essential (primary) hypertension: Secondary | ICD-10-CM | POA: Diagnosis not present

## 2022-12-16 DIAGNOSIS — F419 Anxiety disorder, unspecified: Secondary | ICD-10-CM | POA: Diagnosis not present

## 2022-12-16 DIAGNOSIS — E785 Hyperlipidemia, unspecified: Secondary | ICD-10-CM | POA: Diagnosis not present

## 2022-12-16 DIAGNOSIS — S065X0D Traumatic subdural hemorrhage without loss of consciousness, subsequent encounter: Secondary | ICD-10-CM | POA: Diagnosis not present

## 2022-12-16 DIAGNOSIS — Z9181 History of falling: Secondary | ICD-10-CM | POA: Diagnosis not present

## 2022-12-17 DIAGNOSIS — I4891 Unspecified atrial fibrillation: Secondary | ICD-10-CM | POA: Diagnosis not present

## 2022-12-17 DIAGNOSIS — M25472 Effusion, left ankle: Secondary | ICD-10-CM | POA: Diagnosis not present

## 2022-12-17 DIAGNOSIS — F32A Depression, unspecified: Secondary | ICD-10-CM | POA: Diagnosis not present

## 2022-12-17 DIAGNOSIS — Z9181 History of falling: Secondary | ICD-10-CM | POA: Diagnosis not present

## 2022-12-17 DIAGNOSIS — R002 Palpitations: Secondary | ICD-10-CM | POA: Diagnosis not present

## 2022-12-17 DIAGNOSIS — G311 Senile degeneration of brain, not elsewhere classified: Secondary | ICD-10-CM | POA: Diagnosis not present

## 2022-12-17 DIAGNOSIS — M25471 Effusion, right ankle: Secondary | ICD-10-CM | POA: Diagnosis not present

## 2022-12-17 DIAGNOSIS — I499 Cardiac arrhythmia, unspecified: Secondary | ICD-10-CM | POA: Diagnosis not present

## 2022-12-17 DIAGNOSIS — R1312 Dysphagia, oropharyngeal phase: Secondary | ICD-10-CM | POA: Diagnosis not present

## 2022-12-17 DIAGNOSIS — E785 Hyperlipidemia, unspecified: Secondary | ICD-10-CM | POA: Diagnosis not present

## 2022-12-17 DIAGNOSIS — F419 Anxiety disorder, unspecified: Secondary | ICD-10-CM | POA: Diagnosis not present

## 2022-12-17 DIAGNOSIS — I10 Essential (primary) hypertension: Secondary | ICD-10-CM | POA: Diagnosis not present

## 2022-12-17 DIAGNOSIS — Z87440 Personal history of urinary (tract) infections: Secondary | ICD-10-CM | POA: Diagnosis not present

## 2022-12-17 DIAGNOSIS — S065X0D Traumatic subdural hemorrhage without loss of consciousness, subsequent encounter: Secondary | ICD-10-CM | POA: Diagnosis not present

## 2022-12-23 DIAGNOSIS — S065X0D Traumatic subdural hemorrhage without loss of consciousness, subsequent encounter: Secondary | ICD-10-CM | POA: Diagnosis not present

## 2022-12-23 DIAGNOSIS — F32A Depression, unspecified: Secondary | ICD-10-CM | POA: Diagnosis not present

## 2022-12-23 DIAGNOSIS — E785 Hyperlipidemia, unspecified: Secondary | ICD-10-CM | POA: Diagnosis not present

## 2022-12-23 DIAGNOSIS — I10 Essential (primary) hypertension: Secondary | ICD-10-CM | POA: Diagnosis not present

## 2022-12-23 DIAGNOSIS — R1312 Dysphagia, oropharyngeal phase: Secondary | ICD-10-CM | POA: Diagnosis not present

## 2022-12-23 DIAGNOSIS — I4891 Unspecified atrial fibrillation: Secondary | ICD-10-CM | POA: Diagnosis not present

## 2022-12-23 DIAGNOSIS — Z87440 Personal history of urinary (tract) infections: Secondary | ICD-10-CM | POA: Diagnosis not present

## 2022-12-23 DIAGNOSIS — F419 Anxiety disorder, unspecified: Secondary | ICD-10-CM | POA: Diagnosis not present

## 2022-12-23 DIAGNOSIS — Z9181 History of falling: Secondary | ICD-10-CM | POA: Diagnosis not present

## 2022-12-26 DIAGNOSIS — F32A Depression, unspecified: Secondary | ICD-10-CM | POA: Diagnosis not present

## 2022-12-26 DIAGNOSIS — I10 Essential (primary) hypertension: Secondary | ICD-10-CM | POA: Diagnosis not present

## 2022-12-26 DIAGNOSIS — E785 Hyperlipidemia, unspecified: Secondary | ICD-10-CM | POA: Diagnosis not present

## 2022-12-26 DIAGNOSIS — Z9181 History of falling: Secondary | ICD-10-CM | POA: Diagnosis not present

## 2022-12-26 DIAGNOSIS — Z87440 Personal history of urinary (tract) infections: Secondary | ICD-10-CM | POA: Diagnosis not present

## 2022-12-26 DIAGNOSIS — S065X0D Traumatic subdural hemorrhage without loss of consciousness, subsequent encounter: Secondary | ICD-10-CM | POA: Diagnosis not present

## 2022-12-26 DIAGNOSIS — F419 Anxiety disorder, unspecified: Secondary | ICD-10-CM | POA: Diagnosis not present

## 2022-12-26 DIAGNOSIS — I4891 Unspecified atrial fibrillation: Secondary | ICD-10-CM | POA: Diagnosis not present

## 2022-12-26 DIAGNOSIS — R1312 Dysphagia, oropharyngeal phase: Secondary | ICD-10-CM | POA: Diagnosis not present

## 2022-12-28 DIAGNOSIS — Z79899 Other long term (current) drug therapy: Secondary | ICD-10-CM | POA: Diagnosis not present

## 2022-12-28 DIAGNOSIS — E785 Hyperlipidemia, unspecified: Secondary | ICD-10-CM | POA: Diagnosis not present

## 2022-12-28 DIAGNOSIS — S52501A Unspecified fracture of the lower end of right radius, initial encounter for closed fracture: Secondary | ICD-10-CM | POA: Diagnosis not present

## 2022-12-28 DIAGNOSIS — W19XXXA Unspecified fall, initial encounter: Secondary | ICD-10-CM | POA: Diagnosis not present

## 2022-12-28 DIAGNOSIS — M25531 Pain in right wrist: Secondary | ICD-10-CM | POA: Diagnosis not present

## 2022-12-28 DIAGNOSIS — I10 Essential (primary) hypertension: Secondary | ICD-10-CM | POA: Diagnosis not present

## 2022-12-29 DIAGNOSIS — Z8639 Personal history of other endocrine, nutritional and metabolic disease: Secondary | ICD-10-CM | POA: Diagnosis not present

## 2022-12-29 DIAGNOSIS — S52601A Unspecified fracture of lower end of right ulna, initial encounter for closed fracture: Secondary | ICD-10-CM | POA: Diagnosis not present

## 2022-12-29 DIAGNOSIS — I10 Essential (primary) hypertension: Secondary | ICD-10-CM | POA: Diagnosis not present

## 2022-12-29 DIAGNOSIS — F411 Generalized anxiety disorder: Secondary | ICD-10-CM | POA: Diagnosis not present

## 2022-12-29 DIAGNOSIS — S52611A Displaced fracture of right ulna styloid process, initial encounter for closed fracture: Secondary | ICD-10-CM | POA: Diagnosis not present

## 2022-12-29 DIAGNOSIS — S52591A Other fractures of lower end of right radius, initial encounter for closed fracture: Secondary | ICD-10-CM | POA: Diagnosis not present

## 2022-12-29 DIAGNOSIS — R410 Disorientation, unspecified: Secondary | ICD-10-CM | POA: Diagnosis not present

## 2022-12-29 DIAGNOSIS — Z4689 Encounter for fitting and adjustment of other specified devices: Secondary | ICD-10-CM | POA: Diagnosis not present

## 2022-12-30 DIAGNOSIS — S52501A Unspecified fracture of the lower end of right radius, initial encounter for closed fracture: Secondary | ICD-10-CM | POA: Diagnosis not present

## 2022-12-30 DIAGNOSIS — I4891 Unspecified atrial fibrillation: Secondary | ICD-10-CM | POA: Diagnosis not present

## 2022-12-30 DIAGNOSIS — S52601A Unspecified fracture of lower end of right ulna, initial encounter for closed fracture: Secondary | ICD-10-CM | POA: Diagnosis not present

## 2022-12-30 DIAGNOSIS — F331 Major depressive disorder, recurrent, moderate: Secondary | ICD-10-CM | POA: Diagnosis not present

## 2022-12-30 DIAGNOSIS — I272 Pulmonary hypertension, unspecified: Secondary | ICD-10-CM | POA: Diagnosis not present

## 2022-12-30 DIAGNOSIS — F411 Generalized anxiety disorder: Secondary | ICD-10-CM | POA: Diagnosis not present

## 2022-12-30 DIAGNOSIS — S52531A Colles' fracture of right radius, initial encounter for closed fracture: Secondary | ICD-10-CM | POA: Diagnosis not present

## 2022-12-30 DIAGNOSIS — R296 Repeated falls: Secondary | ICD-10-CM | POA: Diagnosis not present

## 2022-12-30 DIAGNOSIS — R54 Age-related physical debility: Secondary | ICD-10-CM | POA: Diagnosis not present

## 2022-12-30 DIAGNOSIS — F39 Unspecified mood [affective] disorder: Secondary | ICD-10-CM | POA: Diagnosis not present

## 2022-12-30 DIAGNOSIS — E876 Hypokalemia: Secondary | ICD-10-CM | POA: Diagnosis not present

## 2022-12-30 DIAGNOSIS — I10 Essential (primary) hypertension: Secondary | ICD-10-CM | POA: Diagnosis not present

## 2022-12-30 DIAGNOSIS — Z9181 History of falling: Secondary | ICD-10-CM | POA: Diagnosis not present

## 2022-12-31 DIAGNOSIS — H26491 Other secondary cataract, right eye: Secondary | ICD-10-CM | POA: Diagnosis not present

## 2022-12-31 DIAGNOSIS — F419 Anxiety disorder, unspecified: Secondary | ICD-10-CM | POA: Diagnosis not present

## 2022-12-31 DIAGNOSIS — I10 Essential (primary) hypertension: Secondary | ICD-10-CM | POA: Diagnosis not present

## 2022-12-31 DIAGNOSIS — S065X0D Traumatic subdural hemorrhage without loss of consciousness, subsequent encounter: Secondary | ICD-10-CM | POA: Diagnosis not present

## 2022-12-31 DIAGNOSIS — R1312 Dysphagia, oropharyngeal phase: Secondary | ICD-10-CM | POA: Diagnosis not present

## 2022-12-31 DIAGNOSIS — E785 Hyperlipidemia, unspecified: Secondary | ICD-10-CM | POA: Diagnosis not present

## 2022-12-31 DIAGNOSIS — Z87440 Personal history of urinary (tract) infections: Secondary | ICD-10-CM | POA: Diagnosis not present

## 2022-12-31 DIAGNOSIS — Z9181 History of falling: Secondary | ICD-10-CM | POA: Diagnosis not present

## 2022-12-31 DIAGNOSIS — F32A Depression, unspecified: Secondary | ICD-10-CM | POA: Diagnosis not present

## 2022-12-31 DIAGNOSIS — I4891 Unspecified atrial fibrillation: Secondary | ICD-10-CM | POA: Diagnosis not present

## 2023-01-01 DIAGNOSIS — I491 Atrial premature depolarization: Secondary | ICD-10-CM | POA: Diagnosis not present

## 2023-01-01 DIAGNOSIS — I4891 Unspecified atrial fibrillation: Secondary | ICD-10-CM | POA: Diagnosis not present

## 2023-01-01 DIAGNOSIS — I361 Nonrheumatic tricuspid (valve) insufficiency: Secondary | ICD-10-CM | POA: Diagnosis not present

## 2023-01-01 DIAGNOSIS — I10 Essential (primary) hypertension: Secondary | ICD-10-CM | POA: Diagnosis not present

## 2023-01-01 DIAGNOSIS — I359 Nonrheumatic aortic valve disorder, unspecified: Secondary | ICD-10-CM | POA: Diagnosis not present

## 2023-01-02 DIAGNOSIS — S065X0D Traumatic subdural hemorrhage without loss of consciousness, subsequent encounter: Secondary | ICD-10-CM | POA: Diagnosis not present

## 2023-01-02 DIAGNOSIS — R1312 Dysphagia, oropharyngeal phase: Secondary | ICD-10-CM | POA: Diagnosis not present

## 2023-01-02 DIAGNOSIS — I10 Essential (primary) hypertension: Secondary | ICD-10-CM | POA: Diagnosis not present

## 2023-01-02 DIAGNOSIS — I4891 Unspecified atrial fibrillation: Secondary | ICD-10-CM | POA: Diagnosis not present

## 2023-01-02 DIAGNOSIS — E785 Hyperlipidemia, unspecified: Secondary | ICD-10-CM | POA: Diagnosis not present

## 2023-01-02 DIAGNOSIS — E876 Hypokalemia: Secondary | ICD-10-CM | POA: Diagnosis not present

## 2023-01-02 DIAGNOSIS — Z9181 History of falling: Secondary | ICD-10-CM | POA: Diagnosis not present

## 2023-01-02 DIAGNOSIS — Z87440 Personal history of urinary (tract) infections: Secondary | ICD-10-CM | POA: Diagnosis not present

## 2023-01-02 DIAGNOSIS — F32A Depression, unspecified: Secondary | ICD-10-CM | POA: Diagnosis not present

## 2023-01-02 DIAGNOSIS — F419 Anxiety disorder, unspecified: Secondary | ICD-10-CM | POA: Diagnosis not present

## 2023-01-06 DIAGNOSIS — M25471 Effusion, right ankle: Secondary | ICD-10-CM | POA: Diagnosis not present

## 2023-01-06 DIAGNOSIS — S52501A Unspecified fracture of the lower end of right radius, initial encounter for closed fracture: Secondary | ICD-10-CM | POA: Diagnosis not present

## 2023-01-06 DIAGNOSIS — I272 Pulmonary hypertension, unspecified: Secondary | ICD-10-CM | POA: Diagnosis not present

## 2023-01-06 DIAGNOSIS — S52601A Unspecified fracture of lower end of right ulna, initial encounter for closed fracture: Secondary | ICD-10-CM | POA: Diagnosis not present

## 2023-01-06 DIAGNOSIS — G311 Senile degeneration of brain, not elsewhere classified: Secondary | ICD-10-CM | POA: Diagnosis not present

## 2023-01-06 DIAGNOSIS — M25472 Effusion, left ankle: Secondary | ICD-10-CM | POA: Diagnosis not present

## 2023-01-07 DIAGNOSIS — I4891 Unspecified atrial fibrillation: Secondary | ICD-10-CM | POA: Diagnosis not present

## 2023-01-07 DIAGNOSIS — S065X0D Traumatic subdural hemorrhage without loss of consciousness, subsequent encounter: Secondary | ICD-10-CM | POA: Diagnosis not present

## 2023-01-07 DIAGNOSIS — F339 Major depressive disorder, recurrent, unspecified: Secondary | ICD-10-CM | POA: Diagnosis not present

## 2023-01-07 DIAGNOSIS — F419 Anxiety disorder, unspecified: Secondary | ICD-10-CM | POA: Diagnosis not present

## 2023-01-07 DIAGNOSIS — E785 Hyperlipidemia, unspecified: Secondary | ICD-10-CM | POA: Diagnosis not present

## 2023-01-07 DIAGNOSIS — I10 Essential (primary) hypertension: Secondary | ICD-10-CM | POA: Diagnosis not present

## 2023-01-07 DIAGNOSIS — F32A Depression, unspecified: Secondary | ICD-10-CM | POA: Diagnosis not present

## 2023-01-07 DIAGNOSIS — R1312 Dysphagia, oropharyngeal phase: Secondary | ICD-10-CM | POA: Diagnosis not present

## 2023-01-07 DIAGNOSIS — Z87440 Personal history of urinary (tract) infections: Secondary | ICD-10-CM | POA: Diagnosis not present

## 2023-01-07 DIAGNOSIS — F39 Unspecified mood [affective] disorder: Secondary | ICD-10-CM | POA: Diagnosis not present

## 2023-01-07 DIAGNOSIS — Z9181 History of falling: Secondary | ICD-10-CM | POA: Diagnosis not present

## 2023-01-07 DIAGNOSIS — R4181 Age-related cognitive decline: Secondary | ICD-10-CM | POA: Diagnosis not present

## 2023-01-07 DIAGNOSIS — Z7689 Persons encountering health services in other specified circumstances: Secondary | ICD-10-CM | POA: Diagnosis not present

## 2023-01-08 DIAGNOSIS — S52531D Colles' fracture of right radius, subsequent encounter for closed fracture with routine healing: Secondary | ICD-10-CM | POA: Diagnosis not present

## 2023-01-13 DIAGNOSIS — S062XAA Diffuse traumatic brain injury with loss of consciousness status unknown, initial encounter: Secondary | ICD-10-CM | POA: Diagnosis not present

## 2023-01-13 DIAGNOSIS — G936 Cerebral edema: Secondary | ICD-10-CM | POA: Diagnosis not present

## 2023-01-14 DIAGNOSIS — H26492 Other secondary cataract, left eye: Secondary | ICD-10-CM | POA: Diagnosis not present

## 2023-01-16 DIAGNOSIS — S52601A Unspecified fracture of lower end of right ulna, initial encounter for closed fracture: Secondary | ICD-10-CM | POA: Diagnosis not present

## 2023-01-16 DIAGNOSIS — I4891 Unspecified atrial fibrillation: Secondary | ICD-10-CM | POA: Diagnosis not present

## 2023-01-16 DIAGNOSIS — S52501A Unspecified fracture of the lower end of right radius, initial encounter for closed fracture: Secondary | ICD-10-CM | POA: Diagnosis not present

## 2023-01-16 DIAGNOSIS — I610 Nontraumatic intracerebral hemorrhage in hemisphere, subcortical: Secondary | ICD-10-CM | POA: Diagnosis not present

## 2023-01-16 DIAGNOSIS — E785 Hyperlipidemia, unspecified: Secondary | ICD-10-CM | POA: Diagnosis not present

## 2023-01-22 DIAGNOSIS — S52531D Colles' fracture of right radius, subsequent encounter for closed fracture with routine healing: Secondary | ICD-10-CM | POA: Diagnosis not present

## 2023-01-23 DIAGNOSIS — R062 Wheezing: Secondary | ICD-10-CM | POA: Diagnosis not present

## 2023-01-23 DIAGNOSIS — S065X0D Traumatic subdural hemorrhage without loss of consciousness, subsequent encounter: Secondary | ICD-10-CM | POA: Diagnosis not present

## 2023-01-23 DIAGNOSIS — F39 Unspecified mood [affective] disorder: Secondary | ICD-10-CM | POA: Diagnosis not present

## 2023-01-23 DIAGNOSIS — G311 Senile degeneration of brain, not elsewhere classified: Secondary | ICD-10-CM | POA: Diagnosis not present

## 2023-01-23 DIAGNOSIS — G40A09 Absence epileptic syndrome, not intractable, without status epilepticus: Secondary | ICD-10-CM | POA: Diagnosis not present

## 2023-01-23 DIAGNOSIS — I272 Pulmonary hypertension, unspecified: Secondary | ICD-10-CM | POA: Diagnosis not present

## 2023-01-23 DIAGNOSIS — R4181 Age-related cognitive decline: Secondary | ICD-10-CM | POA: Diagnosis not present

## 2023-01-23 DIAGNOSIS — F339 Major depressive disorder, recurrent, unspecified: Secondary | ICD-10-CM | POA: Diagnosis not present

## 2023-01-24 DIAGNOSIS — R918 Other nonspecific abnormal finding of lung field: Secondary | ICD-10-CM | POA: Diagnosis not present

## 2023-01-25 DIAGNOSIS — S0101XA Laceration without foreign body of scalp, initial encounter: Secondary | ICD-10-CM | POA: Diagnosis not present

## 2023-01-25 DIAGNOSIS — W01190A Fall on same level from slipping, tripping and stumbling with subsequent striking against furniture, initial encounter: Secondary | ICD-10-CM | POA: Diagnosis not present

## 2023-01-25 DIAGNOSIS — I4891 Unspecified atrial fibrillation: Secondary | ICD-10-CM | POA: Diagnosis not present

## 2023-01-25 DIAGNOSIS — S0990XA Unspecified injury of head, initial encounter: Secondary | ICD-10-CM | POA: Diagnosis not present

## 2023-01-25 DIAGNOSIS — S1191XA Laceration without foreign body of unspecified part of neck, initial encounter: Secondary | ICD-10-CM | POA: Diagnosis not present

## 2023-01-25 DIAGNOSIS — S0191XA Laceration without foreign body of unspecified part of head, initial encounter: Secondary | ICD-10-CM | POA: Diagnosis not present

## 2023-01-25 DIAGNOSIS — R9431 Abnormal electrocardiogram [ECG] [EKG]: Secondary | ICD-10-CM | POA: Diagnosis not present

## 2023-01-25 DIAGNOSIS — R4182 Altered mental status, unspecified: Secondary | ICD-10-CM | POA: Diagnosis not present

## 2023-01-28 DIAGNOSIS — F411 Generalized anxiety disorder: Secondary | ICD-10-CM | POA: Diagnosis not present

## 2023-01-29 DIAGNOSIS — R278 Other lack of coordination: Secondary | ICD-10-CM | POA: Diagnosis not present

## 2023-01-29 DIAGNOSIS — S065X0S Traumatic subdural hemorrhage without loss of consciousness, sequela: Secondary | ICD-10-CM | POA: Diagnosis not present

## 2023-01-29 DIAGNOSIS — Z7189 Other specified counseling: Secondary | ICD-10-CM | POA: Diagnosis not present

## 2023-01-29 DIAGNOSIS — R410 Disorientation, unspecified: Secondary | ICD-10-CM | POA: Diagnosis not present

## 2023-01-29 DIAGNOSIS — S52601A Unspecified fracture of lower end of right ulna, initial encounter for closed fracture: Secondary | ICD-10-CM | POA: Diagnosis not present

## 2023-01-29 DIAGNOSIS — G40A09 Absence epileptic syndrome, not intractable, without status epilepticus: Secondary | ICD-10-CM | POA: Diagnosis not present

## 2023-01-29 DIAGNOSIS — S52501A Unspecified fracture of the lower end of right radius, initial encounter for closed fracture: Secondary | ICD-10-CM | POA: Diagnosis not present

## 2023-01-29 DIAGNOSIS — F03918 Unspecified dementia, unspecified severity, with other behavioral disturbance: Secondary | ICD-10-CM | POA: Diagnosis not present

## 2023-01-29 DIAGNOSIS — J189 Pneumonia, unspecified organism: Secondary | ICD-10-CM | POA: Diagnosis not present

## 2023-01-29 DIAGNOSIS — R41844 Frontal lobe and executive function deficit: Secondary | ICD-10-CM | POA: Diagnosis not present

## 2023-01-30 ENCOUNTER — Telehealth: Payer: Self-pay | Admitting: Family Medicine

## 2023-01-30 DIAGNOSIS — F331 Major depressive disorder, recurrent, moderate: Secondary | ICD-10-CM | POA: Diagnosis not present

## 2023-01-30 DIAGNOSIS — F411 Generalized anxiety disorder: Secondary | ICD-10-CM | POA: Diagnosis not present

## 2023-01-30 DIAGNOSIS — I10 Essential (primary) hypertension: Secondary | ICD-10-CM | POA: Diagnosis not present

## 2023-01-30 NOTE — Telephone Encounter (Signed)
I called patient to reschedule AWV. I spoke to patient's son and he said patient has moved to [place].   Thank you,  [facility] Support [facility] Direct dial  [phone]

## 2023-01-31 DIAGNOSIS — R41844 Frontal lobe and executive function deficit: Secondary | ICD-10-CM | POA: Diagnosis not present

## 2023-01-31 DIAGNOSIS — G40A09 Absence epileptic syndrome, not intractable, without status epilepticus: Secondary | ICD-10-CM | POA: Diagnosis not present

## 2023-01-31 DIAGNOSIS — I4891 Unspecified atrial fibrillation: Secondary | ICD-10-CM | POA: Diagnosis not present

## 2023-01-31 DIAGNOSIS — S52501A Unspecified fracture of the lower end of right radius, initial encounter for closed fracture: Secondary | ICD-10-CM | POA: Diagnosis not present

## 2023-01-31 DIAGNOSIS — J189 Pneumonia, unspecified organism: Secondary | ICD-10-CM | POA: Diagnosis not present

## 2023-01-31 DIAGNOSIS — B379 Candidiasis, unspecified: Secondary | ICD-10-CM | POA: Diagnosis not present

## 2023-01-31 DIAGNOSIS — S52601A Unspecified fracture of lower end of right ulna, initial encounter for closed fracture: Secondary | ICD-10-CM | POA: Diagnosis not present

## 2023-01-31 DIAGNOSIS — R278 Other lack of coordination: Secondary | ICD-10-CM | POA: Diagnosis not present

## 2023-01-31 DIAGNOSIS — R410 Disorientation, unspecified: Secondary | ICD-10-CM | POA: Diagnosis not present

## 2023-01-31 DIAGNOSIS — S065X0S Traumatic subdural hemorrhage without loss of consciousness, sequela: Secondary | ICD-10-CM | POA: Diagnosis not present

## 2023-02-01 DIAGNOSIS — R2681 Unsteadiness on feet: Secondary | ICD-10-CM | POA: Diagnosis not present

## 2023-02-01 DIAGNOSIS — S065X0S Traumatic subdural hemorrhage without loss of consciousness, sequela: Secondary | ICD-10-CM | POA: Diagnosis not present

## 2023-02-03 DIAGNOSIS — R41844 Frontal lobe and executive function deficit: Secondary | ICD-10-CM | POA: Diagnosis not present

## 2023-02-03 DIAGNOSIS — Z111 Encounter for screening for respiratory tuberculosis: Secondary | ICD-10-CM | POA: Diagnosis not present

## 2023-02-03 DIAGNOSIS — R262 Difficulty in walking, not elsewhere classified: Secondary | ICD-10-CM | POA: Diagnosis not present

## 2023-02-03 DIAGNOSIS — F03918 Unspecified dementia, unspecified severity, with other behavioral disturbance: Secondary | ICD-10-CM | POA: Diagnosis not present

## 2023-02-03 DIAGNOSIS — R32 Unspecified urinary incontinence: Secondary | ICD-10-CM | POA: Diagnosis not present

## 2023-02-03 DIAGNOSIS — G40A09 Absence epileptic syndrome, not intractable, without status epilepticus: Secondary | ICD-10-CM | POA: Diagnosis not present

## 2023-02-03 DIAGNOSIS — R441 Visual hallucinations: Secondary | ICD-10-CM | POA: Diagnosis not present

## 2023-02-03 DIAGNOSIS — R159 Full incontinence of feces: Secondary | ICD-10-CM | POA: Diagnosis not present

## 2023-02-03 DIAGNOSIS — R278 Other lack of coordination: Secondary | ICD-10-CM | POA: Diagnosis not present

## 2023-02-03 DIAGNOSIS — S065X0S Traumatic subdural hemorrhage without loss of consciousness, sequela: Secondary | ICD-10-CM | POA: Diagnosis not present

## 2023-02-04 DIAGNOSIS — S065X0S Traumatic subdural hemorrhage without loss of consciousness, sequela: Secondary | ICD-10-CM | POA: Diagnosis not present

## 2023-02-04 DIAGNOSIS — R410 Disorientation, unspecified: Secondary | ICD-10-CM | POA: Diagnosis not present

## 2023-02-04 DIAGNOSIS — Z111 Encounter for screening for respiratory tuberculosis: Secondary | ICD-10-CM | POA: Diagnosis not present

## 2023-02-04 DIAGNOSIS — R2681 Unsteadiness on feet: Secondary | ICD-10-CM | POA: Diagnosis not present

## 2023-02-05 DIAGNOSIS — R2681 Unsteadiness on feet: Secondary | ICD-10-CM | POA: Diagnosis not present

## 2023-02-05 DIAGNOSIS — S065X0S Traumatic subdural hemorrhage without loss of consciousness, sequela: Secondary | ICD-10-CM | POA: Diagnosis not present

## 2023-02-06 DIAGNOSIS — R278 Other lack of coordination: Secondary | ICD-10-CM | POA: Diagnosis not present

## 2023-02-06 DIAGNOSIS — R41844 Frontal lobe and executive function deficit: Secondary | ICD-10-CM | POA: Diagnosis not present

## 2023-02-06 DIAGNOSIS — S065X0S Traumatic subdural hemorrhage without loss of consciousness, sequela: Secondary | ICD-10-CM | POA: Diagnosis not present

## 2023-02-07 DIAGNOSIS — R410 Disorientation, unspecified: Secondary | ICD-10-CM | POA: Diagnosis not present

## 2023-02-07 DIAGNOSIS — R2681 Unsteadiness on feet: Secondary | ICD-10-CM | POA: Diagnosis not present

## 2023-02-07 DIAGNOSIS — S065X0S Traumatic subdural hemorrhage without loss of consciousness, sequela: Secondary | ICD-10-CM | POA: Diagnosis not present

## 2023-02-10 DIAGNOSIS — S52501A Unspecified fracture of the lower end of right radius, initial encounter for closed fracture: Secondary | ICD-10-CM | POA: Diagnosis not present

## 2023-02-10 DIAGNOSIS — R54 Age-related physical debility: Secondary | ICD-10-CM | POA: Diagnosis not present

## 2023-02-10 DIAGNOSIS — M25471 Effusion, right ankle: Secondary | ICD-10-CM | POA: Diagnosis not present

## 2023-02-10 DIAGNOSIS — M25472 Effusion, left ankle: Secondary | ICD-10-CM | POA: Diagnosis not present

## 2023-02-10 DIAGNOSIS — R262 Difficulty in walking, not elsewhere classified: Secondary | ICD-10-CM | POA: Diagnosis not present

## 2023-02-10 DIAGNOSIS — R32 Unspecified urinary incontinence: Secondary | ICD-10-CM | POA: Diagnosis not present

## 2023-02-10 DIAGNOSIS — S52601A Unspecified fracture of lower end of right ulna, initial encounter for closed fracture: Secondary | ICD-10-CM | POA: Diagnosis not present

## 2023-02-10 DIAGNOSIS — R2681 Unsteadiness on feet: Secondary | ICD-10-CM | POA: Diagnosis not present

## 2023-02-10 DIAGNOSIS — F03918 Unspecified dementia, unspecified severity, with other behavioral disturbance: Secondary | ICD-10-CM | POA: Diagnosis not present

## 2023-02-10 DIAGNOSIS — S065X0S Traumatic subdural hemorrhage without loss of consciousness, sequela: Secondary | ICD-10-CM | POA: Diagnosis not present

## 2023-02-10 DIAGNOSIS — R159 Full incontinence of feces: Secondary | ICD-10-CM | POA: Diagnosis not present

## 2023-02-11 DIAGNOSIS — S065X0S Traumatic subdural hemorrhage without loss of consciousness, sequela: Secondary | ICD-10-CM | POA: Diagnosis not present

## 2023-02-11 DIAGNOSIS — R278 Other lack of coordination: Secondary | ICD-10-CM | POA: Diagnosis not present

## 2023-02-11 DIAGNOSIS — R41844 Frontal lobe and executive function deficit: Secondary | ICD-10-CM | POA: Diagnosis not present

## 2023-02-12 DIAGNOSIS — R2681 Unsteadiness on feet: Secondary | ICD-10-CM | POA: Diagnosis not present

## 2023-02-12 DIAGNOSIS — S065X0S Traumatic subdural hemorrhage without loss of consciousness, sequela: Secondary | ICD-10-CM | POA: Diagnosis not present

## 2023-02-12 DIAGNOSIS — S52531D Colles' fracture of right radius, subsequent encounter for closed fracture with routine healing: Secondary | ICD-10-CM | POA: Diagnosis not present

## 2023-02-13 DIAGNOSIS — R41844 Frontal lobe and executive function deficit: Secondary | ICD-10-CM | POA: Diagnosis not present

## 2023-02-13 DIAGNOSIS — S065X0S Traumatic subdural hemorrhage without loss of consciousness, sequela: Secondary | ICD-10-CM | POA: Diagnosis not present

## 2023-02-13 DIAGNOSIS — R278 Other lack of coordination: Secondary | ICD-10-CM | POA: Diagnosis not present

## 2023-02-14 DIAGNOSIS — R2681 Unsteadiness on feet: Secondary | ICD-10-CM | POA: Diagnosis not present

## 2023-02-14 DIAGNOSIS — S065X0S Traumatic subdural hemorrhage without loss of consciousness, sequela: Secondary | ICD-10-CM | POA: Diagnosis not present

## 2023-02-17 DIAGNOSIS — R54 Age-related physical debility: Secondary | ICD-10-CM | POA: Diagnosis not present

## 2023-02-17 DIAGNOSIS — F03918 Unspecified dementia, unspecified severity, with other behavioral disturbance: Secondary | ICD-10-CM | POA: Diagnosis not present

## 2023-02-17 DIAGNOSIS — I272 Pulmonary hypertension, unspecified: Secondary | ICD-10-CM | POA: Diagnosis not present

## 2023-02-17 DIAGNOSIS — S065X0S Traumatic subdural hemorrhage without loss of consciousness, sequela: Secondary | ICD-10-CM | POA: Diagnosis not present

## 2023-02-17 DIAGNOSIS — R32 Unspecified urinary incontinence: Secondary | ICD-10-CM | POA: Diagnosis not present

## 2023-02-17 DIAGNOSIS — R159 Full incontinence of feces: Secondary | ICD-10-CM | POA: Diagnosis not present

## 2023-02-17 DIAGNOSIS — R2681 Unsteadiness on feet: Secondary | ICD-10-CM | POA: Diagnosis not present

## 2023-02-17 DIAGNOSIS — G40A09 Absence epileptic syndrome, not intractable, without status epilepticus: Secondary | ICD-10-CM | POA: Diagnosis not present

## 2023-02-18 DIAGNOSIS — R278 Other lack of coordination: Secondary | ICD-10-CM | POA: Diagnosis not present

## 2023-02-18 DIAGNOSIS — R41844 Frontal lobe and executive function deficit: Secondary | ICD-10-CM | POA: Diagnosis not present

## 2023-02-18 DIAGNOSIS — I272 Pulmonary hypertension, unspecified: Secondary | ICD-10-CM | POA: Diagnosis not present

## 2023-02-18 DIAGNOSIS — I340 Nonrheumatic mitral (valve) insufficiency: Secondary | ICD-10-CM | POA: Diagnosis not present

## 2023-02-18 DIAGNOSIS — I358 Other nonrheumatic aortic valve disorders: Secondary | ICD-10-CM | POA: Diagnosis not present

## 2023-02-18 DIAGNOSIS — I517 Cardiomegaly: Secondary | ICD-10-CM | POA: Diagnosis not present

## 2023-02-18 DIAGNOSIS — I361 Nonrheumatic tricuspid (valve) insufficiency: Secondary | ICD-10-CM | POA: Diagnosis not present

## 2023-02-18 DIAGNOSIS — S065X0S Traumatic subdural hemorrhage without loss of consciousness, sequela: Secondary | ICD-10-CM | POA: Diagnosis not present

## 2023-02-18 DIAGNOSIS — I351 Nonrheumatic aortic (valve) insufficiency: Secondary | ICD-10-CM | POA: Diagnosis not present

## 2023-02-19 DIAGNOSIS — S065X0S Traumatic subdural hemorrhage without loss of consciousness, sequela: Secondary | ICD-10-CM | POA: Diagnosis not present

## 2023-02-19 DIAGNOSIS — R2681 Unsteadiness on feet: Secondary | ICD-10-CM | POA: Diagnosis not present

## 2023-02-20 DIAGNOSIS — R41844 Frontal lobe and executive function deficit: Secondary | ICD-10-CM | POA: Diagnosis not present

## 2023-02-20 DIAGNOSIS — S065X0S Traumatic subdural hemorrhage without loss of consciousness, sequela: Secondary | ICD-10-CM | POA: Diagnosis not present

## 2023-02-20 DIAGNOSIS — R278 Other lack of coordination: Secondary | ICD-10-CM | POA: Diagnosis not present

## 2023-02-22 DIAGNOSIS — S065X0S Traumatic subdural hemorrhage without loss of consciousness, sequela: Secondary | ICD-10-CM | POA: Diagnosis not present

## 2023-02-22 DIAGNOSIS — R2681 Unsteadiness on feet: Secondary | ICD-10-CM | POA: Diagnosis not present

## 2023-02-24 DIAGNOSIS — F339 Major depressive disorder, recurrent, unspecified: Secondary | ICD-10-CM | POA: Diagnosis not present

## 2023-02-24 DIAGNOSIS — R32 Unspecified urinary incontinence: Secondary | ICD-10-CM | POA: Diagnosis not present

## 2023-02-24 DIAGNOSIS — S065X0S Traumatic subdural hemorrhage without loss of consciousness, sequela: Secondary | ICD-10-CM | POA: Diagnosis not present

## 2023-02-24 DIAGNOSIS — I4891 Unspecified atrial fibrillation: Secondary | ICD-10-CM | POA: Diagnosis not present

## 2023-02-24 DIAGNOSIS — F03918 Unspecified dementia, unspecified severity, with other behavioral disturbance: Secondary | ICD-10-CM | POA: Diagnosis not present

## 2023-02-24 DIAGNOSIS — R2681 Unsteadiness on feet: Secondary | ICD-10-CM | POA: Diagnosis not present

## 2023-02-24 DIAGNOSIS — F39 Unspecified mood [affective] disorder: Secondary | ICD-10-CM | POA: Diagnosis not present

## 2023-02-24 DIAGNOSIS — R159 Full incontinence of feces: Secondary | ICD-10-CM | POA: Diagnosis not present

## 2023-02-24 DIAGNOSIS — R443 Hallucinations, unspecified: Secondary | ICD-10-CM | POA: Diagnosis not present

## 2023-02-24 DIAGNOSIS — Z7189 Other specified counseling: Secondary | ICD-10-CM | POA: Diagnosis not present

## 2023-02-26 DIAGNOSIS — R278 Other lack of coordination: Secondary | ICD-10-CM | POA: Diagnosis not present

## 2023-02-26 DIAGNOSIS — R41844 Frontal lobe and executive function deficit: Secondary | ICD-10-CM | POA: Diagnosis not present

## 2023-02-26 DIAGNOSIS — S065X0S Traumatic subdural hemorrhage without loss of consciousness, sequela: Secondary | ICD-10-CM | POA: Diagnosis not present

## 2023-02-26 DIAGNOSIS — R2681 Unsteadiness on feet: Secondary | ICD-10-CM | POA: Diagnosis not present

## 2023-02-26 DIAGNOSIS — R8290 Unspecified abnormal findings in urine: Secondary | ICD-10-CM | POA: Diagnosis not present

## 2023-02-28 DIAGNOSIS — R41844 Frontal lobe and executive function deficit: Secondary | ICD-10-CM | POA: Diagnosis not present

## 2023-02-28 DIAGNOSIS — R2681 Unsteadiness on feet: Secondary | ICD-10-CM | POA: Diagnosis not present

## 2023-02-28 DIAGNOSIS — F411 Generalized anxiety disorder: Secondary | ICD-10-CM | POA: Diagnosis not present

## 2023-02-28 DIAGNOSIS — R278 Other lack of coordination: Secondary | ICD-10-CM | POA: Diagnosis not present

## 2023-02-28 DIAGNOSIS — S065X0S Traumatic subdural hemorrhage without loss of consciousness, sequela: Secondary | ICD-10-CM | POA: Diagnosis not present

## 2023-03-01 DIAGNOSIS — I10 Essential (primary) hypertension: Secondary | ICD-10-CM | POA: Diagnosis not present

## 2023-03-01 DIAGNOSIS — F411 Generalized anxiety disorder: Secondary | ICD-10-CM | POA: Diagnosis not present

## 2023-03-01 DIAGNOSIS — F331 Major depressive disorder, recurrent, moderate: Secondary | ICD-10-CM | POA: Diagnosis not present

## 2023-03-03 DIAGNOSIS — G40A09 Absence epileptic syndrome, not intractable, without status epilepticus: Secondary | ICD-10-CM | POA: Diagnosis not present

## 2023-03-03 DIAGNOSIS — F39 Unspecified mood [affective] disorder: Secondary | ICD-10-CM | POA: Diagnosis not present

## 2023-03-03 DIAGNOSIS — B9620 Unspecified Escherichia coli [E. coli] as the cause of diseases classified elsewhere: Secondary | ICD-10-CM | POA: Diagnosis not present

## 2023-03-03 DIAGNOSIS — F03918 Unspecified dementia, unspecified severity, with other behavioral disturbance: Secondary | ICD-10-CM | POA: Diagnosis not present

## 2023-03-03 DIAGNOSIS — R6889 Other general symptoms and signs: Secondary | ICD-10-CM | POA: Diagnosis not present

## 2023-03-03 DIAGNOSIS — F339 Major depressive disorder, recurrent, unspecified: Secondary | ICD-10-CM | POA: Diagnosis not present

## 2023-03-03 DIAGNOSIS — N390 Urinary tract infection, site not specified: Secondary | ICD-10-CM | POA: Diagnosis not present

## 2023-03-03 DIAGNOSIS — G311 Senile degeneration of brain, not elsewhere classified: Secondary | ICD-10-CM | POA: Diagnosis not present

## 2023-03-04 DIAGNOSIS — R41844 Frontal lobe and executive function deficit: Secondary | ICD-10-CM | POA: Diagnosis not present

## 2023-03-04 DIAGNOSIS — S065X0S Traumatic subdural hemorrhage without loss of consciousness, sequela: Secondary | ICD-10-CM | POA: Diagnosis not present

## 2023-03-04 DIAGNOSIS — R278 Other lack of coordination: Secondary | ICD-10-CM | POA: Diagnosis not present

## 2023-03-10 DIAGNOSIS — G40A09 Absence epileptic syndrome, not intractable, without status epilepticus: Secondary | ICD-10-CM | POA: Diagnosis not present

## 2023-03-10 DIAGNOSIS — I272 Pulmonary hypertension, unspecified: Secondary | ICD-10-CM | POA: Diagnosis not present

## 2023-03-10 DIAGNOSIS — F03918 Unspecified dementia, unspecified severity, with other behavioral disturbance: Secondary | ICD-10-CM | POA: Diagnosis not present

## 2023-03-10 DIAGNOSIS — F39 Unspecified mood [affective] disorder: Secondary | ICD-10-CM | POA: Diagnosis not present

## 2023-03-13 DIAGNOSIS — I4891 Unspecified atrial fibrillation: Secondary | ICD-10-CM | POA: Diagnosis not present

## 2023-03-13 DIAGNOSIS — F03918 Unspecified dementia, unspecified severity, with other behavioral disturbance: Secondary | ICD-10-CM | POA: Diagnosis not present

## 2023-03-13 DIAGNOSIS — E785 Hyperlipidemia, unspecified: Secondary | ICD-10-CM | POA: Diagnosis not present

## 2023-03-13 DIAGNOSIS — M25471 Effusion, right ankle: Secondary | ICD-10-CM | POA: Diagnosis not present

## 2023-03-13 DIAGNOSIS — M25472 Effusion, left ankle: Secondary | ICD-10-CM | POA: Diagnosis not present

## 2023-03-17 DIAGNOSIS — F03918 Unspecified dementia, unspecified severity, with other behavioral disturbance: Secondary | ICD-10-CM | POA: Diagnosis not present

## 2023-03-17 DIAGNOSIS — E785 Hyperlipidemia, unspecified: Secondary | ICD-10-CM | POA: Diagnosis not present

## 2023-03-17 DIAGNOSIS — Z789 Other specified health status: Secondary | ICD-10-CM | POA: Diagnosis not present

## 2023-03-17 DIAGNOSIS — I4891 Unspecified atrial fibrillation: Secondary | ICD-10-CM | POA: Diagnosis not present

## 2023-03-17 DIAGNOSIS — M25471 Effusion, right ankle: Secondary | ICD-10-CM | POA: Diagnosis not present

## 2023-03-17 DIAGNOSIS — M25472 Effusion, left ankle: Secondary | ICD-10-CM | POA: Diagnosis not present

## 2023-03-21 DIAGNOSIS — Z9181 History of falling: Secondary | ICD-10-CM | POA: Diagnosis not present

## 2023-03-21 DIAGNOSIS — Z603 Acculturation difficulty: Secondary | ICD-10-CM | POA: Diagnosis not present

## 2023-03-21 DIAGNOSIS — R296 Repeated falls: Secondary | ICD-10-CM | POA: Diagnosis not present

## 2023-03-21 DIAGNOSIS — F03918 Unspecified dementia, unspecified severity, with other behavioral disturbance: Secondary | ICD-10-CM | POA: Diagnosis not present

## 2023-03-21 DIAGNOSIS — Z5982 Transportation insecurity: Secondary | ICD-10-CM | POA: Diagnosis not present

## 2023-03-21 DIAGNOSIS — I4891 Unspecified atrial fibrillation: Secondary | ICD-10-CM | POA: Diagnosis not present

## 2023-03-21 DIAGNOSIS — Z593 Problems related to living in residential institution: Secondary | ICD-10-CM | POA: Diagnosis not present

## 2023-03-21 DIAGNOSIS — Z515 Encounter for palliative care: Secondary | ICD-10-CM | POA: Diagnosis not present

## 2023-03-21 DIAGNOSIS — E46 Unspecified protein-calorie malnutrition: Secondary | ICD-10-CM | POA: Diagnosis not present

## 2023-03-31 DIAGNOSIS — G40A09 Absence epileptic syndrome, not intractable, without status epilepticus: Secondary | ICD-10-CM | POA: Diagnosis not present

## 2023-03-31 DIAGNOSIS — I272 Pulmonary hypertension, unspecified: Secondary | ICD-10-CM | POA: Diagnosis not present

## 2023-03-31 DIAGNOSIS — F03918 Unspecified dementia, unspecified severity, with other behavioral disturbance: Secondary | ICD-10-CM | POA: Diagnosis not present

## 2023-03-31 DIAGNOSIS — R441 Visual hallucinations: Secondary | ICD-10-CM | POA: Diagnosis not present

## 2023-03-31 DIAGNOSIS — I4891 Unspecified atrial fibrillation: Secondary | ICD-10-CM | POA: Diagnosis not present

## 2023-04-01 DIAGNOSIS — F331 Major depressive disorder, recurrent, moderate: Secondary | ICD-10-CM | POA: Diagnosis not present

## 2023-04-01 DIAGNOSIS — F411 Generalized anxiety disorder: Secondary | ICD-10-CM | POA: Diagnosis not present

## 2023-04-01 DIAGNOSIS — I10 Essential (primary) hypertension: Secondary | ICD-10-CM | POA: Diagnosis not present

## 2023-04-14 DIAGNOSIS — Z111 Encounter for screening for respiratory tuberculosis: Secondary | ICD-10-CM | POA: Diagnosis not present

## 2023-05-31 DEATH — deceased
